# Patient Record
Sex: Male | Born: 1935 | Race: White | Hispanic: No | Marital: Married | Smoking: Former smoker
Health system: Southern US, Community
[De-identification: ages and names within clinical notes are randomized; demographics above are authoritative.]

## PROBLEM LIST (undated history)

## (undated) DIAGNOSIS — K635 Polyp of colon: Secondary | ICD-10-CM

## (undated) DIAGNOSIS — I679 Cerebrovascular disease, unspecified: Secondary | ICD-10-CM

## (undated) DIAGNOSIS — J449 Chronic obstructive pulmonary disease, unspecified: Secondary | ICD-10-CM

## (undated) DIAGNOSIS — I739 Peripheral vascular disease, unspecified: Secondary | ICD-10-CM

## (undated) DIAGNOSIS — E785 Hyperlipidemia, unspecified: Secondary | ICD-10-CM

## (undated) DIAGNOSIS — S065X9A Traumatic subdural hemorrhage with loss of consciousness of unspecified duration, initial encounter: Secondary | ICD-10-CM

## (undated) DIAGNOSIS — M1990 Unspecified osteoarthritis, unspecified site: Secondary | ICD-10-CM

## (undated) DIAGNOSIS — S065XAA Traumatic subdural hemorrhage with loss of consciousness status unknown, initial encounter: Secondary | ICD-10-CM

## (undated) DIAGNOSIS — Z9289 Personal history of other medical treatment: Secondary | ICD-10-CM

## (undated) DIAGNOSIS — I10 Essential (primary) hypertension: Secondary | ICD-10-CM

## (undated) HISTORY — DX: Peripheral vascular disease, unspecified: I73.9

## (undated) HISTORY — DX: Personal history of other medical treatment: Z92.89

## (undated) HISTORY — DX: Unspecified osteoarthritis, unspecified site: M19.90

## (undated) HISTORY — DX: Hyperlipidemia, unspecified: E78.5

## (undated) HISTORY — DX: Chronic obstructive pulmonary disease, unspecified: J44.9

## (undated) HISTORY — DX: Polyp of colon: K63.5

## (undated) HISTORY — DX: Traumatic subdural hemorrhage with loss of consciousness of unspecified duration, initial encounter: S06.5X9A

## (undated) HISTORY — DX: Traumatic subdural hemorrhage with loss of consciousness status unknown, initial encounter: S06.5XAA

## (undated) HISTORY — DX: Cerebrovascular disease, unspecified: I67.9

## (undated) HISTORY — DX: Essential (primary) hypertension: I10

---

## 1998-08-10 ENCOUNTER — Ambulatory Visit (HOSPITAL_COMMUNITY): Admission: RE | Admit: 1998-08-10 | Discharge: 1998-08-10 | Payer: Self-pay | Admitting: Family Medicine

## 2000-05-30 ENCOUNTER — Encounter: Payer: Self-pay | Admitting: Family Medicine

## 2000-05-30 ENCOUNTER — Encounter: Admission: RE | Admit: 2000-05-30 | Discharge: 2000-05-30 | Payer: Self-pay | Admitting: Family Medicine

## 2000-08-20 ENCOUNTER — Ambulatory Visit (HOSPITAL_BASED_OUTPATIENT_CLINIC_OR_DEPARTMENT_OTHER): Admission: RE | Admit: 2000-08-20 | Discharge: 2000-08-20 | Payer: Self-pay | Admitting: Surgery

## 2002-01-07 ENCOUNTER — Ambulatory Visit (HOSPITAL_COMMUNITY): Admission: RE | Admit: 2002-01-07 | Discharge: 2002-01-07 | Payer: Self-pay | Admitting: Urology

## 2002-01-07 ENCOUNTER — Encounter: Payer: Self-pay | Admitting: Urology

## 2003-05-11 ENCOUNTER — Encounter: Payer: Self-pay | Admitting: Urology

## 2003-05-13 ENCOUNTER — Encounter (INDEPENDENT_AMBULATORY_CARE_PROVIDER_SITE_OTHER): Payer: Self-pay | Admitting: Specialist

## 2003-05-13 ENCOUNTER — Inpatient Hospital Stay (HOSPITAL_COMMUNITY): Admission: RE | Admit: 2003-05-13 | Discharge: 2003-05-16 | Payer: Self-pay | Admitting: Urology

## 2003-10-17 ENCOUNTER — Encounter: Admission: RE | Admit: 2003-10-17 | Discharge: 2003-10-17 | Payer: Self-pay | Admitting: Family Medicine

## 2003-10-31 ENCOUNTER — Encounter: Admission: RE | Admit: 2003-10-31 | Discharge: 2003-10-31 | Payer: Self-pay | Admitting: Family Medicine

## 2004-01-27 ENCOUNTER — Encounter: Admission: RE | Admit: 2004-01-27 | Discharge: 2004-01-27 | Payer: Self-pay | Admitting: Family Medicine

## 2006-09-11 ENCOUNTER — Ambulatory Visit: Payer: Self-pay | Admitting: Cardiovascular Disease

## 2006-09-12 ENCOUNTER — Ambulatory Visit (HOSPITAL_COMMUNITY): Admission: RE | Admit: 2006-09-12 | Discharge: 2006-09-13 | Payer: Self-pay | Admitting: Cardiovascular Disease

## 2006-09-12 ENCOUNTER — Ambulatory Visit: Payer: Self-pay | Admitting: Cardiovascular Disease

## 2006-09-26 ENCOUNTER — Ambulatory Visit: Payer: Self-pay | Admitting: Cardiovascular Disease

## 2006-11-24 ENCOUNTER — Ambulatory Visit: Payer: Self-pay | Admitting: Cardiovascular Disease

## 2006-11-24 LAB — CONVERTED CEMR LAB
ALT: 35 units/L (ref 0–40)
Albumin: 3.7 g/dL (ref 3.5–5.2)
Alkaline Phosphatase: 66 units/L (ref 39–117)
LDL Cholesterol: 53 mg/dL (ref 0–99)
Total CHOL/HDL Ratio: 2.2
Triglycerides: 60 mg/dL (ref 0–149)
VLDL: 12 mg/dL (ref 0–40)

## 2007-03-12 ENCOUNTER — Ambulatory Visit: Payer: Self-pay | Admitting: Cardiovascular Disease

## 2007-09-21 ENCOUNTER — Ambulatory Visit: Payer: Self-pay | Admitting: Cardiovascular Disease

## 2007-11-19 ENCOUNTER — Ambulatory Visit: Payer: Self-pay | Admitting: Internal Medicine

## 2007-11-19 DIAGNOSIS — Z87442 Personal history of urinary calculi: Secondary | ICD-10-CM

## 2007-11-19 DIAGNOSIS — I259 Chronic ischemic heart disease, unspecified: Secondary | ICD-10-CM

## 2007-11-19 DIAGNOSIS — Z8546 Personal history of malignant neoplasm of prostate: Secondary | ICD-10-CM

## 2007-11-19 DIAGNOSIS — E785 Hyperlipidemia, unspecified: Secondary | ICD-10-CM | POA: Insufficient documentation

## 2007-12-07 ENCOUNTER — Ambulatory Visit: Payer: Self-pay | Admitting: Internal Medicine

## 2008-01-11 ENCOUNTER — Ambulatory Visit: Payer: Self-pay | Admitting: Cardiovascular Disease

## 2008-01-11 LAB — CONVERTED CEMR LAB
ALT: 25 units/L (ref 0–53)
AST: 27 units/L (ref 0–37)
Albumin: 3.9 g/dL (ref 3.5–5.2)
Alkaline Phosphatase: 57 units/L (ref 39–117)
Bilirubin, Direct: 0.2 mg/dL (ref 0.0–0.3)
Cholesterol: 131 mg/dL (ref 0–200)
Total Protein: 7.2 g/dL (ref 6.0–8.3)
Triglycerides: 43 mg/dL (ref 0–149)
VLDL: 9 mg/dL (ref 0–40)

## 2008-04-04 ENCOUNTER — Ambulatory Visit: Payer: Self-pay | Admitting: Cardiovascular Disease

## 2008-07-11 ENCOUNTER — Ambulatory Visit: Payer: Self-pay

## 2008-09-13 ENCOUNTER — Ambulatory Visit: Payer: Self-pay | Admitting: Cardiovascular Disease

## 2009-04-19 DIAGNOSIS — I10 Essential (primary) hypertension: Secondary | ICD-10-CM | POA: Insufficient documentation

## 2009-04-19 DIAGNOSIS — I209 Angina pectoris, unspecified: Secondary | ICD-10-CM

## 2009-04-24 ENCOUNTER — Ambulatory Visit: Payer: Self-pay | Admitting: Cardiovascular Disease

## 2009-05-01 ENCOUNTER — Ambulatory Visit: Payer: Self-pay | Admitting: Cardiovascular Disease

## 2009-05-01 LAB — CONVERTED CEMR LAB
Albumin: 3.8 g/dL (ref 3.5–5.2)
Bilirubin, Direct: 0.2 mg/dL (ref 0.0–0.3)
Cholesterol: 139 mg/dL (ref 0–200)
LDL Cholesterol: 74 mg/dL (ref 0–99)
Total CHOL/HDL Ratio: 3
Total Protein: 6.9 g/dL (ref 6.0–8.3)
VLDL: 12.2 mg/dL (ref 0.0–40.0)

## 2009-07-31 ENCOUNTER — Ambulatory Visit: Payer: Self-pay | Admitting: Cardiology

## 2009-07-31 ENCOUNTER — Telehealth: Payer: Self-pay | Admitting: Cardiovascular Disease

## 2009-07-31 ENCOUNTER — Inpatient Hospital Stay (HOSPITAL_COMMUNITY): Admission: EM | Admit: 2009-07-31 | Discharge: 2009-08-02 | Payer: Self-pay | Admitting: Emergency Medicine

## 2009-08-01 HISTORY — PX: CARDIAC CATHETERIZATION: SHX172

## 2009-08-15 ENCOUNTER — Encounter: Payer: Self-pay | Admitting: Cardiology

## 2009-08-16 ENCOUNTER — Encounter (INDEPENDENT_AMBULATORY_CARE_PROVIDER_SITE_OTHER): Payer: Self-pay | Admitting: *Deleted

## 2009-08-18 ENCOUNTER — Ambulatory Visit: Payer: Self-pay | Admitting: Cardiology

## 2009-08-18 ENCOUNTER — Encounter: Payer: Self-pay | Admitting: Nurse Practitioner

## 2009-08-18 DIAGNOSIS — R12 Heartburn: Secondary | ICD-10-CM

## 2009-08-24 ENCOUNTER — Encounter (HOSPITAL_COMMUNITY): Admission: RE | Admit: 2009-08-24 | Discharge: 2009-10-06 | Payer: Self-pay | Admitting: Cardiovascular Disease

## 2009-09-04 ENCOUNTER — Encounter: Payer: Self-pay | Admitting: Cardiovascular Disease

## 2009-09-13 ENCOUNTER — Encounter: Payer: Self-pay | Admitting: Cardiovascular Disease

## 2009-10-03 ENCOUNTER — Encounter: Payer: Self-pay | Admitting: Cardiovascular Disease

## 2009-10-07 ENCOUNTER — Encounter (HOSPITAL_COMMUNITY): Admission: RE | Admit: 2009-10-07 | Discharge: 2009-11-17 | Payer: Self-pay | Admitting: Cardiovascular Disease

## 2009-10-23 ENCOUNTER — Encounter: Payer: Self-pay | Admitting: Cardiovascular Disease

## 2009-11-08 ENCOUNTER — Encounter: Payer: Self-pay | Admitting: Cardiovascular Disease

## 2009-11-20 ENCOUNTER — Ambulatory Visit: Payer: Self-pay | Admitting: Cardiovascular Disease

## 2010-03-12 ENCOUNTER — Encounter: Payer: Self-pay | Admitting: Cardiovascular Disease

## 2010-03-12 ENCOUNTER — Encounter: Admission: RE | Admit: 2010-03-12 | Discharge: 2010-03-12 | Payer: Self-pay | Admitting: Family Medicine

## 2010-03-13 ENCOUNTER — Ambulatory Visit: Payer: Self-pay | Admitting: Cardiovascular Disease

## 2010-03-13 DIAGNOSIS — R079 Chest pain, unspecified: Secondary | ICD-10-CM | POA: Insufficient documentation

## 2010-03-15 ENCOUNTER — Telehealth (INDEPENDENT_AMBULATORY_CARE_PROVIDER_SITE_OTHER): Payer: Self-pay | Admitting: *Deleted

## 2010-03-19 ENCOUNTER — Encounter: Payer: Self-pay | Admitting: Cardiovascular Disease

## 2010-03-19 ENCOUNTER — Encounter (HOSPITAL_COMMUNITY): Admission: RE | Admit: 2010-03-19 | Discharge: 2010-04-03 | Payer: Self-pay | Admitting: Cardiovascular Disease

## 2010-03-19 ENCOUNTER — Ambulatory Visit: Payer: Self-pay | Admitting: Cardiovascular Disease

## 2010-03-19 ENCOUNTER — Ambulatory Visit: Payer: Self-pay

## 2010-03-21 ENCOUNTER — Ambulatory Visit: Payer: Self-pay | Admitting: Cardiovascular Disease

## 2010-03-21 ENCOUNTER — Ambulatory Visit (HOSPITAL_COMMUNITY): Admission: RE | Admit: 2010-03-21 | Discharge: 2010-03-21 | Payer: Self-pay | Admitting: Cardiovascular Disease

## 2010-03-21 HISTORY — PX: CARDIAC CATHETERIZATION: SHX172

## 2010-04-23 ENCOUNTER — Encounter: Payer: Self-pay | Admitting: Cardiovascular Disease

## 2010-09-18 ENCOUNTER — Ambulatory Visit: Payer: Self-pay | Admitting: Cardiovascular Disease

## 2010-09-18 ENCOUNTER — Encounter: Payer: Self-pay | Admitting: Cardiovascular Disease

## 2010-11-08 NOTE — Letter (Signed)
Summary: Cardiac Catheterization Instructions- Main Lab  [employer], Main Office  [address]   [place], [place] [address]   Phone: [phone]  Fax: [phone]     03/19/2010 MRN: [id number]  [name] Marks [address] [name], [place]  [address]  Dear [name] Marks,   You are scheduled for Cardiac Catheterization on Wednesday March 21, 2010              with Dr. Excell Seltzer.  Please arrive at the [facility] at 6:30      a.m. on the day of your procedure.  1. DIET     _X___ Nothing to eat or drink after midnight except your medications with a sip of water.  2. MAKE SURE YOU TAKE YOUR ASPIRIN AND PLAVIX.  3. __X__ YOU MAY TAKE ALL of your remaining medications with a small amount of water.       4. Plan for one night stay - bring personal belongings (i.e. toothpaste, toothbrush, etc.)  5. Bring a current list of your medications and current insurance cards.  6. Must have a responsible person to drive you home.   7. Someone must be with you for the first 24 hours after you arrive home.  8. Please wear clothes that are easy to get on and off and wear slip-on shoes.  *Special note: Every effort is made to have your procedure done on time.  Occasionally there are emergencies that present themselves at the hospital that may cause delays.  Please be patient if a delay does occur.  If you have any questions after you get home, please call the office at the number listed above.  Julieta Gutting, RN, BSN

## 2010-11-08 NOTE — Progress Notes (Signed)
Summary: Nuclear Pre-Procedure  Phone Note Outgoing Call Call back at cell [phone]   Call placed by: Stanton Kidney, EMT-P,  March 15, 2010 12:13 PM Call placed to: Patient Action Taken: Phone Call Completed Summary of Call: Reviewed information on Myoview Information Sheet (see scanned document for further details).  Spoke with Patient.    Nuclear Med Background Indications for Stress Test: Evaluation for Ischemia, Stent Patency   History: Heart Catheterization, Myocardial Perfusion Study, Stents  History Comments: 10/09 MPS: EF= 66%, (+) EKG, 10/10 Heart Cath > Stents: LAD x2  Symptoms: Chest Tightness    Nuclear Pre-Procedure Cardiac Risk Factors: Hypertension, Lipids Height (in): 70

## 2010-11-08 NOTE — Assessment & Plan Note (Signed)
Summary: rov/ atypical chestpain   Visit Type:  Follow-up Primary Provider:  Maryelizabeth Rowan  CC:  chest tightness.  History of Present Illness: 76 y/o cauc. male with h/o CAD, s/p PCI and DES to the mid LAD with placement of a 2.5 x 28mm Xience DES on 08/01/2009.  Previously placed distal LAD bms had minimal restenosis, while the distal RCA had an 80% discrete lesion that has been medically managed.   He denies exertional chest pain, but has had 3 recent episodes of nocturnal chest tightness, predominately in the central and right chest. These symptoms are similar to those of his prior angina. He has not taken NTG. Episodes have lasted at least 20 minutes each. However, he was able to mow his lawn with a self-propelled push mower this week without any chest pain or tightness. He has stable exertional dyspnea, no edema, lightheadedness or other complaints.  Current Medications (verified): 1)  Metoprolol Tartrate 25 Mg Tabs (Metoprolol Tartrate) .... Take One Tablet By Mouth Twice A Day 2)  Simvastatin 40 Mg Tabs (Simvastatin) .... Take One Tablet By Mouth Daily At Bedtime 3)  Aspirin 81 Mg Tbec (Aspirin) .... Take One Tablet By Mouth Daily 4)  Vitamin D3 1000 Unit Caps (Cholecalciferol) .... Take 1 Capsule By Mouth Once A Day 5)  Plavix 75 Mg Tabs (Clopidogrel Bisulfate) .... Take One Tablet By Mouth Daily 6)  Pepcid 20 Mg Tabs (Famotidine) .... Take 1 Tab By Mouth Twice Daily 7)  Citracal Plus  Tabs (Multiple Minerals-Vitamins) .... Take 1 Tablet By Mouth Two Times A Day 8)  Calcium 400mg  Tablet .... Take 1 Tablet By Mouth Two Times A Day  Allergies (verified): No Known Drug Allergies  Past History:  Past medical history reviewed for relevance to current acute and chronic problems.  Past Medical History: Reviewed history from 08/18/2009 and no changes required. ANGINA, CHRONIC (ICD-413.9)Class II CORONARY ARTERY DISEASE, S/P PTCA (ICD-414.9)      A.  s/p bms to distal LAD in 2007      B.  s/p Xience DES to mid LAD 07/2009.  Mimimal ISR in previously placed distal LAD stent @ time of cath.            80% distal RCA stenosis - med rx. HYPERTENSION (ICD-401.9) DYSLIPIDEMIA (ICD-272.4) NEPHROLITHIASIS, HX OF (ICD-V13.01) NEOPLASM, MALIGNANT, PROSTATE, HX OF (ICD-V10.46) DYSPEPSIA  Review of Systems       Negative except as per HPI   Vital Signs:  Patient profile:   76 year old male Height:      70 inches Weight:      213 pounds BMI:     30.67 Pulse rate:   67 / minute Pulse rhythm:   regular Resp:     18 per minute BP sitting:   132 / 68  (left arm) Cuff size:   large  Vitals Entered By: Vikki Ports (March 13, 2010 2:56 PM)  Physical Exam  General:  Pt is alert and oriented, in no acute distress. HEENT: normal Neck: normal carotid upstrokes without bruits, JVP normal Lungs: CTA CV: RRR without murmur or gallop Abd: soft, NT, positive BS, no bruit, no organomegaly Ext: no clubbing, cyanosis, or edema. peripheral pulses 2+ and equal Skin: warm and dry without rash    EKG  Procedure date:  03/13/2010  Findings:      NSR, within normal limits, HR 67 bpm.  Impression & Recommendations:  Problem # 1:  CHEST PAIN UNSPECIFIED (ICD-786.50) Pt with known CAD  and recurrent angina, but only episodic nocturnal pain. Uncertain if this is truly recurrent angina. Recommend exercise myoview stress study to rule out significant ischemia and further risk stratify. I think relook cath without a noninvasive study may be a bit premature.  His updated medication list for this problem includes:    Metoprolol Tartrate 25 Mg Tabs (Metoprolol tartrate) .Marland Kitchen... Take one tablet by mouth twice a day    Aspirin 81 Mg Tbec (Aspirin) .Marland Kitchen... Take one tablet by mouth daily    Plavix 75 Mg Tabs (Clopidogrel bisulfate) .Marland Kitchen... Take one tablet by mouth daily  Orders: EKG w/ Interpretation (93[address]) Nuclear Stress Test (Nuc Stress Test)  Problem # 2:  HYPERTENSION  (ICD-401.9) Stable, controlled.  His updated medication list for this problem includes:    Metoprolol Tartrate 25 Mg Tabs (Metoprolol tartrate) .Marland Kitchen... Take one tablet by mouth twice a day    Aspirin 81 Mg Tbec (Aspirin) .Marland Kitchen... Take one tablet by mouth daily  Orders: EKG w/ Interpretation (93000) Nuclear Stress Test (Nuc Stress Test)  BP today: 132/68 Prior BP: 110/60 (11/20/2009)  Labs Reviewed: Chol: 139 (05/01/2009)   HDL: 53.30 (05/01/2009)   LDL: 74 (05/01/2009)   TG: 61.0 (05/01/2009)  Problem # 3:  DYSLIPIDEMIA (ICD-272.4) Controlled. LDL last year at goal (see below). His updated medication list for this problem includes:    Simvastatin 40 Mg Tabs (Simvastatin) .Marland Kitchen... Take one tablet by mouth daily at bedtime  CHOL: 139 (05/01/2009)   LDL: 74 (05/01/2009)   HDL: 53.30 (05/01/2009)   TG: 61.0 (05/01/2009)  Patient Instructions: 1)  Your physician wants you to follow-up in: 6 MONTHS.  You will receive a reminder letter in the mail two months in advance. If you don't receive a letter, please call our office to schedule the follow-up appointment. 2)  Your physician has requested that you have an exercise stress myoview.  For further information please visit [email or web address]  Please follow instruction sheet, as given.

## 2010-11-08 NOTE — Assessment & Plan Note (Signed)
Summary: per check out   Visit Type:  6 months follow up Primary Provider:  Maryelizabeth Rowan  CC:  No cardiac complaints.  History of Present Illness: 76 y/o cauc. male with h/o CAD, s/p PCI and DES to the mid LAD with placement of a 2.5 x 28mm Xience DES on 08/01/2009.  Previously placed distal LAD bms had minimal restenosis, while the distal RCA had an 80% discrete lesion that has been medically managed.   He presents today for followup evaluation. Denies chest pain or pressure. He reports stable exertional dyspnea with raking leaves or doing yardwork. He exercises at least 5 days per week (does two separate 15 minute sessions). No edema, palpitations, or other complaints.   Current Medications (verified): 1)  Metoprolol Tartrate 25 Mg Tabs (Metoprolol Tartrate) .... Take One Tablet By Mouth Twice A Day 2)  Simvastatin 40 Mg Tabs (Simvastatin) .... Take One Tablet By Mouth Daily At Bedtime 3)  Aspirin 81 Mg Tbec (Aspirin) .... Take One Tablet By Mouth Daily 4)  Vitamin D3 1000 Unit Caps (Cholecalciferol) .... Take 1 Capsule By Mouth Once A Day 5)  Plavix 75 Mg Tabs (Clopidogrel Bisulfate) .... Take One Tablet By Mouth Daily 6)  Citracal Plus  Tabs (Multiple Minerals-Vitamins) .... Take 1 Tablet By Mouth Two Times A Day 7)  Calcium 400mg  Tablet .... Take 1 Tablet By Mouth Two Times A Day  Allergies (verified): No Known Drug Allergies  Past History:  Past medical history reviewed for relevance to current acute and chronic problems.  Past Medical History: Reviewed history from 08/18/2009 and no changes required. ANGINA, CHRONIC (ICD-413.9)Class II CORONARY ARTERY DISEASE, S/P PTCA (ICD-414.9)      A.  s/p bms to distal LAD in 2007      B.  s/p Xience DES to mid LAD 07/2009.  Mimimal ISR in previously placed distal LAD stent @ time of cath.            80% distal RCA stenosis - med rx. HYPERTENSION (ICD-401.9) DYSLIPIDEMIA (ICD-272.4) NEPHROLITHIASIS, HX OF (ICD-V13.01) NEOPLASM,  MALIGNANT, PROSTATE, HX OF (ICD-V10.46) DYSPEPSIA  Review of Systems       Negative except as per HPI   Vital Signs:  Patient profile:   76 year old male Height:      70 inches Weight:      218.25 pounds BMI:     31.43 Pulse rate:   54 / minute Pulse rhythm:   regular Resp:     18 per minute BP sitting:   140 / 72  (left arm) Cuff size:   large  Vitals Entered By: Vikki Ports (September 18, 2010 9:25 AM)  Physical Exam  General:  Pt is alert and oriented, in no acute distress. HEENT: normal Neck: normal carotid upstrokes without bruits, JVP normal Lungs: CTA CV: RRR without murmur or gallop Abd: soft, NT, positive BS, no bruit, no organomegaly Ext: no clubbing, cyanosis, or edema. peripheral pulses 2+ and equal Skin: warm and dry without rash    EKG  Procedure date:  09/18/2010  Findings:      Sinus bradycardia 54 bpm, within normal limits.  Impression & Recommendations:  Problem # 1:  CORONARY ARTERY DISEASE, S/P PTCA (ICD-414.9) Pt stable without angina. We reviewed risk:benefit for continued dual antiplatelet Rx after 12 months and decided to discontinue plavix. He is at low risk of late stent thrombosis as he was treated with a single DES (second generation) outside of the setting of ACS. Otherwise will  continue with current medical management.  The following medications were removed from the medication list:    Plavix 75 Mg Tabs (Clopidogrel bisulfate) .Marland Kitchen... Take one tablet by mouth daily His updated medication list for this problem includes:    Metoprolol Tartrate 25 Mg Tabs (Metoprolol tartrate) .Marland Kitchen... Take one tablet by mouth twice a day    Aspirin 81 Mg Tbec (Aspirin) .Marland Kitchen... Take one tablet by mouth daily  Problem # 2:  HYPERTENSION (ICD-401.9) Reports home BP's ranging around 135/80.Marland KitchenMarland KitchenI asked him to continue recording and bring readings into next visit. He will call if readings consistently above 140 mmHg.  His updated medication list for this problem  includes:    Metoprolol Tartrate 25 Mg Tabs (Metoprolol tartrate) .Marland Kitchen... Take one tablet by mouth twice a day    Aspirin 81 Mg Tbec (Aspirin) .Marland Kitchen... Take one tablet by mouth daily  BP today: 140/72 Prior BP: 132/68 (03/13/2010)  Labs Reviewed: Chol: 139 (05/01/2009)   HDL: 53.30 (05/01/2009)   LDL: 74 (05/01/2009)   TG: 61.0 (05/01/2009)  Problem # 3:  DYSLIPIDEMIA (ICD-272.4) Labs reviewed from July 2011: Total chol 142, LDL 67, HDL 65, Trig 52  His updated medication list for this problem includes:    Simvastatin 40 Mg Tabs (Simvastatin) .Marland Kitchen... Take one tablet by mouth daily at bedtime  CHOL: 139 (05/01/2009)   LDL: 74 (05/01/2009)   HDL: 53.30 (05/01/2009)   TG: 61.0 (05/01/2009)  Patient Instructions: 1)  Your physician recommends that you schedule a follow-up appointment in: 12 months 2)  Your physician has recommended you make the following change in your medication: Stop Plavix 3)  Check your blood pressure at home and keep a record of this.  Your goal blood pressure is less than 140/85

## 2010-11-08 NOTE — Miscellaneous (Signed)
Summary: [facility] Cardiac Progress Note  [facility]   Imported By: Roderic Ovens 11/22/2009 16:21:21  _____________________________________________________________________  External Attachment:    Type:   Image     Comment:   External Document

## 2010-11-08 NOTE — Letter (Signed)
Summary: [facility]  [facility]   Imported By: Kassie Mends 10/09/2009 08:25:55  _____________________________________________________________________  External Attachment:    Type:   Image     Comment:   External Document

## 2010-11-08 NOTE — Letter (Signed)
Summary: [facility]  [facility]   Imported By: Debby Freiberg 04/06/2010 09:21:44  _____________________________________________________________________  External Attachment:    Type:   Image     Comment:   External Document

## 2010-11-08 NOTE — Assessment & Plan Note (Signed)
Summary: Cardiology Nuclear Study  Nuclear Med Background Indications for Stress Test: Evaluation for Ischemia, Stent Patency   History: Heart Catheterization, Myocardial Perfusion Study, Stents  History Comments: 10/09 MPS: EF= 66%, (+) EKG, 10/10 Heart Cath > Stents: LAD x2  Symptoms: Chest Tightness, SOB    Nuclear Pre-Procedure Cardiac Risk Factors: Hypertension, Lipids Caffeine/Decaff Intake: None NPO After: 7:00 PM Lungs: clear IV 0.9% NS with Angio Cath: 20g     IV Site: (R) AC IV Started by: Stanton Kidney EMT-P Chest Size (in) 46     Height (in): 70 Weight (lb): 210 BMI: 30.24 Tech Comments: Metoprolol last taken @ 8 pm last night, per Patient.  Nuclear Med Study 1 or 2 day study:  1 day     Stress Test Type:  Stress Reading MD:  Charlton Haws, MD     Referring MD:  M.Cooper Resting Radionuclide:  Technetium 68m Tetrofosmin     Resting Radionuclide Dose:  10.6 mCi  Stress Radionuclide:  Technetium 25m Tetrofosmin     Stress Radionuclide Dose:  33 mCi   Stress Protocol Exercise Time (min):  8:01 min     Max HR:  144 bpm     Predicted Max HR:  147 bpm  Max Systolic BP: 189 mm Hg     Percent Max HR:  97.96 %     METS: 10.20 Rate Pressure Product:  72536    Stress Test Technologist:  Milana Na EMT-P     Nuclear Technologist:  Domenic Polite CNMT  Rest Procedure  Myocardial perfusion imaging was performed at rest 45 minutes following the intravenous administration of Myoview Technetium 35m Tetrofosmin.  Stress Procedure  The patient exercised for 8:01. The patient stopped due to fatigue, sob, and chest tightness.  There were + significant ST-T wave changes.  Myoview was injected at peak exercise and myocardial perfusion imaging was performed after a brief delay.  QPS Raw Data Images:  Normal; no motion artifact; normal heart/lung ratio. Stress Images:  NI: Uniform and normal uptake of tracer in all myocardial segments. Rest Images:  Normal homogeneous uptake  in all areas of the myocardium. Subtraction (SDS):  Normal Transient Ischemic Dilatation:  .96  (Normal <1.22)  Lung/Heart Ratio:  .35  (Normal <0.45)  Quantitative Gated Spect Images QGS EDV:  84 ml QGS ESV:  27 ml QGS EF:  68 % QGS cine images:  normal  Findings Low risk nuclear study Clinically Abnormal (chest pain, ST abnormality, hypotension)      Overall Impression  Exercise Capacity: Fair exercise capacity. BP Response: Normal blood pressure response. Clinical Symptoms: SOB and SSCP ECG Impression: Positive ECG at peak stress with 1.70mm ST segment depression in lateral leads Overall Impression: Normal nulcear images and positive ECG  Appended Document: Cardiology Nuclear Study Reivewed with patient. Because of clinical symptoms and abnormal EKG response with significant ST depression, I have recommended cardiac cath. Risks and indications reviewed with Mr Trela and his wife and they agree to proceed.

## 2010-11-08 NOTE — Miscellaneous (Signed)
Summary: [facility]  [facility]   Imported By: Roderic Ovens 12/13/2009 14:00:19  _____________________________________________________________________  External Attachment:    Type:   Image     Comment:   External Document

## 2010-11-08 NOTE — Miscellaneous (Signed)
Summary: [facility]  [facility] Progress [facility]   Imported By: Roderic Ovens 10/31/2009 12:08:34  _____________________________________________________________________  External Attachment:    Type:   Image     Comment:   External Document

## 2010-11-08 NOTE — Assessment & Plan Note (Signed)
Summary: f1y   Visit Type:  1 year follow up Primary Provider:  Maryelizabeth Rowan  CC:  No complaints.  History of Present Illness: 76 y/o cauc. male with h/o CAD, s/p PCI and DES to the mid LAD with placement of a 2.5 x 28mm Xience DES on 08/01/2009.  Previously placed distal LAD bms had minimal restenosis, while the distal RCA had an 80% discrete lesion that has been medically managed.   The patient has been doing very well since his PCI procedure. He denies chest pain, dyspnea, orthopnea, PND, edema, palpitations, lightheadedness, or syncope. He has completed cardiac rehab and continues with his exercise program. He has no exertional symptoms.   Current Medications (verified): 1)  Metoprolol Tartrate 25 Mg Tabs (Metoprolol Tartrate) .... Take One Tablet By Mouth Twice A Day 2)  Simvastatin 40 Mg Tabs (Simvastatin) .... Take One Tablet By Mouth Daily At Bedtime 3)  Aspirin Ec 325 Mg Tbec (Aspirin) .... Take One Tablet By Mouth Daily 4)  Vitamin D3 1000 Unit Caps (Cholecalciferol) .... Take 1 Capsule By Mouth Once A Day 5)  Plavix 75 Mg Tabs (Clopidogrel Bisulfate) .... Take One Tablet By Mouth Daily 6)  Pepcid 20 Mg Tabs (Famotidine) .... Take 1 Tab By Mouth Twice Daily 7)  Citracal Plus  Tabs (Multiple Minerals-Vitamins) .... Take 1 Tablet By Mouth Two Times A Day 8)  Vitamin D 50,000 Iu .... Once A Week For 8 Weeks  Allergies (verified): No Known Drug Allergies  Past History:  Past medical history reviewed for relevance to current acute and chronic problems.  Past Medical History: Reviewed history from 08/18/2009 and no changes required. ANGINA, CHRONIC (ICD-413.9)Class II CORONARY ARTERY DISEASE, S/P PTCA (ICD-414.9)      A.  s/p bms to distal LAD in 2007      B.  s/p Xience DES to mid LAD 07/2009.  Mimimal ISR in previously placed distal LAD stent @ time of cath.            80% distal RCA stenosis - med rx. HYPERTENSION (ICD-401.9) DYSLIPIDEMIA (ICD-272.4) NEPHROLITHIASIS,  HX OF (ICD-V13.01) NEOPLASM, MALIGNANT, PROSTATE, HX OF (ICD-V10.46) DYSPEPSIA  Review of Systems       Negative except as per HPI   Vital Signs:  Patient profile:   76 year old male Height:      70 inches Weight:      214 pounds BMI:     30.82 Pulse rate:   60 / minute Pulse rhythm:   regular Resp:     18 per minute BP sitting:   110 / 60  (left arm) Cuff size:   large  Vitals Entered By: Vikki Ports (November 20, 2009 12:01 PM)  Physical Exam  General:  Pt is alert and oriented, in no acute distress. HEENT: normal Neck: normal carotid upstrokes without bruits, JVP normal Lungs: CTA CV: RRR without murmur or gallop Abd: soft, NT, positive BS, no bruit, no organomegaly Ext: no clubbing, cyanosis, or edema. peripheral pulses 2+ and equal Skin: warm and dry without rash    Impression & Recommendations:  Problem # 1:  CORONARY ARTERY DISEASE, S/P PTCA (ICD-414.9) Stable without angina. Doing very well on current medical program. Decrease ASA to 81 mg daily and continue plavix. Follow-up 6 months. His updated medication list for this problem includes:    Metoprolol Tartrate 25 Mg Tabs (Metoprolol tartrate) .Marland Kitchen... Take one tablet by mouth twice a day    Aspirin 81 Mg Tbec (Aspirin) .Marland Kitchen... Take one  tablet by mouth daily    Plavix 75 Mg Tabs (Clopidogrel bisulfate) .Marland Kitchen... Take one tablet by mouth daily  Problem # 2:  HYPERTENSION (ICD-401.9) BP well-controlled on current Rx.  His updated medication list for this problem includes:    Metoprolol Tartrate 25 Mg Tabs (Metoprolol tartrate) .Marland Kitchen... Take one tablet by mouth twice a day    Aspirin 81 Mg Tbec (Aspirin) .Marland Kitchen... Take one tablet by mouth daily  BP today: 110/60 Prior BP: 120/60 (08/18/2009)  Labs Reviewed: Chol: 139 (05/01/2009)   HDL: 53.30 (05/01/2009)   LDL: 74 (05/01/2009)   TG: 61.0 (05/01/2009)  Problem # 3:  DYSLIPIDEMIA (ICD-272.4) Lipids have been at goal on simvastatin. Dr Duanne Guess is following labs  regularly.  His updated medication list for this problem includes:    Simvastatin 40 Mg Tabs (Simvastatin) .Marland Kitchen... Take one tablet by mouth daily at bedtime  CHOL: 139 (05/01/2009)   LDL: 74 (05/01/2009)   HDL: 53.30 (05/01/2009)   TG: 61.0 (05/01/2009)  Patient Instructions: 1)  Your physician has recommended you make the following change in your medication: DECREASE Aspirin to 81mg  once a day 2)  Your physician wants you to follow-up in:   6 MONTHS. You will receive a reminder letter in the mail two months in advance. If you don't receive a letter, please call our office to schedule the follow-up appointment. Prescriptions: PLAVIX 75 MG TABS (CLOPIDOGREL BISULFATE) Take one tablet by mouth daily  #90 x 4   Entered by:   Julieta Gutting, RN, BSN   Authorized by:   Norva Karvonen, MD   Signed by:   Julieta Gutting, RN, BSN on 11/20/2009   Method used:   Electronically to        [facility] (retail)       [address]       [place], [place]  [address]       Ph: [phone] or [phone]       Fax: [phone]   RxID:   [id number] SIMVASTATIN 40 MG TABS (SIMVASTATIN) Take one tablet by mouth daily at bedtime  #90 x 4   Entered by:   Julieta Gutting, RN, BSN   Authorized by:   Norva Karvonen, MD   Signed by:   Julieta Gutting, RN, BSN on 11/20/2009   Method used:   Electronically to        [facility] (retail)       [address]       [place], [place]  [address]       Ph: [phone] or [phone]       Fax: [phone]   RxID:   [id number]

## 2010-11-08 NOTE — Cardiovascular Report (Signed)
Summary: Pre-Cath Orders  Pre-Cath Orders   Imported By: Debby Freiberg 03/30/2010 12:33:01  _____________________________________________________________________  External Attachment:    Type:   Image     Comment:   External Document

## 2010-12-23 LAB — PROTIME-INR: Prothrombin Time: 13 seconds (ref 11.6–15.2)

## 2010-12-23 LAB — CBC
HCT: 44.3 % (ref 39.0–52.0)
Hemoglobin: 15.1 g/dL (ref 13.0–17.0)
MCHC: 34.1 g/dL (ref 30.0–36.0)
Platelets: 213 10*3/uL (ref 150–400)
RDW: 12.9 % (ref 11.5–15.5)

## 2010-12-23 LAB — BASIC METABOLIC PANEL
CO2: 29 mEq/L (ref 19–32)
Chloride: 101 mEq/L (ref 96–112)
Sodium: 135 mEq/L (ref 135–145)

## 2011-01-10 LAB — COMPREHENSIVE METABOLIC PANEL
Albumin: 3.7 g/dL (ref 3.5–5.2)
BUN: 16 mg/dL (ref 6–23)
Creatinine, Ser: 0.83 mg/dL (ref 0.4–1.5)
Total Protein: 6.5 g/dL (ref 6.0–8.3)

## 2011-01-10 LAB — BASIC METABOLIC PANEL
BUN: 13 mg/dL (ref 6–23)
BUN: 16 mg/dL (ref 6–23)
CO2: 24 mEq/L (ref 19–32)
Calcium: 8.4 mg/dL (ref 8.4–10.5)
Calcium: 9.1 mg/dL (ref 8.4–10.5)
Chloride: 108 mEq/L (ref 96–112)
Creatinine, Ser: 0.88 mg/dL (ref 0.4–1.5)
Creatinine, Ser: 0.98 mg/dL (ref 0.4–1.5)
GFR calc Af Amer: >60 mL/min (ref >60)
GFR calc Af Amer: >60 mL/min (ref >60)

## 2011-01-10 LAB — CBC
HCT: 39.8 % (ref 39.0–52.0)
HCT: 43.7 % (ref 39.0–52.0)
Hemoglobin: 13.5 g/dL (ref 13.0–17.0)
Hemoglobin: 13.9 g/dL (ref 13.0–17.0)
MCHC: 34.3 g/dL (ref 30.0–36.0)
MCHC: 34.4 g/dL (ref 30.0–36.0)
MCHC: 34.8 g/dL (ref 30.0–36.0)
MCV: 97.5 fL (ref 78.0–100.0)
MCV: 97.7 fL (ref 78.0–100.0)
MCV: 98.3 fL (ref 78.0–100.0)
Platelets: 164 10*3/uL (ref 150–400)
RBC: 3.88 MIL/uL — ABNORMAL LOW (ref 4.22–5.81)
RBC: 4.01 MIL/uL — ABNORMAL LOW (ref 4.22–5.81)
RBC: 4.08 MIL/uL — ABNORMAL LOW (ref 4.22–5.81)
RBC: 4.48 MIL/uL (ref 4.22–5.81)
RDW: 13.1 % (ref 11.5–15.5)
RDW: 13.3 % (ref 11.5–15.5)
RDW: 13.5 % (ref 11.5–15.5)
WBC: 8.9 10*3/uL (ref 4.0–10.5)

## 2011-01-10 LAB — LIPID PANEL
Cholesterol: 119 mg/dL (ref 0–200)
HDL: 53 mg/dL (ref >39)
LDL Cholesterol: 56 mg/dL (ref 0–99)
Total CHOL/HDL Ratio: 2.2 RATIO

## 2011-01-10 LAB — CK TOTAL AND CKMB (NOT AT ARMC)
CK, MB: 2.4 ng/mL (ref 0.3–4.0)
Relative Index: INVALID (ref 0.0–2.5)

## 2011-01-10 LAB — CARDIAC PANEL(CRET KIN+CKTOT+MB+TROPI)
CK, MB: 1.5 ng/mL (ref 0.3–4.0)
Relative Index: INVALID (ref 0.0–2.5)

## 2011-01-10 LAB — AMYLASE: Amylase: 94 U/L (ref 27–131)

## 2011-01-10 LAB — TROPONIN I: Troponin I: 0.01 ng/mL (ref 0.00–0.06)

## 2011-01-10 LAB — POCT CARDIAC MARKERS
CKMB, poc: <1 ng/mL — ABNORMAL LOW (ref 1.0–8.0)
Troponin i, poc: <0.05 ng/mL (ref 0.00–0.09)

## 2011-01-10 LAB — PROTIME-INR: INR: 1.07 (ref 0.00–1.49)

## 2011-01-22 ENCOUNTER — Encounter (INDEPENDENT_AMBULATORY_CARE_PROVIDER_SITE_OTHER): Payer: Self-pay | Admitting: *Deleted

## 2011-01-24 ENCOUNTER — Encounter: Payer: Self-pay | Admitting: Cardiovascular Disease

## 2011-02-19 NOTE — Assessment & Plan Note (Signed)
[facility]                            CARDIOLOGY OFFICE NOTE   NAME:[name] Marks                     MRN:          [id number]  DATE:04/04/2008                            DOB:          11-17-1934    [name] Marks was seen in followup at the [facility] Cardiology Office on  April 04, 2008.  He is a 76 year old gentleman with coronary artery  disease and chronic stable angina.  [name] Marks initially presented  with exertional dyspnea and chest pressure.  He underwent diagnostic  catheterization in December of 2007, which showed high-grade mid-LAD  stenosis.  He is treated with a bare-metal stent because his vessel was  too small for a drug-eluting stent.  He did not have much of a change in  his symptoms following stenting.  His LV function has been preserved  with an LVEF of 65%.   From a symptomatic standpoint, [name] Marks remained stable.  He  continues to have exertional dyspnea and chest tightness when he gets in  a hurry.  He experiences the symptoms with mowing his lawn when he has  to push the mower up a hill or when he walks uphill on a golf course.  Otherwise with normal level activity.  He is asymptomatic.  He denies  orthopnea, PND, edema, palpitations, lightheadedness, syncope, or other  complaints.   CURRENT MEDICATIONS:  1. Aspirin 325 mg daily.  2. Simvastatin 40 mg at bedtime.  3. Metamucil daily.   ALLERGIES:  NKDA.   PHYSICAL EXAMINATION:  GENERAL: The patient is alert and oriented.  He  is in no acute distress.  VITAL SIGNS:  Weights 211 pounds, blood pressure 144/93, and heart rates  recorded at 97, but on his EKG the heart rate is 65.  HEENT:  Normal.  NECK:  Normal carotid upstrokes without bruits.  Jugulovenous pressure  is normal.  LUNGS:  Clear to auscultation bilaterally.  HEART:  The apex is discrete nondisplaced.  There is no right  ventricular heave or lift.  HEART:  Regular rate and rhythm without murmurs or  gallops.  ABDOMEN:  Soft, nontender, and no organomegaly.  No abdominal bruits.  EXTREMITIES:  No clubbing, cyanosis or edema.  Peripheral pulses 2+ and  equal throughout.   EKG shows normal sinus rhythm with sinus arrhythmia   ASSESSMENT:  1. Coronary artery disease with stable exertional angina.  Mr.      Marks is never undergone stress testing.  His symptoms are      fairly typical for exertional angina.  I would like now asked him      to undergo an exercise Myoview stress study to evaluate his      ischemic burden.  Of note at times catheterization, he had minimal      disease in the right coronary artery and left circumflex, and he      had severe distal left anterior descending coronary artery stenosis      that was treated with stenting.  There is no other significant      disease noted.  I have also asked him to  start metoprolol 25 mg      b.i.d. to reduce angina and treat hypertension.  He is asked to      start this on medication after his Myoview scan.  Otherwise, we      will continue current therapy without changes.  2. Hypertension.  This diagnosis previously, but he has had      consecutive office visits now with systolic blood pressure greater      than 140.  As above, we will start metoprolol 25 mg b.i.d.  3. Dyslipidemia.  He remains on simvastatin 40 mg.  Lipids are      excellent with total cholesterol of 131, HDL of 55 and LDL of 68.   FOLLOWUP:  I would like to see [name] Marks back in 6 months.     Veverly Fells. Excell Seltzer, MD  Electronically Signed    MDC/MedQ  DD: 04/04/2008  DT: 04/04/2008  Job #: [id number]   cc:   Quita Skye. Artis Flock, M.D.

## 2011-02-19 NOTE — Assessment & Plan Note (Signed)
[facility]                            CARDIOLOGY OFFICE NOTE   NAME:Graf, DEO MEHRINGER                     MRN:          [id number]  DATE:09/13/2008                            DOB:          [birth year]    REASON FOR VISIT:  Coronary artery disease with stable angina.   HISTORY OF PRESENT ILLNESS:  [name] Marks is a 76 year old gentleman  with class II chronic stable angina.  He underwent stenting of a high-  grade mid-LAD stenosis back in December 2007.  He was treated with a  bare-metal stent because of small vessel size.  His main presenting  symptom was exertional dyspnea with a component of exertional chest  pressure, but the dyspnea was much more pronounced.  He really did not  have a good symptomatic response to stenting.  His LVEF has been  preserved at 65%.   He was started on a beta-blocker at the time of his last visit and has  significantly improved with beta blockade.  He is now able to do yard  work without any symptoms of shortness of breath or chest pain.  He  maintains an active lifestyle and has no complaints today.  He  specifically denies chest pain, dyspnea, palpitations or edema.   CURRENT MEDICATIONS:  1. Aspirin 325 mg daily.  2. Simvastatin 40 mg at bedtime.  3. Metamucil daily.  4. Metoprolol tartrate 25 mg b.i.d.  5. Vitamin D.   ALLERGIES:  NKDA   PHYSICAL EXAMINATION:  GENERAL:  The patient is alert and oriented.  He  is in no acute distress.  VITAL SIGNS:  Weight 214 pounds, blood pressure 140/78, heart rate 55,  respiratory rate 12.  HEENT:  Normal.  NECK:  Normal carotid upstrokes.  No bruits.  JVP normal.  LUNGS:  Clear bilaterally.  HEART:  Bradycardic and regular.  No murmurs or gallops.  ABDOMEN:  Soft, nontender.  No organomegaly.  EXTREMITIES:  No clubbing, cyanosis, or edema.  Peripheral pulses are  intact and equal throughout.   DIAGNOSTIC STUDIES:  EKG shows sinus bradycardia and is otherwise  within  normal limits.  Myoview stress scan done on July 11, 2008 showed  normal perfusion.  However, the patient did have chest pain and EKG  changes.   ASSESSMENT:  1. Coronary artery disease.  His recent Myoview scan was negative.      However, he has had symptoms that are typical of angina.  He had a      nice response to the addition of a beta-blocker.  We will continue      his current medical program without changes.  He is well beta-      blocked with a heart rate of 55.  There were no changes made in his      medical regimen today.  Encouraged continued exercise.  2. Dyslipidemia.  He remains on simvastatin.  His lipids on January 11, 2008 showed a cholesterol of 131, HDL of 55, LDL of 68.  He has an      appointment with Dr. Artis Flock next  month and will have repeat lipids      drawn at that time.   For followup, I would like to see Mr. Zentner back in 6 months.     Veverly Fells. Excell Seltzer, MD  Electronically Signed    MDC/MedQ  DD: 09/15/2008  DT: 09/15/2008  Job #: [id number]   cc:   Quita Skye. Artis Flock, M.D.

## 2011-02-19 NOTE — Assessment & Plan Note (Signed)
[facility]                            CARDIOLOGY OFFICE NOTE   NAME:Hagemeister, ESPEN BETHEL                     MRN:          [id number]  DATE:09/21/2007                            DOB:          [birth year]    Gregrey Bloyd was seen in followup at the [facility] Cardiology Office on  September 21, 2007.  Mr. Valdes is a 75 year old gentleman with  coronary artery disease.  He initially presented with exertional chest  pressure and dyspnea.  Diagnostic catheterization was performed which  showed high grade mid-LAD stenosis and he underwent stenting of the LAD  with a bare metal stent.  His LV function has been normal with an LVEF  of 65%.   Mr. Selmer continues to have a degree of exertional dyspnea.  His  symptom occur when walking briskly up an incline or doing fairly heavy  work.  With normal level activities he has no symptoms.  He really did  not appreciate much improvement after the stenting of his LAD was  performed.  He denies chest pain, orthopnea, PND, edema, palpitations or  other complaints.   CURRENT MEDICINES:  1. Aspirin 325 mg daily.  2. Lipitor 40 mg at bedtime.   ALLERGIES:  NKDA.   EXAMINATION:  The patient is alert and oriented, he is in no acute  distress.  Weight is 216, blood pressure 144/80, on my recheck blood  pressure was 144/76, heart rate 64, respiratory rate 16.  HEENT:  Normal.  NECK:  Normal carotid upstrokes without bruits, jugular venous pressure  normal.  LUNGS:  Clear to auscultation bilaterally.  HEART:  Regular rate and rhythm without murmurs or gallops.  ABDOMEN:  Soft, nontender, no organomegaly.  EXTREMITIES:  No clubbing, cyanosis or edema.  Peripheral pulses 2+ and  equal throughout.   EKG shows normal sinus rhythm and is within normal limits.   ASSESSMENT:  1. Coronary artery disease.  Status post left anterior descending      stenting.  Continue aspirin and statin therapy.  I encouraged      regular  exercise.  I would like to see Mr. Enderle back in 6      months.  2. Dyslipidemia.  Mr. Carpenter has a $50 copay with Lipitor.  His      lipids are outstanding with a total cholesterol of 118, HDL 53 and      LDL 53.  I have switched him from Lipitor to generic simvastatin      since this is more affordable.  I would like to repeat his lipids      and liver function tests in 3 months to make sure that his lipids      are still at goal with the change in therapy.   For followup I would like to see Mr. Dauphin back in 6 months.  He will  see Dr. Artis Flock next month for his annual physical.     Veverly Fells. Excell Seltzer, MD  Electronically Signed    MDC/MedQ  DD: 09/21/2007  DT: 09/21/2007  Job #: [id number]   cc:   Veverly Fells. Vernie Ammons,  M.D.  Quita Skye. Artis Flock, M.D.

## 2011-02-19 NOTE — Assessment & Plan Note (Signed)
[facility]                            CARDIOLOGY OFFICE NOTE   NAME:Ruff, MALIKHI OGAN                     MRN:          [id number]  DATE:03/12/2007                            DOB:          12-11-1934    Gerome Sam returned for followup at the [facility] Cardiology office as  an outpatient on March 12, 2007.  He is a 76 year old man who was  initially seen in December for typical exertional angina.  He underwent  a diagnostic catheterization on December 7th, which demonstrated high  grade stenosis in the mid LAD.  He really did not have any other  significant angiographic disease, and he had preserved left ventricular  function with an EF of 65%.  He underwent stenting of the mid LAD with a  bare metal stent due to the small size of that vessel.  Since that time  he has done well from a symptomatic standpoint.   At the time of initial presentation, his main symptom was exertional  dyspnea but he also felt chest pressure.  Currently, he has occasional  exertional dyspnea but this only occurs with moderate level activity  such as walking up hill.  He has not had any further chest pain or  pressure.  He denies nausea, vomiting, palpitations, diaphoresis,  orthopnea, PND, edema, lightheadedness, or syncope.  Overall, he feels  good.   CURRENT MEDICINES:  1. Aspirin 325 mg daily.  2. Lipitor 40 mg at bedtime.   ALLERGIES:  NKDA.   PHYSICAL EXAMINATION:  VITAL SIGNS:  Weight is 209 pounds, blood  pressure 124/64, heart rate 64.  GENERAL:  The patient is alert and oriented in no acute distress.  HEENT:  Normal.  NECK:  Normal carotid upstrokes without bruits.  Jugular venous pressure  is normal.  LUNGS:  Clear to auscultation bilaterally.  HEART:  Regular rate and rhythm without murmurs or gallops.  ABDOMEN:  Soft nontender.  No organomegaly.  EXTREMITIES:  No clubbing, cyanosis, or edema.  Peripheral pulses are 2+  and equal throughout.   EKG  shows normal sinus rhythm and is within normal limits.   ASSESSMENT:  Mr. Horger is currently stable from a cardiovascular  standpoint with regard to his coronary artery disease.   He had single vessel disease and has undergone bare metal stenting to  the mid left anterior descending artery.  Symptomatically, he is  improved.  He continues to maintain an active lifestyle which I again  encouraged today.  His lipids are under good control with the most  recent check in February showing a total cholesterol of 118 with  triglycerides of 60, HDL of 53, and LDL of 53.  His LFTs were within  normal limits.  He should be maintained on Lipitor 40 mg daily, a  prescription was written today for this medicine.   I would like to see Mr. Hellen back in 6 months or sooner if any new  problems arise.     Veverly Fells. Excell Seltzer, MD  Electronically Signed    MDC/MedQ  DD: 03/12/2007  DT: 03/12/2007  Job #: [id number]  cc:   Quita Skye. Artis Flock, M.D.

## 2011-02-19 NOTE — Assessment & Plan Note (Signed)
[place] [facility]                         GASTROENTEROLOGY OFFICE NOTE   NAME:Marks, [name] COLINA                     MRN:          [id number]  DATE:11/19/2007                            DOB:          12-Oct-1934    REFERRING PHYSICIAN:  Quita Skye. Artis Flock, M.D.   [name] Marks was referred for a direct screening colonoscopy.  He was  sent to see me because he had a history of cardiac stent.  Since he is  not on Plavix or aspirin, he does not really need any particular  consultation.  He was seen, but not charged for this visit.   He had no active GI complaints.   PAST MEDICAL HISTORY:  1. Prostate cancer, 2004.  2. Hernia surgery 12 years ago.  3. Hemorrhoidectomy 12 years ago.  4. Coronary artery disease with bare metal stent, 2007, on aspirin,      not on Plavix.  5. Remote history of nephrolithiasis.  6. Dyslipidemia.   MEDICATIONS:  Aspirin, simvastatin and Metamucil.   No known drug allergies.   FAMILY HISTORY, SOCIAL HISTORY, REVIEW OF SYSTEMS:  Reflected in my  medical history form.   Height 5 feet 10, weight 214 pounds, blood pressure 130/62.   He will be scheduled for a screening colonoscopy.  We will continue his  aspirin per our typical protocol.  Colonoscopy is scheduled for December 07, 2007.     Iva Boop, MD,FACG  Electronically Signed    CEG/MedQ  DD: 11/19/2007  DT: 11/20/2007  Job #: [id number]

## 2011-02-22 NOTE — Assessment & Plan Note (Signed)
[facility]                            CARDIOLOGY OFFICE NOTE   NAME:Marks, [name] MENDIBLES                     MRN:          [id number]  DATE:09/26/2006                            DOB:          [birth year]    [name] Marks presents today for followup after PTCA and bare metal  stenting of his mid LAD on September 12, 2006. He was initially evaluated  on September 11, 2006 for classic anginal symptoms with exertion. His  cardiac catheterization demonstrated normal left ventricular function  and single vessel disease involving the mid LAD. The LAD out in the mid  and distal portion was a very small diameter vessel and so I used a bare  metal stent to treat a high-grade focal stenosis in that region. The  patient had a good angiographic result and uneventful post-procedure  hospital course. He was discharged home the following day.   Since his return home he has done very well. His symptoms of dyspnea  have greatly improved. He has been out working in the yard and walking  regularly and his dyspnea has improved. He remembers one episode of  minor chest pain since the procedure but this was transient in nature.  He has no other complaints at this time.   CURRENT MEDICATIONS:  Include Plavix 75 mg daily, aspirin 325 mg daily  and Lipitor 40 mg daily.   PHYSICAL EXAMINATION:  GENERAL: On exam today he is alert and oriented,  in no acute distress.  VITAL SIGNS: His weight is 212 pounds, blood pressure is 132/74, heart  rate 69, respiratory rate is 12.  HEENT: Normal.  NECK: Normal carotid upstrokes without bruits, jugular venous pressure  is normal.  LUNGS: Clear to auscultation bilaterally.  CARDIOVASCULAR: The apex is discreet and non displaced. Regular rate and  rhythm without murmurs or gallops.  ABDOMEN: Soft, nontender, no organomegaly.  EXTREMITIES:  No clubbing, cyanosis or edema. Peripheral pulses are 2+  and equal throughout. Right femoral access  site was examined and there  is minor ecchymosis present. There is no tenderness or mass.   EKG demonstrates normal sinus rhythm and is within normal limits.   ASSESSMENT:  [name] Marks is a 76 year old gentleman with single vessel  coronary artery disease status post recent PCI with a bare metal stent.  He has clearly had symptomatic improvement since his procedure. He  should continue on life long aspirin. Clopidogrel can be discontinued  after 30 days. He also should be continued on life long atorvastatin as  tolerated. Will schedule him for follow up lipids and liver function  tests in 8 weeks.   I plan on seeing [name] Marks back in 6 months for regular follow-up. He  is scheduled for his yearly physical exam with Dr. Artis Marks next month.     [name] Fells. Excell Seltzer, MD  Electronically Signed    MDC/MedQ  DD: 09/26/2006  DT: 09/27/2006  Job #: [id number]   cc:   [name] Marks. [name] Marks, M.D.

## 2011-02-22 NOTE — Cardiovascular Report (Signed)
NAME:  [name] Marks, [name] Marks NO.:  [id number]   MEDICAL RECORD NO.:  [id number]          PATIENT TYPE:  OIB   LOCATION:  2807                         FACILITY:  [facility]   PHYSICIAN:  Veverly Fells. Excell Seltzer, MD  DATE OF BIRTH:  [birth year]   DATE OF PROCEDURE:  09/12/2006  DATE OF DISCHARGE:                            CARDIAC CATHETERIZATION   PROCEDURE:  Left heart catheterization, selective coronary angiography,  left ventricular angiography, percutaneous transluminal coronary  angioplasty and stenting of the mid left anterior descending  and Angio-  Seal the right femoral artery.   INDICATIONS:  [name] Marks is a very pleasant 77 year old male who  presented for evaluation yesterday for  classic symptoms of stable  angina, CCS class III.  He reports shortness of breath and a dull ache  in his chest with activity such as walking up a grade or raking leaves.  At times, his symptoms come on with minimal activity.  He has not had  any rest symptoms.  We discussed the risks and indications and agreed to  proceed with a diagnostic catheterization in the setting of his classic  symptoms as well as his very strong family history of coronary artery  disease.   PROCEDURAL DETAILS:  Risks and indications of the procedure explained in  detail to the patient.  Informed consent was obtained.  The right groin  was prepped, draped and anesthetized with 1% lidocaine.  A 6-French  sheath was placed in the right femoral artery using a front wall  puncture.  Diagnostic views of the left and right coronary arteries were  taken with a 6-French JL-4 and JR-4 catheter.  Following selective  coronary angiography, an angled pigtail catheter was inserted into the  left ventricle, and pressures were recorded.  Left ventriculogram was  done.  Pullback across the aortic valve was performed.   At the conclusion of the diagnostic procedure, films were reviewed, and  there was an area of high-grade  stenosis in the mid to distal LAD.  This  was felt to be the most likely culprit for his exertional angina.  I  elected to proceed with ad hoc intervention.  The patient had been  preloaded with clopidogrel yesterday after his office visit.  Angiomax  was used for anticoagulation.  A 6-French XB LAD 3.5-mm guiding catheter  was inserted.  After therapeutic ACT was achieved, a short BMW coronary  guidewire was inserted into the distal LAD beyond the lesion.  A 2.0 x  12-mm maverick balloon was inserted and inflated up to 8 atmospheres.   Following balloon dilatation, vessel was felt to be approximately 2 mm  in diameter and a 2.0 x 15-mm Mini Vision stent was inserted and  deployed at 14 atmospheres.  Following stenting, there was an excellent  angiographic result.  A 2.25 x 12-mm Quantum Maverick noncompliant  balloon was inserted and used to post dilate the stent up to 16  atmospheres.  Following post dilatation, there was TIMI 3 flow and good  stent expansion, and the procedure was completed.  At the conclusion of  the procedure, a 6-French Angio-Seal  was used to seal the right femoral  arteriotomy.   FINDINGS:  Aortic pressure 142/78 with a mean of 105, left ventricular  pressure 142/7 with an end-diastolic pressure of 13.   Left mainstem is angiographically normal.  It trifurcates into the LAD,  intermediate branch and left circumflex.  The LAD is a large-caliber  vessel that courses down to the left ventricular apex.  Proximally,  there is 30% disease from the ostium of the LAD throughout the proximal  portion.  The LAD is angiographically normal around the area of the  first and second diagonals as well as the first septal perforator.  In  the mid-LAD, the vessel becomes a smaller vessel, and there is a high-  grade stenosis of approximately 80% at the area of the third diagonal  branch.  The distal LAD is small diameter and free of any significant  angiographic disease.  The  lesion in the mid LAD is somewhat phasic, but  there is clearly high-grade stenosis throughout the cardiac cycle.   Ramus intermedius is small caliber and angiographically normal.   Left circumflex is a large diameter vessel that gives off a small first  obtuse marginal branch and a large left posterolateral branch.  The true  circumflex is small diameter beyond the posterolateral branch.  The left  circumflex system has no significant angiographic disease.   Right coronary artery is angiographically normal.  Distally, it  terminates in a PDA and a posterior AV segment and then gives off two PL  branches.  There is a RV marginal branch within the mid portion of the  vessel.  There is no significant angiographic disease throughout the  right coronary artery.   Left ventriculography performed in the 30-degree right anterior oblique  projection shows normal left ventricular function with an estimated left  ventricular ejection fraction of 65%.  There is no mitral regurgitation.   ASSESSMENT:  1. Single-vessel coronary artery disease.  2. Normal left ventricular function.   DISCUSSION:  As above, I elected to treat the patient's mid LAD with  PTCA and bare-metal stenting.  A bare-metal stent was used because of  the small vessel size.  The patient had an excellent angiographic  result.  His symptoms were not reproduced with occlusion of the vessel  during stent and balloon expansion.  However, hopefully he will gain  symptomatic relief from treatment of this lesion.  Recommend the patient  continue on lifelong aspirin.  He should be on clopidogrel 75 mg for a  minimum of 1 month.  He should continue statin therapy as well as the  remainder of his medical therapy.   FINAL CONCLUSIONS:  Successful percutaneous transluminal coronary  angioplasty and stenting of the mid left anterior descending with a bare-  metal stent.      Veverly Fells. Excell Seltzer, MD Electronically Signed      MDC/MEDQ  D:  09/12/2006  T:  09/13/2006  Job:  [id number]   cc:   Quita Skye. Artis Flock, M.D.

## 2011-02-22 NOTE — Discharge Summary (Signed)
NAME:  [name] Marks, [name] Marks NO.:  [id number]   MEDICAL RECORD NO.:  [id number]          PATIENT TYPE:  OIB   LOCATION:  6531                         FACILITY:  [facility]   PHYSICIAN:  Pricilla Riffle, MD, FACCDATE OF BIRTH:  09-09-1935   DATE OF ADMISSION:  09/12/2006  DATE OF DISCHARGE:  09/13/2006                               DISCHARGE SUMMARY   PRIMARY CARDIOLOGIST:  Dr. Tonny Bollman   PRINCIPAL DIAGNOSIS:  Single-vessel coronary artery disease.  1. Status post bare metal stenting left anterior descending artery      this admission.  2. Normal left ventricular function.   SECONDARY DIAGNOSES:  1. Hyperlipidemia.  2. Remote tobacco   REASON FOR ADMISSION:  [name] is a 76 year old male, with no prior  cardiac history, recently referred to Dr. Excell Seltzer in the clinic for  evaluation of exertional dyspnea.  His symptoms were felt to be  worrisome for anginal equivalent, and he was thus referred for an  elective cardiac catheterization in the lab.   HOSPITAL COURSE:  The patient presented for elective coronary  angiography, performed by Dr. Tonny Bollman (see report for full  details), revealing significant single-vessel coronary artery disease,  successfully treated with placement of a bare metal stent with no noted  complications.  Left ventricular function was normal.   The patient was kept for overnight observation and cleared for discharge  the following morning in hemodynamically stable condition.  There were  no noted complications of the right groin incision site.   The patient will be treated with Plavix for 1 month.  He was on Lipitor  prior to admission, which had just been recently added by Dr. Excell Seltzer.   DISCHARGE LABORATORY DATA:  Normal CBC.  Sodium 137, potassium 4.0, BUN  13, creatinine 0.9.   DISCHARGE MEDICATIONS:  1. Plavix 75 mg daily (x1 month).  2. Coated aspirin 325 mg daily.  3. Lipitor 40 mg daily.  4. Nitrostat 0.4 mg as  directed.   DISCHARGE MEDICATIONS:  The patient is referred to cardiac  catheterization discharge sheet for full instructions.   FOLLOWUP:  Arrangements will be made for the patient to follow up with  Dr. Tonny Bollman in 2 weeks.   DISPOSITION:  Stable.      Gene Serpe, PA-C      Pricilla Riffle, MD, [facility]  Electronically Signed    GS/MEDQ  D:  09/13/2006  T:  09/13/2006  Job:  [id number]   cc:   Quita Skye. Artis Flock, M.D.

## 2011-02-22 NOTE — Op Note (Signed)
NAME:  [name] Marks, [name] Marks                        ACCOUNT NO.:  [id number]   MEDICAL RECORD NO.:  [id number]                   PATIENT TYPE:  INP   LOCATION:  0355                                 FACILITY:  [facility]   PHYSICIAN:  Susanne Borders, MD                     DATE OF BIRTH:  [birth year]   DATE OF PROCEDURE:  05/13/2003  DATE OF DISCHARGE:                                 OPERATIVE REPORT   PREOPERATIVE DIAGNOSIS:  Adenocarcinoma of the prostate.   POSTOPERATIVE DIAGNOSIS:  Adenocarcinoma of the prostate.   PROCEDURE:  Radical retropubic prostatectomy with bilateral lymph node  dissection.   SURGEON:  Mark C. Vernie Ammons, M.D.   ASSISTANT:  Susanne Borders, MD   ANESTHESIA:  General endotracheal.   PATHOLOGY:  1. Bilateral pelvic lymph nodes to pathology.  2. Prostate to pathology.   DRAINS:  1. Jackson-Pratt drain to bulb suction.  2. 18 French Foley catheter to straight drain.   INDICATIONS FOR PROCEDURE:  [name] Marks is a 76 year old who was being  followed by Dr. Vernie Ammons for elevated PSA. The patient subsequently underwent  transrectal ultrasound guided prostate biopsy which demonstrated Gleason's  3+3=6 adenocarcinoma of the prostate in both lobes of his prostate gland.  After careful counseling, the patient elected to undergo radical retropubic  prostatectomy with attempts for nerve sparing.   DESCRIPTION OF PROCEDURE:  The patient was brought to the operating room and  correctly identified with his identification bracelet. He was given  preoperative antibiotics and general anesthesia. He was shaved, prepped and  draped in the typical sterile fashion. A bump was placed under the patient's  pelvis and the table was flexed slightly. A infraumbilical midline incision  was made with the knife. The incision was carried down to the level of the  rectus sheath. The rectus sheath was then incised with the Bovie  electrocautery. The bellies of the rectus abdominis were separated  medially  after incision of the transversalis fascia between them. This revealed the  retropubic space of Retzius. This space was defined more clearly with blunt  dissection using surgeon's hand to sweep the fat away from the pelvic  sidewalls bilaterally. This revealed the iliac vessels bilaterally. The  Bookwalter retractor was then used to hold the bladder medially. The lymph  nodes overlying the iliac vein as well as the obturator nodes were removed  from the iliac vein bilaterally. This was done with Metzenbaum scissors with  great care taken to avoid injury to the obturator nerve. Smaller bleeding  vessels were clipped and cut. Prior to removal of the lymph node packet,  clips were used to avoid lymphocele formation. After lymph node dissection  was complete, the bladder was retracted cephalad to expose the prostate. A  Kidner was used to bluntly remove the fatty tissue overlying the endopelvic  fascia in the anterior prostate. The superficial branch of the dorsal venous  complex was incised with the Bovie electrocautery. A small window was made  in the endopelvic fascia bilaterally and the Metzenbaum scissors were used  to extend the incision in the endopelvic cephalad. Care was taken to avoid  cutting too deeply to injure the neurovascular bundle. Blunt dissection was  used to develop the groove between the dorsal venous complex and the  anterior surface of the urethra. The prostate was deep within the pelvis and  access to this space was very difficult. The Hohenfellner right angle was  passed between the dorsal venous complex and the urethra and a #1 Vicryl was  passed underneath the dorsal venous complex. The Vicryl was tied and the  Hohenfellner right angle was reinserted and the dorsal venous complex was  incised with electrocautery. The bleeding that was observed from the dorsal  venous complex was over sewed with another #1 Vicryl in a figure-of-eight  fashion. This slowed  the bleeding down somewhat but it continued to be an  ooze of bleeding. Attention was then turned to passing a right angle  underneath the urethra between the urethra and the fascia at [place]. A  piece of umbilical tape was passed beneath the urethra and the anterior  urethra was cut on top of the catheter. The catheter was delivered out into  the urethra and then the posterior urethra was cut with a long handle knife.  The plane between the prostate and the rectum was then developed posteriorly  with surgeon's finger. Attention was then turned to the lateral pedicles.  The lateral pedicles were taken down with right angled dissection and using  clips and  Metzenbaum scissors in the location of the neurovascular bundle  to avoid damage to the nerves. More laterally, the pedicles were taken down  with electrocautery but clips were applied to prevent excessive bleeding.  The dorsal venous complex continued to ooze and another 2-0 chromic suture  was used to stop this bleeding more. Having taken down lateral pedicles to  the level of the seminal vesicles, attention was turned anteriorly. The  plane between the prostate and the bladder neck was developed with sharp  dissection. The tissue overlying the bladder neck was removed and then the  Metzenbaum scissors were used to divide the base of the prostate from the  bladder. After this was done the only remaining attachments of the prostate  were the seminal vesicles and the anterior lobe of the vas. These were  clipped and cut with Metzenbaum scissors bilaterally and the prostate was  removed and tacked. There remained a slightly enlarged bladder opening. The  bladder mucosa was everted with 4-0 chromic and the bladder neck was  reconstructed in a tennis racquet fashion at 6 o'clock. The Greenwald  urethral sound was then passed into the urethra and the anastomotic stitches  were placed at 5 o'clock and 7 o'clock. The [place] was removed  and a Foley catheter was placed in the urethra. The anastomotic sutures were then  placed at 2 o'clock as well as 10 o'clock and also at 12 o'clock. The  sutures were then placed through the bladder mucosa at the bladder neck and  the bladder was released from traction. The five anastomotic sutures were  then tied down. The bladder was irrigated and there was no apparent leak at  the anastomosis. A Jackson-Pratt drain was placed to the left of the  incision by making a small skin incision with the knife and passing a tonsil  through the abdominal  wall. The fascia was then closed with a running #1  PDS. Please note that the anastomotic sutures were 2-0 PDS. The wound was  copiously irrigated and the skin was closed with a stapling device. Please  note that Dr. Vernie Ammons was the primary surgeon and participated in all  aspects of this case. The patient was taken to the post anesthesia care unit  in stable condition.                                               Susanne Borders, MD    DR/MEDQ  D:  05/13/2003  T:  05/14/2003  Job:  [id number]

## 2011-02-22 NOTE — Op Note (Signed)
[facility]  Patient:    [name] Marks, [name] Marks                     MRN: [id number] Proc. Date: 08/20/00 Adm. Date:  [id number] Attending:  Charlton Haws CC:         Dr. Mamie Nick   Operative Report  CCS [phone].  PREOPERATIVE DIAGNOSIS:  Right inguinal hernia.  POSTOPERATIVE DIAGNOSIS:  Right inguinal hernia, indirect.  PROCEDURE:  Repair right inguinal hernia with mesh.  SURGEON:  Currie Paris, M.D.  ANESTHESIA:  General (LMA).  CLINICAL HISTORY:  This patient is a 76 year old who has a right inguinal hernia that has been giving a little bit more discomfort.  It was reducible on exam.  DESCRIPTION OF PROCEDURE:  The patient was brought to the operating room and given satisfactory general anesthesia with an LMA.  To help with postoperative analgesia, I mixed 1% Xylocaine with epinephrine and 0.5% Marcaine equally and used as a local to take over once the anesthetic was completed.  I infiltrated the skin line and then below the anterior superior iliac spine.  The incision was made and deepened to the external oblique aponeurosis with additional local and infiltrated as I went through layers.  The aponeurosis was opened in the line of its fibers.  The ilioinguinal nerve was identified and was coming up onto the cord and as the cord was surrounded, the ilioinguinal nerve was kept with this.  The cord was freed up off of the floor, which appeared to be intact.  There was a fair amount of material protruding through the internal ring, and I opened the cord and reduced this, which appeared to be a large, probably 5 cm long, firm piece of preperitoneal fat.  Once this was completely reduced and the cord cleaned off so that I just had it deeper and well-identiifed, there was about a 2 cm opening through the deep ring.  I took the small mesh plug and placed it into the deep ring and secured it with some Prolenes, reapproximating the  deep ring as well.  I then took the mesh patch and laid it on the floor and it went around the cord, and I sutured it with a running 2-0 Prolene inferiorly and then tacked it medially to the internal oblique and let the tails cross and go well laterally.  The ilioinguinal nerve was kept coming through the opening and was not tethered at all.  This lay flat with no tension, and I thought would give a nice reinforcement to the floor.  The wound was checked for hemostasis, and everything appeared to be dry.  I closed the external oblique with 3-0 Vicryl, Scarpas with 3-0 Vicryl, and skin with 4-0 Monocryl subcuticular plus Steri-Strips.  The patient tolerated the procedure well.  There were no operative complications.  All counts were correct. DD:  08/20/00 TD:  08/20/00 Job: [id number]

## 2011-02-22 NOTE — Letter (Signed)
September 11, 2006    Quita Skye. Artis Flock, M.D.  [address], [address]  [place],  [place] [address]   RE:  [name], Marks  MRN:  [id number]  /  DOB:  23-Nov-1934   Dear Rosanne Ashing:   It was my pleasure to see [name] Marks this afternoon at the [facility].  As you know, he is a very pleasant 76 year old  gentleman with limited past medical problems who has a recent history of  exertional dyspnea.  He reports a 83-month history of dyspnea on exertion  which is much worse over the past two to three weeks.  He describes  symptoms of shortness of breath as well as a dull ache in his mid chest  area when he does any type of exertion.  He is able to walk on flat  ground but with an incline, he develops symptoms.  When he hurries or  does anything more than walking at a slow pace he develops these  symptoms.  He also has associated diaphoresis and mild nausea when these  symptoms arise.  He reports that when he was raking his leaves last week  he had to stop several times for the ache and the shortness of breath to  go away.  His symptoms typically resolve after four to five minutes and  then he is able to resume his previous activity.  He denies orthopnea,  PND, palpitations, or syncope.  He experiences lightheadedness when he  first rises up from the sitting or supine position.  He has no other  complaints at this time.   PAST MEDICAL HISTORY:  1. Total prostatectomy for prostatectomy in 2004.  He has had no      residual problems by his report.  2. He also has a history of right hernia surgery in 2002.  3. He has had no other hospitalizations or chronic medical problems      per his report.   SOCIAL HISTORY:  The patient is married.  He has three children.  He is  retired from [employer].  He still works on a part-time basis for  another company.   He has a remote smoking history but quit 35 years ago.  He does not  drink alcohol.  He walks regularly for exercise  but has been limited  recently by his chest pain and shortness of breath.   FAMILY HISTORY:  Pertinent for coronary artery disease.  His father died  of a myocardial infarction at age 56.  He has a brother who had stents  put in at age 54 and a sister who had a stent put in in her 82s.  Two  other sisters have not had problems with coronary artery disease.   REVIEW OF SYSTEMS:  A complete 12-point review of systems was performed.  Pertinent positives included seasonal allergies and urinary frequency  with nocturia.  There are no other symptoms to report.   Electrocardiogram, which was performed earlier today, demonstrated  normal sinus rhythm with a heart rate of 77.  There are no ST segment  changes or T-wave abnormalities.   Lab work from 2006 was pertinent for total cholesterol of 216 with an  HDL of 61 and an LDL cholesterol of 142, triglycerides were 65.  PSA was  less than 0.01.   ASSESSMENT:  [name] Marks is a 76 year old male with a significant  cardiovascular risk factor of a very strong family history of coronary  artery disease.  He  does not have other traditional risk factors with  the exception of hyperlipidemia.  He presents with classic symptoms of  stable angina.  While shortness of breath is his most prominent symptom,  I think the association with dull chest pain, nausea and diaphoresis are  classic for myocardial ischemia.  We discussed potential diagnostic  options which included noninvasive stress testing versus diagnostic  catheterization.  I favor performing a cardiac catheterization in the  setting of his highly suggestive symptoms and strong family history.  The patient is in agreement with this.  We have arranged for him to have  a cardiac catheterization tomorrow at [facility].  I gave him the option of either having the procedure performed in the [facility]  lab or in the inpatient cath lab.  He prefers the inpatient lab as he  would like to  have an ad hoc intervention done if necessary.  He was  given a prescription for clopidogrel.  He will take 300 mg today.  Depending on the results of  his catheterization, if he requires  intervention, he will be on long term clopidogrel.  He also was given a  prescription for Lipitor 40 mg daily.  I asked him to start this today  in case he requires a coronary intervention.  Statin therapy reduces the  risk of periprocedural myocardial infarction and I would like him to go  ahead and initiate this therapy tonight.   Rosanne Ashing, thanks again for the opportunity to evaluate [name] Marks.  I will  be in contact with you after the results of his catheterization  tomorrow.  Please feel free to contact me at any time with questions  regarding his care.    Sincerely,      Veverly Fells. Excell Seltzer, MD  Electronically Signed    MDC/MedQ  DD: 09/11/2006  DT: 09/11/2006  Job #: [id number]

## 2011-02-22 NOTE — H&P (Signed)
NAME:  [name] Marks, [name] Marks                        ACCOUNT NO.:  [id number]   MEDICAL RECORD NO.:  [id number]                   PATIENT TYPE:  INP   LOCATION:  0001                                 FACILITY:  [facility]   PHYSICIAN:  Mark C. Vernie Ammons, M.D.               DATE OF BIRTH:  03/16/1935   DATE OF ADMISSION:  05/13/2003  DATE OF DISCHARGE:                                HISTORY & PHYSICAL   HISTORY OF PRESENT ILLNESS:  The patient is a 76 year old white male who I  had seen in the past for kidney stones.  He was noted to have a PSA of 3.9  in May of 2002.  A year later it had increased to 4.72 and was repeated  three months later, slightly increased at 4.86.  He was started on Levaquin  due to the presence of dysuria, increased nocturia, as well as nitrite  positive urine in the hopes that this PSA elevation may have been due to a  subacute prostatitis and in fact the PSA did fall to 3.83.  However, it then  was found to have increased again in January back up to 5.30 with a slight  firmness noted on the examination of his prostate on the left hand side.  A  transrectal ultrasound revealed no specific lesions, a volume of 73.4 mL,  and what appeared to be an apparently intact capsule.  The biopsy proved  positive for adenocarcinoma Gleason 6 in approximately 50% of the specimen  obtained from the right hand side and the same Gleason score from the left  hand side in 5% of the biopsy.  He is admitted at this time electively for  radical retropubic prostatectomy.   PAST MEDICAL HISTORY:  Positive for kidney stones, otherwise negative.   PAST SURGICAL HISTORY:  He had knee surgery and past history of  herniorrhaphy.   MEDICATIONS:  Aspirin which he has been off now for three weeks.   ALLERGIES:  NKDA.   SOCIAL HISTORY:  He used to smoke for about 20 years, quit 30 years ago.  Denies alcohol use.   FAMILY HISTORY:  Father died of an MI at 89, mother of congestive heart  failure at  33.   REVIEW OF SYSTEMS:  Negative for flank pain or hematuria.  No unexplained  weight loss.  Essentially is negative as per health history and assessment  section I in the chart which was reviewed and signed.   PHYSICAL EXAMINATION:  GENERAL:  The patient is a well-developed, well-  nourished white male in no apparent distress.  VITAL SIGNS:  Temperature 97.0, pulse 64, respirations 18, blood pressure  132/70.  HEENT:  Atraumatic, normocephalic.  Oropharynx is clear.  NECK:  Supple without JVD or adenopathy.  CHEST:  Clear to auscultation.  CARDIOVASCULAR:  Regular rate and rhythm without murmur.  ABDOMEN:  Soft and nontender without mass or HSM.  He has right  inguinal  scar.  No inguinal hernias or adenopathy.  GENITOURINARY:  He has a normal circumcised phallus with normal glans  meatus, scrotum, testicles, and epididymis, as well as anus and perineum are  normal.  RECTAL:  His prostate is smooth, symmetric.  It is enlarged and slightly  firmer on the left than the right, but only subtly so.  There is no definite  nodularity nor fixation of the gland or extension of disease outside of the  prostate.  No seminal vesical abnormalities noted.  No rectal masses were  palpable.  EXTREMITIES:  Without clubbing, cyanosis, edema.  He has no deformity.  NEUROLOGIC:  He has no gross focal neurologic deficits.  He is alert and  oriented with appropriate mood and affect.   LABORATORIES:  PT 12.5, INR 0.9, PTT 30.  Electrolytes were normal with a  sodium of 141 and potassium 3.9, BUN 20, creatinine 1.1.  Liver function  tests are normal as well.  White count 6.8, H&H 14.9 and 44.1, platelets  256,000.   Chest x-ray revealed a possible 8 mm nodule in the left lower lung zone.  This was confirmed to be an overlying shadow of a fracture in the anterior  eighth rib seen by CT scan.  No lung nodules were identified.   EKG reveals normal sinus rhythm without ST or T-wave change.   IMPRESSION:   Clinical stage T1C adenocarcinoma of the prostate.  We  discussed all treatment options which have been outlined in my office notes  and been placed on the chart.  He has elected to proceed with radical  prostatectomy at this time.   PLAN:  1. Radical retropubic prostatectomy, bilateral pelvic lymph node dissection.  2. Routine DVT and PE prophylaxis.  3. Perioperative antibiotics.                                               Mark C. Vernie Ammons, M.D.    MCO/MEDQ  D:  05/13/2003  T:  05/13/2003  Job:  [id number]   cc:   Quita Skye. Artis Flock, M.D.  [address]  [place]  [place] [address]  Fax: [phone]

## 2011-02-22 NOTE — Discharge Summary (Signed)
NAME:  [name] Marks, [name] Marks                        ACCOUNT NO.:  [id number]   MEDICAL RECORD NO.:  [id number]                   PATIENT TYPE:  INP   LOCATION:  0355                                 FACILITY:  [facility]   PHYSICIAN:  Mark C. Vernie Ammons, M.D.               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  Gleason score 3+3=6.   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  Gleason score 3+3=6.   MAJOR OPERATION:  Radical retropubic prostatectomy and bilateral pelvic  lymph node dissection.   DISPOSITION:  The patient is discharged home in a stable, satisfactory, and  improved condition.  He currently has skin staples which will be removed at  his first office visit, a Foley catheter which will remain indwelling for  approximately two weeks, and his pelvic drain will remain present for  approximately one week or until output diminishes significantly.   ACTIVITY:  Activity will be limited to no heavy lifting, straining, vigorous  activities, driving, etc.   DISCHARGE MEDICATIONS:  1. Tylox one to two q.4h. p.r.n. #36.  2. Cipro XR 500 mg daily two weeks.  3. Colace 100 mg b.i.d. #40.  4. He will restart his aspirin as well.   FOLLOWUP:  Follow-up will be my office in one week for skin staple removal  and likely drain removal as well.   BRIEF HISTORY:  The patient is a 76 year old white male who has had kidney  stones in the past.  He was recently found to have an elevated PSA 4.72,  repeat confirmed elevation at 4.86 and due to symptoms of possible bladder  and/or prostate infection, patient was placed on antibiotics.  His PSA did  fall to 3.83, but then in January was up to 5.30 with firmness noted on  examination of the prostate on the right hand side.   A transrectal  ultrasound revealed what appeared to be an intact capsule with a 73 g  prostate.  The biopsy revealed Gleason 6 adenocarcinoma in approximately 50%  of the specimen from the right side and 5% on the left.  He was therefore  admitted for elective radical retropubic prostatectomy after having  discussed all treatment options.   PAST MEDICAL HISTORY:  Previously dictated and will not be repeated.  It is  noted in his hospital chart.   PAST SURGICAL HISTORY:  Previously dictated and will not be repeated.  It is  noted in his hospital chart.   PHYSICAL EXAMINATION:  Previously dictated and will not be repeated.  It is  noted in his hospital chart.   HOSPITAL COURSE:  On May 13, 2003 he was taken to the operating room where  he underwent  a radical retropubic prostatectomy, bilateral pelvic lymph node  dissection.  He tolerated the procedure well.  No complication.  Did receive  2 units of packed red cells intraoperatively.  The night of his surgery his  hemoglobin was 8.8, hematocrit 31%, white count 17.7.  His abdomen was flat  and soft with an intact dressing and he appeared to be doing well.  The  following day his vital signs remained stable and he was afebrile.  His  drain had put out approximately 90 mL per shift.  His abdomen remained soft.  The following day his H&H was 9.3 and 27.4 with a white count 11.2.  The  drain output continued elevated at 110 mL per shift.  Because of the  elevated drain output I checked a creatinine on the fluid and it returned  that of serum indicating this was likely lymph fluid as opposed to urine.  He continued to have good urine output with clear urine from his Foley  catheter.  His drain output increased to 600 mL over the next 24 hours and  therefore it was felt the drain should remain in.  He was ambulating.  He  was having flatus, but had not had a bowel movement on the morning of the  ninth and was given some laxatives.  He  eventually moved his bowels.  He was  tolerating a regular diet and his drain was removed from the suction and  placed on a sealed drainage bag.  He was therefore felt ready for discharge  with follow-up as noted above.                                               Mark C. Vernie Ammons, M.D.    MCO/MEDQ  D:  05/20/2003  T:  05/20/2003  Job:  [id number]

## 2011-03-24 ENCOUNTER — Other Ambulatory Visit: Payer: Self-pay | Admitting: Cardiovascular Disease

## 2011-03-31 ENCOUNTER — Other Ambulatory Visit: Payer: Self-pay | Admitting: Cardiovascular Disease

## 2011-05-07 ENCOUNTER — Encounter (INDEPENDENT_AMBULATORY_CARE_PROVIDER_SITE_OTHER): Payer: Self-pay | Admitting: *Deleted

## 2011-05-08 ENCOUNTER — Encounter: Payer: Self-pay | Admitting: Cardiology

## 2011-06-29 ENCOUNTER — Other Ambulatory Visit: Payer: Self-pay | Admitting: Cardiovascular Disease

## 2011-09-06 ENCOUNTER — Encounter: Payer: Self-pay | Admitting: Cardiovascular Disease

## 2011-09-09 ENCOUNTER — Encounter: Payer: Self-pay | Admitting: *Deleted

## 2011-09-09 ENCOUNTER — Ambulatory Visit (INDEPENDENT_AMBULATORY_CARE_PROVIDER_SITE_OTHER): Payer: Medicare Other | Admitting: Cardiovascular Disease

## 2011-09-09 DIAGNOSIS — I259 Chronic ischemic heart disease, unspecified: Secondary | ICD-10-CM

## 2011-09-09 DIAGNOSIS — I2510 Atherosclerotic heart disease of native coronary artery without angina pectoris: Secondary | ICD-10-CM

## 2011-09-09 DIAGNOSIS — I10 Essential (primary) hypertension: Secondary | ICD-10-CM

## 2011-09-09 DIAGNOSIS — E785 Hyperlipidemia, unspecified: Secondary | ICD-10-CM

## 2011-09-09 MED ORDER — SIMVASTATIN 40 MG PO TABS: 40.0000 mg | ORAL_TABLET | Freq: Every day | ORAL | Status: DC

## 2011-09-09 MED ORDER — LOSARTAN POTASSIUM 50 MG PO TABS: 50.0000 mg | ORAL_TABLET | Freq: Every day | ORAL | Status: DC

## 2011-09-09 NOTE — Assessment & Plan Note (Signed)
BP running too high. Add losartan 50 mg daily. Continue metoprolol.

## 2011-09-09 NOTE — Progress Notes (Signed)
HPI:  [name] Marks is a 76 year old gentleman returning for followup evaluation. The patient has coronary artery disease and has undergone prior stenting of the mid LAD with a bare-metal stent platform in 2007 and subsequent treatment of another stenosis in the LAD with a drug-eluting stent platform in 2010. He has moderately tight stenosis in the distal right coronary artery it has been managed medically.  The patient feels very well. He denies chest pain, dyspnea, edema, palpitations, or lightheadedness. He has been recording home BP's and his systolic BP has been ranging greater than 140 mm hg about 50% of the time.  Outpatient Encounter Prescriptions as of 09/09/2011  Medication Sig Dispense Refill  . aspirin 81 MG tablet Take 81 mg by mouth daily.        . cholecalciferol (VITAMIN D) 400 UNITS TABS Take 1,000 Units by mouth daily.        . Fenoprofen Calcium 400 MG CAPS Take 1 capsule by mouth 2 (two) times daily.        . metoprolol tartrate (LOPRESSOR) 25 MG tablet TAKE ONE (1) TABLET(S) TWICE DAILY  60 tablet  0  . Multiple Minerals-Vitamins (CITRACAL PLUS PO) Take 1 tablet by mouth 2 (two) times daily.        . simvastatin (ZOCOR) 40 MG tablet TAKE ONE (1) TABLET(S) BY MOUTH EVERY NIGHT AT BEDTIME  30 tablet  6    No Known Allergies  Past Medical History  Diagnosis Date  . Hypertension   . Peripheral vascular disease   . COPD (chronic obstructive pulmonary disease)   . Cerebrovascular disease   . Hyperlipidemia   . Polyposis of colon   . DJD (degenerative joint disease)   . Subdural hematoma     history of     ROS: Negative except as per HPI  There were no vitals taken for this visit.  PHYSICAL EXAM: Pt is alert and oriented, NAD HEENT: normal Neck: JVP - normal, carotids 2+= without bruits Lungs: CTA bilaterally CV: RRR without murmur or gallop Abd: soft, NT, Positive BS, no hepatomegaly Ext: no C/C/E, distal pulses intact and equal Skin: warm/dry no rash  EKG:   Reviewed from July 2012 and was within normal limits.  ASSESSMENT AND PLAN:

## 2011-09-09 NOTE — Patient Instructions (Signed)
Your physician wants you to follow-up in: 1 YEAR. You will receive a reminder letter in the mail two months in advance. If you don't receive a letter, please call our office to schedule the follow-up appointment.  Your physician has recommended you make the following change in your medication: START Losartan 50mg  take one by mouth daily

## 2011-09-09 NOTE — Assessment & Plan Note (Signed)
The patient is stable without angina. His anginal equivalent in the past has been exertional dyspnea. Continue same medical regimen. Follow-up 12 months.

## 2011-09-09 NOTE — Assessment & Plan Note (Signed)
Lipids are excellent with LDL less than 70 mg/dL. Continue same Rx.

## 2011-09-28 ENCOUNTER — Other Ambulatory Visit: Payer: Self-pay | Admitting: Cardiovascular Disease

## 2011-10-28 ENCOUNTER — Telehealth: Payer: Self-pay | Admitting: Cardiovascular Disease

## 2011-10-28 NOTE — Telephone Encounter (Signed)
New Problem:    Patient is calling because his pharmacists advised him to stop taking his aspirin 81 MG tablet. Please call back. Patient was informed that he would not be receiving a call back today.

## 2011-10-29 NOTE — Telephone Encounter (Signed)
I spoke with the pt and the pt's pharmacist said that the insurance did an evaluation on the pt's medications and felt the pt could stop ASA.  I advised the pt that he should not stop ASA and should remain on 81mg  daily. Pt agreed with plan.

## 2011-12-02 ENCOUNTER — Encounter: Payer: Self-pay | Admitting: Cardiovascular Disease

## 2011-12-06 ENCOUNTER — Encounter: Payer: Self-pay | Admitting: Internal Medicine

## 2011-12-06 ENCOUNTER — Ambulatory Visit: Payer: Medicare Other | Admitting: Internal Medicine

## 2011-12-06 VITALS — BP 116/52 | HR 68 | Resp 18 | Ht 70.0 in | Wt 217.0 lb

## 2011-12-06 DIAGNOSIS — Z01818 Encounter for other preprocedural examination: Secondary | ICD-10-CM | POA: Insufficient documentation

## 2011-12-06 NOTE — Assessment & Plan Note (Signed)
Patient came in for preoperative EKG today I have reviewed this ekg which reveals sinus rhythm with LVH, no ischemic changes.

## 2011-12-06 NOTE — Progress Notes (Signed)
[facility] EKG.  NSR per Dr. Johney Frame.

## 2011-12-28 ENCOUNTER — Other Ambulatory Visit: Payer: Self-pay | Admitting: Cardiovascular Disease

## 2012-01-24 ENCOUNTER — Encounter: Payer: Self-pay | Admitting: Cardiovascular Disease

## 2012-03-25 ENCOUNTER — Other Ambulatory Visit: Payer: Self-pay | Admitting: Cardiovascular Disease

## 2012-04-23 ENCOUNTER — Other Ambulatory Visit: Payer: Self-pay | Admitting: *Deleted

## 2012-04-23 MED ORDER — METOPROLOL TARTRATE 25 MG PO TABS: 25.0000 mg | ORAL_TABLET | Freq: Two times a day (BID) | ORAL | Status: DC

## 2012-09-13 ENCOUNTER — Other Ambulatory Visit: Payer: Self-pay | Admitting: Cardiovascular Disease

## 2012-10-09 ENCOUNTER — Ambulatory Visit (INDEPENDENT_AMBULATORY_CARE_PROVIDER_SITE_OTHER): Payer: Medicare Other | Admitting: Cardiovascular Disease

## 2012-10-09 ENCOUNTER — Encounter: Payer: Self-pay | Admitting: Cardiovascular Disease

## 2012-10-09 VITALS — BP 142/70 | HR 53 | Ht 70.0 in | Wt 225.0 lb

## 2012-10-09 DIAGNOSIS — I2510 Atherosclerotic heart disease of native coronary artery without angina pectoris: Secondary | ICD-10-CM

## 2012-10-09 NOTE — Progress Notes (Signed)
   HPI:  78 year old gentleman presenting for followup evaluation. The patient has coronary artery disease with history of LAD stenting. His most recent PCI procedure was in 2010. He has had moderate RCA stenosis managed medically. There have been 2 separate stenting procedures in the LAD, first with a bare-metal stent a second procedure with a drug-eluting stent. Lipids collected recently showed cholesterol of 147, triglycerides 76, HDL 62, and LDL 70. Fasting glucose was 96 and serum creatinine was 1.06 mg/dL.  The patient feels well. He admits to shortness of breath when walking up a hill or up stadium stairs. Otherwise he has no symptoms with normal activities. He walks on the treadmill intermittently for exercise. He denies chest pain or tightness. He denies diaphoresis or edema. He's been compliant with his medications.  Outpatient Encounter Prescriptions as of 10/09/2012  Medication Sig Dispense Refill  . aspirin 81 MG tablet Take 81 mg by mouth daily.        . cholecalciferol (VITAMIN D) 400 UNITS TABS Take 1,000 Units by mouth daily.        Marland Kitchen losartan (COZAAR) 50 MG tablet Take 50 mg by mouth daily.      . metoprolol tartrate (LOPRESSOR) 25 MG tablet Take 1 tablet (25 mg total) by mouth 2 (two) times daily.  180 tablet  3  . simvastatin (ZOCOR) 40 MG tablet TAKE ONE TABLET BY MOUTH AT BEDTIME  60 tablet  0  . [DISCONTINUED] Fenoprofen Calcium 400 MG CAPS Take 1 capsule by mouth 2 (two) times daily.        . [DISCONTINUED] loratadine (CLARITIN) 10 MG tablet Take 10 mg by mouth daily as needed.        . [DISCONTINUED] losartan (COZAAR) 50 MG tablet Take 1 tablet (50 mg total) by mouth daily.  90 tablet  3  . [DISCONTINUED] Multiple Minerals-Vitamins (CITRACAL PLUS PO) Take 1 tablet by mouth 2 (two) times daily.          No Known Allergies  Past Medical History  Diagnosis Date  . Hypertension   . Peripheral vascular disease   . COPD (chronic obstructive pulmonary disease)   .  Cerebrovascular disease   . Hyperlipidemia   . Polyposis of colon   . DJD (degenerative joint disease)   . Subdural hematoma     history of     ROS: Negative except as per HPI  BP 142/70  Pulse 53  Ht 5\' 10"  (1.778 m)  Wt 102.059 kg (225 lb)  BMI 32.28 kg/m2  SpO2 99%  PHYSICAL EXAM: Pt is alert and oriented, NAD HEENT: normal Neck: JVP - normal, carotids 2+= without bruits Lungs: CTA bilaterally CV: RRR without murmur or gallop Abd: soft, NT, Positive BS, no hepatomegaly Ext: no C/C/E, distal pulses intact and equal Skin: warm/dry no rash  EKG:  Sinus bradycardia 53 beats per minute, otherwise within normal limits.  ASSESSMENT AND PLAN: 1. Coronary artery disease, native vessel. The patient remained stable without anginal symptoms. His risk factors are well controlled. He will continue on his current medical regimen without changes.  2. Essential hypertension. Blood pressure is controlled on a combination of losartan and metoprolol.  3. Hyperlipidemia. The patient is on simvastatin and his lipids are excellent. Outside lab work was reviewed as above.  For followup I will see him back in one year.  Tonny Bollman 10/09/2012 9:45 AM

## 2012-10-09 NOTE — Patient Instructions (Addendum)
Your physician wants you to follow-up in: 1 YEAR with Dr Cooper.  You will receive a reminder letter in the mail two months in advance. If you don't receive a letter, please call our office to schedule the follow-up appointment.  Your physician recommends that you continue on your current medications as directed. Please refer to the Current Medication list given to you today.  

## 2012-11-14 ENCOUNTER — Other Ambulatory Visit: Payer: Self-pay | Admitting: Cardiovascular Disease

## 2012-12-14 ENCOUNTER — Other Ambulatory Visit: Payer: Self-pay | Admitting: Cardiovascular Disease

## 2013-04-22 ENCOUNTER — Other Ambulatory Visit: Payer: Self-pay | Admitting: *Deleted

## 2013-04-22 ENCOUNTER — Other Ambulatory Visit: Payer: Self-pay

## 2013-04-22 MED ORDER — METOPROLOL TARTRATE 25 MG PO TABS: 25.0000 mg | ORAL_TABLET | Freq: Two times a day (BID) | ORAL | Status: DC

## 2013-04-22 NOTE — Telephone Encounter (Signed)
Called pt. To verify pharmacy.

## 2013-06-11 ENCOUNTER — Other Ambulatory Visit: Payer: Self-pay | Admitting: Cardiovascular Disease

## 2013-10-10 ENCOUNTER — Other Ambulatory Visit: Payer: Self-pay | Admitting: Cardiovascular Disease

## 2013-10-15 ENCOUNTER — Ambulatory Visit (INDEPENDENT_AMBULATORY_CARE_PROVIDER_SITE_OTHER): Payer: Medicare Other | Admitting: Cardiovascular Disease

## 2013-10-15 ENCOUNTER — Encounter: Payer: Self-pay | Admitting: Cardiovascular Disease

## 2013-10-15 ENCOUNTER — Telehealth: Payer: Self-pay | Admitting: *Deleted

## 2013-10-15 VITALS — BP 144/64 | HR 68 | Ht 70.0 in | Wt 226.2 lb

## 2013-10-15 DIAGNOSIS — I209 Angina pectoris, unspecified: Secondary | ICD-10-CM

## 2013-10-15 DIAGNOSIS — R079 Chest pain, unspecified: Secondary | ICD-10-CM

## 2013-10-15 DIAGNOSIS — I259 Chronic ischemic heart disease, unspecified: Secondary | ICD-10-CM

## 2013-10-15 MED ORDER — ISOSORBIDE MONONITRATE 15 MG HALF TABLET: 15.0000 mg | ORAL_TABLET | Freq: Every day | ORAL | Status: DC

## 2013-10-15 NOTE — Telephone Encounter (Signed)
Pharmacy called to clarify imdur that was sent in today. Can you please clarify how she needs to take this? Thanks, MI

## 2013-10-15 NOTE — Telephone Encounter (Signed)
I spoke with the pharmacy and changed Rx to Isosorbide MN 30mg  take one-half tablet by mouth daily.  Medication list will be updated.

## 2013-10-15 NOTE — Progress Notes (Signed)
    HPI:   79 year old gentleman presenting for followup evaluation. The patient has coronary artery disease with history of LAD stenting. His most recent PCI procedure was in 2010. He has had moderate RCA stenosis managed medically. There have been 2 separate stenting procedures in the LAD, first with a bare-metal stent and second procedure with a drug-eluting stent.  He has been doing well until 3 months ago when he has developed recurrence of exertional dyspnea with walking up an incline or with vigorous activities. He can walk slowly on level ground without symptoms. He's had no rest pain or dyspnea. He denies chest pain or pressure, but dyspnea has been his anginal equivalent in the past. He denies lightheadedness or near-syncope.    Outpatient Encounter Prescriptions as of 10/15/2013  Medication Sig  . aspirin 81 MG tablet Take 81 mg by mouth daily.    . cholecalciferol (VITAMIN D) 400 UNITS TABS Take 1,000 Units by mouth daily.    Marland Kitchen. losartan (COZAAR) 50 MG tablet Take 50 mg by mouth daily.  . metoprolol tartrate (LOPRESSOR) 25 MG tablet Take 1 tablet (25 mg total) by mouth 2 (two) times daily.  . Multiple Vitamin (ONE-A-DAY MENS PO) Take by mouth daily.  . simvastatin (ZOCOR) 40 MG tablet TAKE 1 TABLET BY MOUTH EVERY NIGHT AT BEDTIME  . [DISCONTINUED] isosorbide mononitrate (IMDUR) 15 mg TB24 24 hr tablet Take 0.5 tablets (15 mg total) by mouth daily.  . [DISCONTINUED] isosorbide mononitrate (IMDUR) 15 mg TB24 24 hr tablet Take 0.5 tablets (15 mg total) by mouth daily.  . [DISCONTINUED] isosorbide mononitrate (IMDUR) 15 mg TB24 24 hr tablet Take 0.5 tablets (15 mg total) by mouth daily.    No Known Allergies  Past Medical History  Diagnosis Date  . Hypertension   . Peripheral vascular disease   . COPD (chronic obstructive pulmonary disease)   . Cerebrovascular disease   . Hyperlipidemia   . Polyposis of colon   . DJD (degenerative joint disease)   . Subdural hematoma     history  of     ROS: Negative except as per HPI  BP 144/64  Pulse 68  Ht 5\' 10"  (1.778 m)  Wt 226 lb 3.2 oz (102.604 kg)  BMI 32.46 kg/m2  PHYSICAL EXAM: Pt is alert and oriented, NAD HEENT: normal Neck: JVP - normal, carotids 2+= without bruits Lungs: CTA bilaterally CV: RRR without murmur or gallop Abd: soft, NT, Positive BS, no hepatomegaly Ext: no C/C/E, distal pulses intact and equal Skin: warm/dry no rash  EKG:  NSR 68 bpm, borderline changes for LVH  ASSESSMENT AND PLAN: 1. CAD - native vessel, s/p PCI. He now has recurrent CCS Class 3 symptoms of dyspnea (his past anginal equivalent). I reviewed options with him today. I don't think stress testing will add much meaningful information here. He will start Imdur 15 mg daily and I'll see him back in 2-3 weeks. If symptoms persist, will proceed with cardiac cath and possible PCI. He understands to call 911 for resting symptoms and to contact our office immediately if progressive exertional symptoms.  2. HTN - BP controlled on current Rx. Imdur added to regimen for anti-anginal effect.   3. Hyperlipidemia - pt on simvastatin. Outside labs reviewed (scanned into medical record) and lipids are at goal.   Tonny BollmanMichael Aryana Wonnacott 10/15/2013 5:22 PM

## 2013-10-15 NOTE — Patient Instructions (Addendum)
Your physician has recommended you make the following change in your medication:  START Imdur 15 mg once per day Continue all other medications as directed  Your physician recommends that you come in for follow-up on Friday, January 30 at 2:00 pm

## 2013-10-18 ENCOUNTER — Other Ambulatory Visit: Payer: Self-pay | Admitting: Cardiovascular Disease

## 2013-11-05 ENCOUNTER — Encounter: Payer: Self-pay | Admitting: Nurse Practitioner

## 2013-11-05 ENCOUNTER — Ambulatory Visit (INDEPENDENT_AMBULATORY_CARE_PROVIDER_SITE_OTHER): Payer: Medicare Other | Admitting: Cardiovascular Disease

## 2013-11-05 ENCOUNTER — Encounter: Payer: Self-pay | Admitting: Cardiovascular Disease

## 2013-11-05 VITALS — BP 148/74 | HR 65 | Ht 70.0 in | Wt 226.8 lb

## 2013-11-05 DIAGNOSIS — R079 Chest pain, unspecified: Secondary | ICD-10-CM

## 2013-11-05 LAB — CBC WITH DIFFERENTIAL/PLATELET
Basophils Absolute: 0.1 10*3/uL (ref 0.0–0.1)
Basophils Relative: 0.9 % (ref 0.0–3.0)
EOS PCT: 4.1 % (ref 0.0–5.0)
Eosinophils Absolute: 0.3 10*3/uL (ref 0.0–0.7)
HEMATOCRIT: 40.3 % (ref 39.0–52.0)
HEMOGLOBIN: 13.2 g/dL (ref 13.0–17.0)
Lymphocytes Relative: 23.9 % (ref 12.0–46.0)
Lymphs Abs: 2 10*3/uL (ref 0.7–4.0)
MCHC: 32.8 g/dL (ref 30.0–36.0)
MCV: 97.8 fl (ref 78.0–100.0)
MONOS PCT: 10 % (ref 3.0–12.0)
Monocytes Absolute: 0.9 10*3/uL (ref 0.1–1.0)
NEUTROS ABS: 5.2 10*3/uL (ref 1.4–7.7)
Neutrophils Relative %: 61.1 % (ref 43.0–77.0)
PLATELETS: 244 10*3/uL (ref 150.0–400.0)
RBC: 4.12 Mil/uL — ABNORMAL LOW (ref 4.22–5.81)
RDW: 13.3 % (ref 11.5–14.6)
WBC: 8.5 10*3/uL (ref 4.5–10.5)

## 2013-11-05 LAB — PROTIME-INR
INR: 1 ratio (ref 0.8–1.0)
Prothrombin Time: 11 s (ref 10.2–12.4)

## 2013-11-05 LAB — BASIC METABOLIC PANEL
BUN: 24 mg/dL — ABNORMAL HIGH (ref 6–23)
CO2: 27 mEq/L (ref 19–32)
Calcium: 9.7 mg/dL (ref 8.4–10.5)
Chloride: 106 mEq/L (ref 96–112)
Creatinine, Ser: 1.1 mg/dL (ref 0.4–1.5)
GFR: 71.26 mL/min (ref >60.00)
GLUCOSE: 83 mg/dL (ref 70–99)
POTASSIUM: 4.1 meq/L (ref 3.5–5.1)
SODIUM: 138 meq/L (ref 135–145)

## 2013-11-05 MED ORDER — CLOPIDOGREL BISULFATE 75 MG PO TABS: ORAL_TABLET | ORAL | Status: DC

## 2013-11-05 NOTE — Progress Notes (Signed)
    HPI:  79 year old gentleman presenting for followup evaluation. The patient has coronary artery disease with history of LAD stenting on 2 separate occasions. His most recent PCI procedure was in 2010. He's also been noted to have moderate RCA stenosis with medical therapy recommended.  The patient was just seen January 9. At that time he described worsening anginal symptoms with low to moderate level activities. He also had associated dyspnea. He had no resting symptoms. Isosorbide was added to his medical regimen he comes back in today for followup.  His symptoms continue to worsen. He's had chest pain with lower levels of walking. His symptoms resolve with rest. He still has not had any resting angina. Dyspnea is unchanged. There has been no orthopnea, PND, or leg swelling. He's had no lightheadedness or presyncope.  Outpatient Encounter Prescriptions as of 11/05/2013  Medication Sig  . aspirin 81 MG tablet Take 81 mg by mouth daily.    . cholecalciferol (VITAMIN D) 1000 UNITS tablet Take 1,000 Units by mouth daily.  . isosorbide mononitrate (IMDUR) 30 MG 24 hr tablet Take 0.5 tablets (15 mg total) by mouth daily.  Marland Kitchen. losartan (COZAAR) 50 MG tablet Take 50 mg by mouth daily.  . metoprolol tartrate (LOPRESSOR) 25 MG tablet TAKE 1 TABLET BY MOUTH TWICE DAILY  . Multiple Vitamin (ONE-A-DAY MENS PO) Take by mouth daily.  . simvastatin (ZOCOR) 40 MG tablet TAKE 1 TABLET BY MOUTH EVERY NIGHT AT BEDTIME  . [DISCONTINUED] cholecalciferol (VITAMIN D) 400 UNITS TABS Take 1,000 Units by mouth daily.      No Known Allergies  Past Medical History  Diagnosis Date  . Hypertension   . Peripheral vascular disease   . COPD (chronic obstructive pulmonary disease)   . Cerebrovascular disease   . Hyperlipidemia   . Polyposis of colon   . DJD (degenerative joint disease)   . Subdural hematoma     history of     ROS: Negative except as per HPI  BP 148/74  Pulse 65  Ht 5\' 10"  (1.778 m)  Wt 226 lb  12.8 oz (102.876 kg)  BMI 32.54 kg/m2  PHYSICAL EXAM: Pt is alert and oriented, NAD No change from recent exam a few weeks ago  ASSESSMENT AND PLAN: Coronary artery disease, native vessel. Patient with progressive symptoms, CCS class III. This is despite medical therapy with 2 antianginal drugs (metoprolol isosorbide). With progressive symptoms and known CAD, cardiac catheterization and possible PCI are clearly indicated. I have reviewed the risks, indications, and alternatives to cardiac catheterization and possible PCI. The patient understands and agrees to proceed. Will schedule for the next available date. Recommend that he start Plavix 150 mg daily x2 days, then 75 mg daily thereafter. He understands to call 911 if resting symptoms occur.  Tonny BollmanMichael Joseth Weigel 11/05/2013 2:42 PM

## 2013-11-05 NOTE — Patient Instructions (Signed)
Your physician has recommended you make the following change in your medication:  START Plavix 75 mg - take 2 pills (150 mg)  today, tomorrow and Sunday then take 1 pill (75 mg) daily   Your physician has requested that you have a cardiac catheterization. Cardiac catheterization is used to diagnose and/or treat various heart conditions. Doctors may recommend this procedure for a number of different reasons. The most common reason is to evaluate chest pain. Chest pain can be a symptom of coronary artery disease (CAD), and cardiac catheterization can show whether plaque is narrowing or blocking your heart's arteries. This procedure is also used to evaluate the valves, as well as measure the blood flow and oxygen levels in different parts of your heart. For further information please visit [email or web address]. Please follow instruction sheet, as given.  Your physician recommends that you have lab work today in preparation for cardiac cath on Monday

## 2013-11-07 ENCOUNTER — Other Ambulatory Visit: Payer: Self-pay | Admitting: Cardiovascular Disease

## 2013-11-07 DIAGNOSIS — I208 Other forms of angina pectoris: Secondary | ICD-10-CM

## 2013-11-08 ENCOUNTER — Ambulatory Visit (HOSPITAL_COMMUNITY)
Admission: RE | Admit: 2013-11-08 | Discharge: 2013-11-08 | Disposition: A | Discharge status: Home / Self Care | Payer: Medicare Other | Source: Ambulatory Visit | Attending: Cardiovascular Disease | Admitting: Cardiovascular Disease

## 2013-11-08 ENCOUNTER — Encounter (HOSPITAL_COMMUNITY)
Admission: RE | Disposition: A | Discharge status: Home / Self Care | Payer: Self-pay | Source: Ambulatory Visit | Attending: Cardiovascular Disease

## 2013-11-08 DIAGNOSIS — I2510 Atherosclerotic heart disease of native coronary artery without angina pectoris: Secondary | ICD-10-CM | POA: Insufficient documentation

## 2013-11-08 DIAGNOSIS — I679 Cerebrovascular disease, unspecified: Secondary | ICD-10-CM | POA: Insufficient documentation

## 2013-11-08 DIAGNOSIS — I209 Angina pectoris, unspecified: Secondary | ICD-10-CM | POA: Insufficient documentation

## 2013-11-08 DIAGNOSIS — I739 Peripheral vascular disease, unspecified: Secondary | ICD-10-CM | POA: Insufficient documentation

## 2013-11-08 DIAGNOSIS — Z7982 Long term (current) use of aspirin: Secondary | ICD-10-CM | POA: Insufficient documentation

## 2013-11-08 DIAGNOSIS — J449 Chronic obstructive pulmonary disease, unspecified: Secondary | ICD-10-CM | POA: Insufficient documentation

## 2013-11-08 DIAGNOSIS — E785 Hyperlipidemia, unspecified: Secondary | ICD-10-CM | POA: Insufficient documentation

## 2013-11-08 DIAGNOSIS — J4489 Other specified chronic obstructive pulmonary disease: Secondary | ICD-10-CM | POA: Insufficient documentation

## 2013-11-08 DIAGNOSIS — I208 Other forms of angina pectoris: Secondary | ICD-10-CM

## 2013-11-08 DIAGNOSIS — I10 Essential (primary) hypertension: Secondary | ICD-10-CM | POA: Insufficient documentation

## 2013-11-08 HISTORY — PX: LEFT HEART CATHETERIZATION WITH CORONARY ANGIOGRAM: SHX5451

## 2013-11-08 LAB — POCT ACTIVATED CLOTTING TIME: Activated Clotting Time: 215 seconds

## 2013-11-08 SURGERY — LEFT HEART CATHETERIZATION WITH CORONARY ANGIOGRAM
Anesthesia: LOCAL

## 2013-11-08 MED ORDER — HEPARIN SODIUM (PORCINE) 1000 UNIT/ML IJ SOLN
INTRAMUSCULAR | Status: AC
  Filled 2013-11-08: qty 1

## 2013-11-08 MED ORDER — ADENOSINE 12 MG/4ML IV SOLN
16.0000 mL | Freq: Once | INTRAVENOUS | Status: DC
  Filled 2013-11-08: qty 16

## 2013-11-08 MED ORDER — MIDAZOLAM HCL 2 MG/2ML IJ SOLN
INTRAMUSCULAR | Status: AC
  Filled 2013-11-08: qty 2

## 2013-11-08 MED ORDER — VERAPAMIL HCL 2.5 MG/ML IV SOLN
INTRAVENOUS | Status: AC
  Filled 2013-11-08: qty 2

## 2013-11-08 MED ORDER — ISOSORBIDE MONONITRATE ER 30 MG PO TB24: 30.0000 mg | ORAL_TABLET | Freq: Every day | ORAL | Status: DC

## 2013-11-08 MED ORDER — SODIUM CHLORIDE 0.9 % IV SOLN: 250.0000 mL | INTRAVENOUS | Status: DC | PRN

## 2013-11-08 MED ORDER — ACETAMINOPHEN 325 MG PO TABS: 650.0000 mg | ORAL_TABLET | ORAL | Status: DC | PRN

## 2013-11-08 MED ORDER — FENTANYL CITRATE 0.05 MG/ML IJ SOLN
INTRAMUSCULAR | Status: AC
  Filled 2013-11-08: qty 2

## 2013-11-08 MED ORDER — SODIUM CHLORIDE 0.9 % IV SOLN
INTRAVENOUS | Status: DC
  Administered 2013-11-08: 10:00:00 via INTRAVENOUS

## 2013-11-08 MED ORDER — ASPIRIN 81 MG PO CHEW: 81.0000 mg | CHEWABLE_TABLET | ORAL | Status: DC

## 2013-11-08 MED ORDER — SODIUM CHLORIDE 0.9 % IJ SOLN: 3.0000 mL | INTRAMUSCULAR | Status: DC | PRN

## 2013-11-08 MED ORDER — SODIUM CHLORIDE 0.9 % IV SOLN: 1.0000 mL/kg/h | INTRAVENOUS | Status: DC

## 2013-11-08 MED ORDER — LIDOCAINE HCL (PF) 1 % IJ SOLN
INTRAMUSCULAR | Status: AC
  Filled 2013-11-08: qty 30

## 2013-11-08 MED ORDER — HEPARIN (PORCINE) IN NACL 2-0.9 UNIT/ML-% IJ SOLN
INTRAMUSCULAR | Status: AC
  Filled 2013-11-08: qty 1000

## 2013-11-08 MED ORDER — NITROGLYCERIN 0.2 MG/ML ON CALL CATH LAB
INTRAVENOUS | Status: AC
  Filled 2013-11-08: qty 1

## 2013-11-08 MED ORDER — ONDANSETRON HCL 4 MG/2ML IJ SOLN: 4.0000 mg | Freq: Four times a day (QID) | INTRAMUSCULAR | Status: DC | PRN

## 2013-11-08 MED ORDER — SODIUM CHLORIDE 0.9 % IJ SOLN
3.0000 mL | Freq: Two times a day (BID) | INTRAMUSCULAR | Status: DC
  Administered 2013-11-08: 3 mL via INTRAVENOUS

## 2013-11-08 MED ORDER — DIAZEPAM 5 MG PO TABS
5.0000 mg | ORAL_TABLET | ORAL | Status: AC
  Administered 2013-11-08: 5 mg via ORAL
  Filled 2013-11-08: qty 1

## 2013-11-08 NOTE — Discharge Instructions (Signed)
° ° ° ° ° °  Radial Site Care Refer to this sheet in the next few weeks. These instructions provide you with information on caring for yourself after your procedure. Your caregiver may also give you more specific instructions. Your treatment has been planned according to current medical practices, but problems sometimes occur. Call your caregiver if you have any problems or questions after your procedure. HOME CARE INSTRUCTIONS  You may shower the day after the procedure.Remove the bandage (dressing) and gently wash the site with plain soap and water.Gently pat the site dry.  Do not apply powder or lotion to the site.  Do not submerge the affected site in water for 3 to 5 days.  Inspect the site at least twice daily.  Do not flex or bend the affected arm for 24 hours.  No lifting over 5 pounds (2.3 kg) for 5 days after your procedure.  Do not drive home if you are discharged the same day of the procedure. Have someone else drive you.  You may drive 24 hours after the procedure unless otherwise instructed by your caregiver.  Do not operate machinery or power tools for 24 hours.  A responsible adult should be with you for the first 24 hours after you arrive home. What to expect:  Any bruising will usually fade within 1 to 2 weeks.  Blood that collects in the tissue (hematoma) may be painful to the touch. It should usually decrease in size and tenderness within 1 to 2 weeks. SEEK IMMEDIATE MEDICAL CARE IF:  You have unusual pain at the radial site.  You have redness, warmth, swelling, or pain at the radial site.  You have drainage (other than a small amount of blood on the dressing).  You have chills.  You have a fever or persistent symptoms for more than 72 hours.  You have a fever and your symptoms suddenly get worse.  Your arm becomes pale, cool, tingly, or numb.  You have heavy bleeding from the site. Hold pressure on the site. Document Released: 10/26/2010 Document  Revised: 12/16/2011 Document Reviewed: 10/26/2010 [facility] VirginiaExitCare Patient Information 2014 [place]ExitCare, [place]. INCREASE IMDUR TO 30 MG DAILY  WE WILL CALL YOU WITH A FOLLOW-UP APPT

## 2013-11-08 NOTE — H&P (View-Only) (Signed)
    HPI:  79-year-old gentleman presenting for followup evaluation. The patient has coronary artery disease with history of LAD stenting on 2 separate occasions. His most recent PCI procedure was in 2010. He's also been noted to have moderate RCA stenosis with medical therapy recommended.  The patient was just seen January 9. At that time he described worsening anginal symptoms with low to moderate level activities. He also had associated dyspnea. He had no resting symptoms. Isosorbide was added to his medical regimen he comes back in today for followup.  His symptoms continue to worsen. He's had chest pain with lower levels of walking. His symptoms resolve with rest. He still has not had any resting angina. Dyspnea is unchanged. There has been no orthopnea, PND, or leg swelling. He's had no lightheadedness or presyncope.  Outpatient Encounter Prescriptions as of 11/05/2013  Medication Sig  . aspirin 81 MG tablet Take 81 mg by mouth daily.    . cholecalciferol (VITAMIN D) 1000 UNITS tablet Take 1,000 Units by mouth daily.  . isosorbide mononitrate (IMDUR) 30 MG 24 hr tablet Take 0.5 tablets (15 mg total) by mouth daily.  . losartan (COZAAR) 50 MG tablet Take 50 mg by mouth daily.  . metoprolol tartrate (LOPRESSOR) 25 MG tablet TAKE 1 TABLET BY MOUTH TWICE DAILY  . Multiple Vitamin (ONE-A-DAY MENS PO) Take by mouth daily.  . simvastatin (ZOCOR) 40 MG tablet TAKE 1 TABLET BY MOUTH EVERY NIGHT AT BEDTIME  . [DISCONTINUED] cholecalciferol (VITAMIN D) 400 UNITS TABS Take 1,000 Units by mouth daily.      No Known Allergies  Past Medical History  Diagnosis Date  . Hypertension   . Peripheral vascular disease   . COPD (chronic obstructive pulmonary disease)   . Cerebrovascular disease   . Hyperlipidemia   . Polyposis of colon   . DJD (degenerative joint disease)   . Subdural hematoma     history of     ROS: Negative except as per HPI  BP 148/74  Pulse 65  Ht 5' 10" (1.778 m)  Wt 226 lb  12.8 oz (102.876 kg)  BMI 32.54 kg/m2  PHYSICAL EXAM: Pt is alert and oriented, NAD No change from recent exam a few weeks ago  ASSESSMENT AND PLAN: Coronary artery disease, native vessel. Patient with progressive symptoms, CCS class III. This is despite medical therapy with 2 antianginal drugs (metoprolol isosorbide). With progressive symptoms and known CAD, cardiac catheterization and possible PCI are clearly indicated. I have reviewed the risks, indications, and alternatives to cardiac catheterization and possible PCI. The patient understands and agrees to proceed. Will schedule for the next available date. Recommend that he start Plavix 150 mg daily x2 days, then 75 mg daily thereafter. He understands to call 911 if resting symptoms occur.  Tenea Sens 11/05/2013 2:42 PM      

## 2013-11-08 NOTE — CV Procedure (Signed)
    Cardiac Catheterization Procedure Note  Name: [name] BrownieDavid S Letterman MRN: [id number] DOB: 03/13/1935  Procedure: Left Heart Cath, Selective Coronary Angiography, LV angiography, pressure wire analysis of the LAD  Indication: CCS class III angina, known CAD.   Procedural Details: The right wrist was prepped, draped, and anesthetized with 1% lidocaine. Using the modified Seldinger technique, a 5 French sheath was introduced into the right radial artery. 3 mg of verapamil was administered through the sheath, weight-based unfractionated heparin was administered intravenously. Standard Judkins catheters were used for selective coronary angiography and left ventriculography. Catheter exchanges were performed over an exchange length guidewire.   Following the diagnostic procedure, I elected to perform pressure wire analysis of the LAD. There was mild to moderate stenosis in the proximal LAD and also diffuse mild to moderate stenosis in the mid LAD. Considering his significant symptoms of angina on good medical therapy, I thought it was important to define whether the LAD territory was ischemic. An XB LAD 3.5 cm guide catheter was utilized. Additional heparin was given until a therapeutic ACT was achieved. The pressure wire was normalized at the tip of the guide catheter using normal protocol. With intravenous adenosine, the FFR was 0.79 at peak hyperemia. However, there was a gradual pressure gradient involving the course of the entire mid LAD. I do not think percutaneous intervention was appropriate because of the diffuse nature of disease. Medical therapy will be continued. There were no immediate procedural complications. A TR band was used for radial hemostasis at the completion of the procedure.  The patient was transferred to the post catheterization recovery area for further monitoring.  Procedural Findings: Hemodynamics: AO 111/56 LV 115/17  Coronary angiography: Coronary dominance: right  Left  mainstem: The left main arises from the left coronary cusp. The vessel is patent with mild irregularity but no significant stenosis. It divides into the LAD and left circumflex.  Left anterior descending (LAD): The LAD is patent throughout. There is a long stented segment in the proximal vessel with 40-50% stenosis on the proximal edge of the stent. The diagonal arises from the stented segment is patent with mild ostial stenosis. The mid vessel is stented and it is of small caliber. The stent has diffuse 50% in-stent restenosis. There is no high-grade disease throughout the course of the LAD.  Left circumflex (LCx): The left circumflex is patent. There is no significant obstructive disease. There is a small intermediate branch. The obtuse marginal branches have no significant disease the  Right coronary artery (RCA): The vessel is patent throughout. The proximal, mid, and distal RCA had no significant disease. The PDA is widely patent. The posterior AV segment has 60-70% ostial stenosis unchanged from the previous studies. The PLA branch is widely patent   Left ventriculography: Left ventricular systolic function is normal, LVEF is estimated at 55-65%, there is no significant mitral regurgitation   Final Conclusions:   1. Mild to moderate diffuse LAD stenosis with borderline positive pressure wire analysis 2. Patency of the left circumflex and right coronary arteries was stable ostial stenosis of the right posterior AV segment 3. Normal LV function  Recommendations: Continued medical therapy. Will increase Imdur to 30 mg daily. Otherwise continue home medical regimen. Will stop Plavix as this was just started in anticipation of PCI which was not required today.  Tonny BollmanMichael Zeferino Mounts 11/08/2013, 12:16 PM

## 2013-11-08 NOTE — Interval H&P Note (Signed)
History and Physical Interval Note:  11/08/2013 11:15 AM  Druscilla Brownieavid S Hendry  has presented today for surgery, with the diagnosis of Angina  The various methods of treatment have been discussed with the patient and family. After consideration of risks, benefits and other options for treatment, the patient has consented to  Procedure(s): LEFT HEART CATHETERIZATION WITH CORONARY ANGIOGRAM (N/A) as a surgical intervention .  The patient's history has been reviewed, patient examined, no change in status, stable for surgery.  I have reviewed the patient's chart and labs.  Questions were answered to the patient's satisfaction.    Cath Lab Visit (complete for each Cath Lab visit)  Clinical Evaluation Leading to the Procedure:   ACS: no  Non-ACS:    Anginal Classification: CCS III  Anti-ischemic medical therapy: Maximal Therapy (2 or more classes of medications)  Non-Invasive Test Results: No non-invasive testing performed  Prior CABG: No previous CABG       Tonny BollmanMichael Keasia Dubose

## 2013-11-18 ENCOUNTER — Ambulatory Visit: Payer: Medicare Other | Admitting: Cardiovascular Disease

## 2013-12-10 ENCOUNTER — Encounter: Payer: Self-pay | Admitting: Cardiovascular Disease

## 2013-12-10 ENCOUNTER — Ambulatory Visit (INDEPENDENT_AMBULATORY_CARE_PROVIDER_SITE_OTHER): Payer: Medicare Other | Admitting: Cardiovascular Disease

## 2013-12-10 VITALS — BP 138/64 | HR 50 | Ht 70.0 in | Wt 226.8 lb

## 2013-12-10 DIAGNOSIS — I259 Chronic ischemic heart disease, unspecified: Secondary | ICD-10-CM

## 2013-12-10 DIAGNOSIS — I209 Angina pectoris, unspecified: Secondary | ICD-10-CM

## 2013-12-10 NOTE — Patient Instructions (Signed)
Your physician recommends that you continue on your current medications as directed. Please refer to the Current Medication list given to you today.  Your physician wants you to follow-up in: 6 months with Dr. Cooper.  You will receive a reminder letter in the mail two months in advance. If you don't receive a letter, please call our office to schedule the follow-up appointment.  

## 2013-12-10 NOTE — Progress Notes (Signed)
HPI:  [name] returns for cardiac followup. He 79 years old followed for CAD. He has a history of exertional angina and has undergone stenting of the LAD on 2 separate occasions. The patient has a drug-eluting stent in his proximal LAD and a small bare-metal stent in his mid/distal LAD. He was seen recently with accelerating anginal symptoms and underwent cardiac catheterization as outlined below. He was found to have moderate diffuse LAD stenosis. He had a borderline pressure wire evaluation of 0.79. Because of the diffuse nature of his disease, medical therapy was elected. He initially experienced headaches with isosorbide but these resolved after about one week. He is now tolerating it well. He is able to walk without symptoms of chest pain or pressure, or dyspnea with exertion. He admits to dyspnea with bending over to pick something up. He denies palpitations, lightheadedness, orthopnea, or PND.   Outpatient Encounter Prescriptions as of 12/10/2013  Medication Sig  . aspirin 81 MG tablet Take 81 mg by mouth daily.   . cholecalciferol (VITAMIN D) 1000 UNITS tablet Take 1,000 Units by mouth 2 (two) times daily.   Marland Kitchen. ibuprofen (ADVIL) 200 MG tablet Take 2,040 mg by mouth daily as needed (for headache).  . isosorbide mononitrate (IMDUR) 30 MG 24 hr tablet Take 1 tablet (30 mg total) by mouth daily.  Marland Kitchen. losartan (COZAAR) 50 MG tablet Take 50 mg by mouth daily.  . metoprolol tartrate (LOPRESSOR) 25 MG tablet Take 25 mg by mouth 2 (two) times daily.  . Multiple Vitamin (ONE-A-DAY MENS PO) Take 1 tablet by mouth daily.   . simvastatin (ZOCOR) 40 MG tablet Take 40 mg by mouth at bedtime.    No Known Allergies  Past Medical History  Diagnosis Date  . Hypertension   . Peripheral vascular disease   . COPD (chronic obstructive pulmonary disease)   . Cerebrovascular disease   . Hyperlipidemia   . Polyposis of colon   . DJD (degenerative joint disease)   . Subdural hematoma     history of      ROS: Negative except as per HPI  BP 138/64  Pulse 50  Ht 5\' 10"  (1.778 m)  Wt 226 lb 12.8 oz (102.876 kg)  BMI 32.54 kg/m2  PHYSICAL EXAM: Pt is alert and oriented, NAD HEENT: normal Neck: JVP - normal, carotids 2+= without bruits Lungs: CTA bilaterally CV: RRR without murmur or gallop Abd: soft, NT, Positive BS, no hepatomegaly Ext: no C/C/E, distal pulses intact and equal Skin: warm/dry no rash  Cardiac Cath 11/08/2013: Procedural Findings:  Hemodynamics:  AO 111/56  LV 115/17  Coronary angiography:  Coronary dominance: right  Left mainstem: The left main arises from the left coronary cusp. The vessel is patent with mild irregularity but no significant stenosis. It divides into the LAD and left circumflex.  Left anterior descending (LAD): The LAD is patent throughout. There is a long stented segment in the proximal vessel with 40-50% stenosis on the proximal edge of the stent. The diagonal arises from the stented segment is patent with mild ostial stenosis. The mid vessel is stented and it is of small caliber. The stent has diffuse 50% in-stent restenosis. There is no high-grade disease throughout the course of the LAD.  Left circumflex (LCx): The left circumflex is patent. There is no significant obstructive disease. There is a small intermediate branch. The obtuse marginal branches have no significant disease the  Right coronary artery (RCA): The vessel is patent throughout. The proximal, mid,  and distal RCA had no significant disease. The PDA is widely patent. The posterior AV segment has 60-70% ostial stenosis unchanged from the previous studies. The PLA branch is widely patent  Left ventriculography: Left ventricular systolic function is normal, LVEF is estimated at 55-65%, there is no significant mitral regurgitation  Final Conclusions:  1. Mild to moderate diffuse LAD stenosis with borderline positive pressure wire analysis  2. Patency of the left circumflex and right  coronary arteries was stable ostial stenosis of the right posterior AV segment  3. Normal LV function  Recommendations: Continued medical therapy. Will increase Imdur to 30 mg daily. Otherwise continue home medical regimen. Will stop Plavix as this was just started in anticipation of PCI which was not required today.  [name] Marks  11/08/2013, 12:16 PM  EKG: Sinus bradycardia 54 beats per minute, borderline criteria for LVH, otherwise normal limits.  ASSESSMENT AND PLAN: 1. Coronary artery disease, native vessel. The patient is stable without symptoms of angina associated with walking or other low/moderate activities. He does admit to dyspnea with heavy lifting (CCS class II). He is tolerating isosorbide and will continue with medical management. If symptoms accelerate could consider PCI of the LAD down the road. However, I think medical therapy is best for now. He will remain on a combination of aspirin, isosorbide, and metoprolol. I will see him back in 6 months.  2. Hypertension. Blood pressure is controlled on his current medical program.  3. Hyperlipidemia. Patient takes simvastatin and he is followed by his primary care physician. We had extensive discussion about diet and weight loss strategies. He is motivated to lose weight. I think this will help significantly with his dyspnea.  [name] Marks 12/10/2013 11:58 AM

## 2014-01-16 ENCOUNTER — Other Ambulatory Visit: Payer: Self-pay | Admitting: Cardiovascular Disease

## 2014-04-17 ENCOUNTER — Other Ambulatory Visit: Payer: Self-pay | Admitting: Cardiovascular Disease

## 2014-05-01 ENCOUNTER — Other Ambulatory Visit: Payer: Self-pay | Admitting: Cardiovascular Disease

## 2014-06-21 ENCOUNTER — Encounter: Payer: Self-pay | Admitting: Cardiovascular Disease

## 2014-06-21 ENCOUNTER — Ambulatory Visit (INDEPENDENT_AMBULATORY_CARE_PROVIDER_SITE_OTHER): Payer: Medicare Other | Admitting: Cardiovascular Disease

## 2014-06-21 VITALS — BP 118/64 | HR 50 | Ht 70.0 in | Wt 220.0 lb

## 2014-06-21 DIAGNOSIS — E785 Hyperlipidemia, unspecified: Secondary | ICD-10-CM

## 2014-06-21 DIAGNOSIS — I10 Essential (primary) hypertension: Secondary | ICD-10-CM

## 2014-06-21 NOTE — Progress Notes (Signed)
    HPI:  79 year old gentleman presenting for followup evaluation. The patient has coronary artery disease and has undergone PCI of the LAD. Earlier this year he presented with exertional angina and underwent cardiac catheterization and pressure wire analysis of moderate diffuse stenosis in the LAD. His FFR was 0.79 and because of the diffuse nature of his CAD, medical therapy was recommended. He was started on Imdur. He returns today for followup evaluation.  He has been doing very well since I saw him last. His walking for exercise 4 days per week. He walks for about 30 minutes with no exertional symptoms. He denies chest pain, chest pressure, or shortness of breath. No leg swelling, palpitations, or lightheadedness. His medications are unchanged. He has no complaints today.  Outpatient Encounter Prescriptions as of 06/21/2014  Medication Sig  . aspirin 81 MG tablet Take 81 mg by mouth daily.   . cholecalciferol (VITAMIN D) 1000 UNITS tablet Take 1,000 Units by mouth 2 (two) times daily.   Marland Kitchen ibuprofen (ADVIL) 200 MG tablet Take 2,040 mg by mouth daily as needed (for headache).  . isosorbide mononitrate (IMDUR) 30 MG 24 hr tablet Take 1 tablet (30 mg total) by mouth daily.  Marland Kitchen losartan (COZAAR) 50 MG tablet Take 50 mg by mouth daily.  . metoprolol tartrate (LOPRESSOR) 25 MG tablet TAKE 1 TABLET BY MOUTH TWICE DAILY  . Multiple Vitamin (ONE-A-DAY MENS PO) Take 1 tablet by mouth daily.   . simvastatin (ZOCOR) 40 MG tablet TAKE 1 TABLET BY MOUTH EVERY NIGHT AT BEDTIME    No Known Allergies  Past Medical History  Diagnosis Date  . Hypertension   . Peripheral vascular disease   . COPD (chronic obstructive pulmonary disease)   . Cerebrovascular disease   . Hyperlipidemia   . Polyposis of colon   . DJD (degenerative joint disease)   . Subdural hematoma     history of     ROS: Negative except as per HPI  BP 118/64  Pulse 50  Ht  (1.778 m)  Wt 220 lb (99.791 kg)  BMI 31.57  kg/m2  PHYSICAL EXAM: Pt is alert and oriented, NAD HEENT: normal Neck: JVP - normal, carotids 2+= without bruits Lungs: CTA bilaterally CV: Bradycardic and regular with a soft systolic ejection murmur at the right upper sternal border Abd: soft, NT, Positive BS, no hepatomegaly Ext: no C/C/E, distal pulses intact and equal Skin: warm/dry no rash  EKG:  Sinus bradycardia 50 beats per minute, borderline criteria for LVH maybe normal variant. Otherwise within normal limits.  ASSESSMENT AND PLAN: 1. Coronary artery disease, native vessel. The patient is stable without symptoms of angina. He is doing well on his current medical program and I will make no changes today. I will see him back in one year for followup evaluation.  2. Hypertension. Blood pressure is controlled on his current medical program.   3. Hyperlipidemia. Tolerating simvastatin 40 mg and lipids are followed by his PCP. He has done a good job with exercise. He will send lab work from his yearly exam which is coming up in January.  Tonny Bollman MD 06/21/2014 12:04 PM

## 2014-06-21 NOTE — Patient Instructions (Signed)
Your physician wants you to follow-up in: 1 YEAR with Dr Cooper.  You will receive a reminder letter in the mail two months in advance. If you don't receive a letter, please call our office to schedule the follow-up appointment.  Your physician recommends that you continue on your current medications as directed. Please refer to the Current Medication list given to you today.  

## 2014-06-29 ENCOUNTER — Ambulatory Visit: Payer: Medicare Other | Admitting: Cardiovascular Disease

## 2014-07-17 ENCOUNTER — Other Ambulatory Visit: Payer: Self-pay | Admitting: Cardiovascular Disease

## 2014-09-15 ENCOUNTER — Encounter (HOSPITAL_COMMUNITY): Payer: Self-pay | Admitting: Cardiovascular Disease

## 2014-10-12 ENCOUNTER — Encounter: Payer: Self-pay | Admitting: Cardiovascular Disease

## 2014-10-24 ENCOUNTER — Other Ambulatory Visit: Payer: Self-pay | Admitting: Cardiovascular Disease

## 2014-12-01 ENCOUNTER — Other Ambulatory Visit: Payer: Self-pay | Admitting: Cardiovascular Disease

## 2015-04-09 ENCOUNTER — Other Ambulatory Visit: Payer: Self-pay | Admitting: Cardiovascular Disease

## 2015-06-26 ENCOUNTER — Telehealth: Payer: Self-pay | Admitting: Cardiovascular Disease

## 2015-06-26 DIAGNOSIS — I2510 Atherosclerotic heart disease of native coronary artery without angina pectoris: Secondary | ICD-10-CM

## 2015-06-26 NOTE — Telephone Encounter (Signed)
New Message   Pt needs stress test a  for his medical card that expires on October 712016  Please call Pt back

## 2015-06-26 NOTE — Telephone Encounter (Signed)
Please order an exercise echocardiogram. thx

## 2015-06-26 NOTE — Telephone Encounter (Signed)
I spoke with the pt and his Medical Card for [employer] expires 07/14/2015.  Per the pt's paperwork the pt needs a stress test performed due to his of cardiac stents.  The paperwork does not specify what time of stress testing is required.  I will forward this message to Dr Excell Seltzer for review so that he can give order for specific type of stress test.

## 2015-06-29 ENCOUNTER — Telehealth (HOSPITAL_COMMUNITY): Payer: Self-pay | Admitting: *Deleted

## 2015-07-10 NOTE — Telephone Encounter (Signed)
Upon review of appts stress echo has been scheduled for July 12, 2015

## 2015-07-11 ENCOUNTER — Encounter: Payer: Self-pay | Admitting: Cardiology

## 2015-07-12 ENCOUNTER — Other Ambulatory Visit (HOSPITAL_COMMUNITY): Payer: Self-pay

## 2015-07-12 ENCOUNTER — Ambulatory Visit (HOSPITAL_COMMUNITY): Payer: Medicare Other | Attending: Cardiology

## 2015-07-12 DIAGNOSIS — I2510 Atherosclerotic heart disease of native coronary artery without angina pectoris: Secondary | ICD-10-CM | POA: Diagnosis present

## 2015-07-14 ENCOUNTER — Telehealth: Payer: Self-pay | Admitting: Cardiovascular Disease

## 2015-07-14 NOTE — Telephone Encounter (Signed)
New Message  Pt calling about stress echo results/ Please call back and discuss.  

## 2015-07-14 NOTE — Telephone Encounter (Signed)
See result note. Negative study. thx

## 2015-07-14 NOTE — Telephone Encounter (Signed)
Notified of stress echo results.  He requested a copy so can send to [facility].

## 2015-07-14 NOTE — Telephone Encounter (Signed)
Calling wanting results of stress echo that was done yesterday 07/12/15.  Advised Dr. Excell Seltzer has not been able to read and will be called as soon as results been read.  He verbalizes understanding and will wait for results.

## 2015-07-24 ENCOUNTER — Other Ambulatory Visit: Payer: Self-pay | Admitting: Cardiovascular Disease

## 2015-07-25 ENCOUNTER — Encounter: Payer: Self-pay | Admitting: Cardiovascular Disease

## 2015-07-28 ENCOUNTER — Encounter: Payer: Self-pay | Admitting: Cardiovascular Disease

## 2015-07-28 ENCOUNTER — Ambulatory Visit (INDEPENDENT_AMBULATORY_CARE_PROVIDER_SITE_OTHER): Payer: Medicare Other | Admitting: Cardiovascular Disease

## 2015-07-28 VITALS — BP 140/70 | HR 58 | Ht 70.0 in | Wt 222.0 lb

## 2015-07-28 DIAGNOSIS — I10 Essential (primary) hypertension: Secondary | ICD-10-CM | POA: Diagnosis not present

## 2015-07-28 DIAGNOSIS — E785 Hyperlipidemia, unspecified: Secondary | ICD-10-CM

## 2015-07-28 NOTE — Patient Instructions (Signed)

## 2015-07-28 NOTE — Progress Notes (Signed)
Cardiology Office Note Date:  07/28/2015   ID:  [name] Marks, DOB [birth year], MRN [id number]  PCP:  Elizabeth Palau, FNP  Cardiologist:  Tonny Bollman, MD    Chief Complaint  Patient presents with  . Follow-up    no complaints    History of Present Illness: [name] Marks is a 80 y.o. male who presents for followup evaluation. The patient has coronary artery disease and has undergone PCI of the LAD. In 2015 he presented with exertional angina and underwent cardiac catheterization and pressure wire analysis of moderate diffuse stenosis in the LAD. His FFR was 0.79 and because of the diffuse nature of his CAD, medical therapy was recommended. He was started on Imdur. He returns today for followup evaluation.  The patient is doing very well. He stays physically active and has no symptoms with normal activities. He does admit to mild shortness of breath when walking up a hill or doing hard work. This is long-standing and unchanged. He denies leg swelling, orthopnea, PND, or chest pain.   Past Medical History  Diagnosis Date  . Hypertension   . Peripheral vascular disease (HCC)   . COPD (chronic obstructive pulmonary disease) (HCC)   . Cerebrovascular disease   . Hyperlipidemia   . Polyposis of colon   . DJD (degenerative joint disease)   . Subdural hematoma (HCC)     history of     Past Surgical History  Procedure Laterality Date  . Cardiac catheterization  03/21/2010    Patent LAD (coronary artery) stents - No significant obstructive disease in the left circumflex -- Patent RCA with moderately severe ostial stenosis of the posterior descending (coronary) artery branch.  . Cardiac catheterization  08/01/2009   . Left heart catheterization with coronary angiogram N/A 11/08/2013    Procedure: LEFT HEART CATHETERIZATION WITH CORONARY ANGIOGRAM;  Surgeon: Micheline Chapman, MD;  Location: [facility] CATH LAB;  Service: Cardiovascular;  Laterality: N/A;    Current Outpatient  Prescriptions  Medication Sig Dispense Refill  . aspirin 81 MG tablet Take 81 mg by mouth daily.     . cholecalciferol (VITAMIN D) 1000 UNITS tablet Take 1,000 Units by mouth 2 (two) times daily.     . isosorbide mononitrate (IMDUR) 30 MG 24 hr tablet TAKE 1 TABLET BY MOUTH EVERY DAY 90 tablet 0  . losartan (COZAAR) 100 MG tablet Take 100 mg by mouth daily.    . metoprolol tartrate (LOPRESSOR) 25 MG tablet TAKE 1 TABLET BY MOUTH TWICE DAILY 180 tablet 0  . Multiple Vitamin (ONE-A-DAY MENS PO) Take 1 tablet by mouth daily.     . Multiple Vitamins-Minerals (PRESERVISION AREDS PO) Take 2 tablets by mouth daily.    . simvastatin (ZOCOR) 40 MG tablet TAKE 1 TABLET BY MOUTH EVERY NIGHT AT BEDTIME 30 tablet 9   No current facility-administered medications for this visit.    Allergies:   Review of patient's allergies indicates no known allergies.   Social History:  The patient  reports that he quit smoking about 43 years ago. He quit smokeless tobacco use about 43 years ago. His smokeless tobacco use included Chew.   Family History:  The patient's  family history includes Breast cancer in his sister and sister; Heart attack in his father and sister; Heart disease in his brother.    ROS:  Please see the history of present illness. All other systems are reviewed and negative.    PHYSICAL EXAM: VS:  BP 140/70 mmHg  Pulse 58  Ht 5\' 10"  (1.778 m)  Wt 222 lb (100.699 kg)  BMI 31.85 kg/m2 , BMI Body mass index is 31.85 kg/(m^2). GEN: Well nourished, well developed, in no acute distress HEENT: normal Neck: no JVD, no masses. No carotid bruits Cardiac: RRR without murmur or gallop                Respiratory:  clear to auscultation bilaterally, normal work of breathing GI: soft, nontender, nondistended, + BS MS: no deformity or atrophy Ext: no pretibial edema, pedal pulses 2+= bilaterally Skin: warm and dry, no rash Neuro:  Strength and sensation are intact Psych: euthymic mood, full  affect  EKG:  EKG is ordered today. The ekg ordered today shows sinus bradycardia 58 bpm, occasional PVC, otherwise within normal limits  Recent Labs: No results found for requested labs within last 365 days.   Lipid Panel     Component Value Date/Time   CHOL  08/01/2009 0635    119        ATP III CLASSIFICATION:  <200     mg/dL   Desirable  [id number]  mg/dL   Borderline High  >=621>=240    mg/dL   High          TRIG 48 08/01/2009 0635   HDL 53 08/01/2009 0635   CHOLHDL 2.2 08/01/2009 0635   VLDL 10 08/01/2009 0635   LDLCALC  08/01/2009 0635    56        Total Cholesterol/HDL:CHD Risk Coronary Heart Disease Risk Table                     Men   Women  1/2 Average Risk   3.4   3.3  Average Risk       5.0   4.4  2 X Average Risk   9.6   7.1  3 X Average Risk  23.4   11.0        Use the calculated Patient Ratio above and the CHD Risk Table to determine the patient's CHD Risk.        ATP III CLASSIFICATION (LDL):  <100     mg/dL   Optimal  [id number]  mg/dL   Near or Above                    Optimal  130-159  mg/dL   Borderline  [id number]  mg/dL   High  >952>190     mg/dL   Very High      Wt Readings from Last 3 Encounters:  07/28/15 222 lb (100.699 kg)  06/21/14 220 lb (99.791 kg)  12/10/13 226 lb 12.8 oz (102.876 kg)     Cardiac Studies Reviewed: Stress Echo 07/12/2015: Study Conclusions  - Baseline ECG: NSR with non-specific T wave changes. - Stress ECG conclusions: Sinus tachycardia with 1 mm horizontal ST depression in V3-v5. - Baseline: LVEF 55-60%, normal wall motion and thickening. - Peak stress: LVEF 80%, hyperdynamic ventricle with normal motion and thickening. - Recovery: LVEF 60% with normal wall motion and thickening. - Impressions: No exercise induced ischemic wall motion abnormalities detected. 1 mm horizontal ST depression noted in the lateral leads with exercise. Exercise tolerance was fair and there was no chest pain. Negative stress echo for  ischemia.  Impressions:  - No exercise induced ischemic wall motion abnormalities detected. 1 mm horizontal ST depression noted in the lateral leads with exercise. Exercise tolerance was fair and there was no chest pain. Negative stress echo  for ischemia.  ASSESSMENT AND PLAN: 1.  CAD, native vessel: No symptoms of angina. Recent stress test reviewed and negative for ischemia. Continue current medical management.  2. Essential HTN: Blood pressure controlled on current treatment. Labs from primary care reviewed with creatinine 1.1, sodium 142, and potassium 4.7.  3. Hyperlipidemia: Lipids at goal with LDL 70, HDL 61, triglycerides 78, total cholesterol 147 continue simvastatin  Current medicines are reviewed with the patient today.  The patient does not have concerns regarding medicines.  Labs/ tests ordered today include:   Orders Placed This Encounter  Procedures  . EKG 12-Lead    Disposition:   FU one year  Signed, Tonny Bollman, MD  07/28/2015 10:14 AM    [facility] [address] [place], [place], [place]  [address] Phone: [phone]; Fax: [phone]

## 2015-10-04 ENCOUNTER — Other Ambulatory Visit: Payer: Self-pay | Admitting: Cardiovascular Disease

## 2015-10-09 ENCOUNTER — Other Ambulatory Visit: Payer: Self-pay | Admitting: Cardiovascular Disease

## 2015-10-23 ENCOUNTER — Other Ambulatory Visit: Payer: Self-pay | Admitting: Cardiovascular Disease

## 2015-10-26 ENCOUNTER — Encounter: Payer: Self-pay | Admitting: Internal Medicine

## 2016-01-14 ENCOUNTER — Other Ambulatory Visit: Payer: Self-pay | Admitting: Cardiovascular Disease

## 2016-05-16 ENCOUNTER — Encounter: Payer: Self-pay | Admitting: Cardiovascular Disease

## 2016-07-08 ENCOUNTER — Telehealth: Payer: Self-pay | Admitting: Cardiovascular Disease

## 2016-07-09 ENCOUNTER — Other Ambulatory Visit: Payer: Self-pay

## 2016-07-09 MED ORDER — METOPROLOL TARTRATE 25 MG PO TABS: 25.0000 mg | ORAL_TABLET | Freq: Two times a day (BID) | ORAL | 0 refills | Status: DC

## 2016-07-11 NOTE — Telephone Encounter (Signed)
Pt states he would like appt w/ Dr Excell Seltzerooper this month. I advised him Dr Excell Seltzerooper does not have any availability until Jan 2018. I offered appt w/ APP on a day Dr Excell Seltzerooper was in office he refused, states he wants to see Dr Excell Seltzerooper.  I advised Jan sch not open yet and to call back in about 1 week to make Jan appt.  He voiced understanding, thanks, and agreed with plan.

## 2016-07-11 NOTE — Telephone Encounter (Signed)
New message   Pt verbalized that he is calling for ov with Dr.Cooper and he wants it with in his recall month

## 2016-07-21 ENCOUNTER — Other Ambulatory Visit: Payer: Self-pay | Admitting: Cardiovascular Disease

## 2016-08-12 ENCOUNTER — Encounter: Payer: Self-pay | Admitting: Cardiovascular Disease

## 2016-08-12 ENCOUNTER — Encounter (INDEPENDENT_AMBULATORY_CARE_PROVIDER_SITE_OTHER): Payer: Self-pay

## 2016-08-12 ENCOUNTER — Ambulatory Visit (INDEPENDENT_AMBULATORY_CARE_PROVIDER_SITE_OTHER): Payer: Medicare Other | Admitting: Cardiovascular Disease

## 2016-08-12 VITALS — BP 142/80 | HR 55 | Ht 70.0 in | Wt 214.2 lb

## 2016-08-12 DIAGNOSIS — I2510 Atherosclerotic heart disease of native coronary artery without angina pectoris: Secondary | ICD-10-CM | POA: Diagnosis not present

## 2016-08-12 DIAGNOSIS — I10 Essential (primary) hypertension: Secondary | ICD-10-CM

## 2016-08-12 DIAGNOSIS — E785 Hyperlipidemia, unspecified: Secondary | ICD-10-CM

## 2016-08-12 MED ORDER — METOPROLOL TARTRATE 25 MG PO TABS: 25.0000 mg | ORAL_TABLET | Freq: Two times a day (BID) | ORAL | 3 refills | Status: DC

## 2016-08-12 NOTE — Progress Notes (Signed)
Cardiology Office Note Date:  08/12/2016   ID:  TRELYN VANDERLINDE, DOB 1935-03-04, MRN [id number]  PCP:  Elizabeth Palau, FNP  Cardiologist:  Tonny Bollman, MD    Chief Complaint  Patient presents with  . Coronary Artery Disease   History of Present Illness: [name] Marks is a 81 y.o. male who presents for  followup evaluation. The patient has coronary artery disease and has undergone PCI of the LAD. In 2015 he presented with exertional angina and underwent cardiac catheterization and pressure wire analysis of moderate diffuse stenosis in the LAD. His FFR was 0.79 and because of the diffuse nature of his CAD, medical therapy was recommended.  The patient is doing well. He's lost about 12 pounds with diet and exercise. He plays golf every other day and is walking 4 days per week. He does a 2 mile walk. He has no exertional symptoms and less he walks up a steep hill and he admits to mild shortness of breath with that level of activity. He's had no chest pain or pressure, edema, or heart palpitations. He's compliant with his medications.   Past Medical History:  Diagnosis Date  . Cerebrovascular disease   . COPD (chronic obstructive pulmonary disease) (HCC)   . DJD (degenerative joint disease)   . Hyperlipidemia   . Hypertension   . Peripheral vascular disease (HCC)   . Polyposis of colon   . Subdural hematoma (HCC)    history of     Past Surgical History:  Procedure Laterality Date  . CARDIAC CATHETERIZATION  03/21/2010   Patent LAD (coronary artery) stents - No significant obstructive disease in the left circumflex -- Patent RCA with moderately severe ostial stenosis of the posterior descending (coronary) artery branch.  . CARDIAC CATHETERIZATION  08/01/2009   . LEFT HEART CATHETERIZATION WITH CORONARY ANGIOGRAM N/A 11/08/2013   Procedure: LEFT HEART CATHETERIZATION WITH CORONARY ANGIOGRAM;  Surgeon: Micheline Chapman, MD;  Location: [facility] CATH LAB;  Service: Cardiovascular;   Laterality: N/A;    Current Outpatient Prescriptions  Medication Sig Dispense Refill  . aspirin 81 MG tablet Take 81 mg by mouth daily.     . cholecalciferol (VITAMIN D) 1000 UNITS tablet Take 1,000 Units by mouth 2 (two) times daily.     . isosorbide mononitrate (IMDUR) 30 MG 24 hr tablet TAKE 1 TABLET BY MOUTH EVERY DAY 90 tablet 0  . losartan (COZAAR) 100 MG tablet Take 100 mg by mouth daily.    . metoprolol tartrate (LOPRESSOR) 25 MG tablet Take 1 tablet (25 mg total) by mouth 2 (two) times daily. 180 tablet 3  . Multiple Vitamins-Minerals (PRESERVISION AREDS PO) Take 2 tablets by mouth daily.    . simvastatin (ZOCOR) 40 MG tablet TAKE 1 TABLET BY MOUTH EVERY NIGHT AT BEDTIME 30 tablet 10   No current facility-administered medications for this visit.     Allergies:   Patient has no known allergies.   Social History:  The patient  reports that he quit smoking about 44 years ago. He quit smokeless tobacco use about 44 years ago. His smokeless tobacco use included Chew.   Family History:  The patient's  family history includes Breast cancer in his sister and sister; Heart attack in his father and sister; Heart disease in his brother.   ROS:  Please see the history of present illness.   All other systems are reviewed and negative.   PHYSICAL EXAM: VS:  BP (!) 142/80 (BP Location: Left Arm, Patient Position: Sitting,  Cuff Size: Normal)   Pulse (!) 55   Ht 5\' 10"  (1.778 m)   Wt 97.2 kg (214 lb 4 oz)   BMI 30.74 kg/m  , BMI Body mass index is 30.74 kg/m. GEN: Well nourished, well developed, in no acute distress  HEENT: normal  Neck: no JVD, no masses. No carotid bruits Cardiac: RRR without murmur or gallop                Respiratory:  clear to auscultation bilaterally, normal work of breathing GI: soft, nontender, nondistended, + BS MS: no deformity or atrophy  Ext: no pretibial edema, pedal pulses 2+= bilaterally Skin: warm and dry, no rash Neuro:  Strength and sensation are  intact Psych: euthymic mood, full affect  EKG:  EKG is ordered today. The ekg ordered today shows Sinus bradycardia 55 bpm, otherwise within normal limits.  Recent Labs: No results found for requested labs within last 8760 hours.   Lipid Panel     Component Value Date/Time   CHOL  08/01/2009 0635    119        ATP III CLASSIFICATION:  <200     mg/dL   Desirable  [id number]  mg/dL   Borderline High  >=045>=240    mg/dL   High          TRIG 48 08/01/2009 0635   HDL 53 08/01/2009 0635   CHOLHDL 2.2 08/01/2009 0635   VLDL 10 08/01/2009 0635   LDLCALC  08/01/2009 0635    56        Total Cholesterol/HDL:CHD Risk Coronary Heart Disease Risk Table                     Men   Women  1/2 Average Risk   3.4   3.3  Average Risk       5.0   4.4  2 X Average Risk   9.6   7.1  3 X Average Risk  23.4   11.0        Use the calculated Patient Ratio above and the CHD Risk Table to determine the patient's CHD Risk.        ATP III CLASSIFICATION (LDL):  <100     mg/dL   Optimal  [id number]  mg/dL   Near or Above                    Optimal  130-159  mg/dL   Borderline  [id number]  mg/dL   High  >956>190     mg/dL   Very High      Wt Readings from Last 3 Encounters:  08/12/16 97.2 kg (214 lb 4 oz)  07/28/15 100.7 kg (222 lb)  06/21/14 99.8 kg (220 lb)     Cardiac Studies Reviewed: Stress Echo 07/12/2015: Study Conclusions  - Baseline ECG: NSR with non-specific T wave changes. - Stress ECG conclusions: Sinus tachycardia with 1 mm horizontal ST depression in V3-v5. - Baseline: LVEF 55-60%, normal wall motion and thickening. - Peak stress: LVEF 80%, hyperdynamic ventricle with normal motion and thickening. - Recovery: LVEF 60% with normal wall motion and thickening. - Impressions: No exercise induced ischemic wall motion abnormalities detected. 1 mm horizontal ST depression noted in the lateral leads with exercise. Exercise tolerance was fair and there was no chest pain. Negative stress  echo for ischemia.  Impressions:  - No exercise induced ischemic wall motion abnormalities detected. 1 mm horizontal ST depression noted in the lateral  leads with exercise. Exercise tolerance was fair and there was no chest pain. Negative stress echo for ischemia.  ASSESSMENT AND PLAN: 1.  CAD, native vessel, with angina: Controlled on his current medical program which includes isosorbide and metoprolol. He continues on aspirin and a statin drug.  2. Essential hypertension: Blood pressure controlled on current medications. He is doing a good job with lifestyle modification. His goal weight is 200 pounds.  3. Hyperlipidemia: Outside labs reviewed. Lab work from August 2017 shows a cholesterol of 148, triglycerides 76, HDL 59, LDL 72.  4. Hyperkalemia: Labs reviewed. Potassium was 5.9. He was to return 24-48 hours later for recheck and he states his repeat labs were okay. He continues on losartan and seems to be tolerating well at lower dose.   Current medicines are reviewed with the patient today.  The patient does not have concerns regarding medicines.  Labs/ tests ordered today include:   Orders Placed This Encounter  Procedures  . EKG 12-Lead   Disposition:   FU one year  Signed, Tonny Bollmanooper, Kawanna Christley, MD  08/12/2016 8:38 AM    [facility] [address], [place], [place]  [address] Phone: [phone]) [phone]; Fax: [phone]) [phone]

## 2016-08-12 NOTE — Patient Instructions (Signed)

## 2016-08-25 ENCOUNTER — Other Ambulatory Visit: Payer: Self-pay | Admitting: Cardiovascular Disease

## 2016-09-19 ENCOUNTER — Ambulatory Visit (INDEPENDENT_AMBULATORY_CARE_PROVIDER_SITE_OTHER): Payer: Medicare Other | Admitting: Orthopaedic Surgery

## 2016-09-19 ENCOUNTER — Ambulatory Visit (INDEPENDENT_AMBULATORY_CARE_PROVIDER_SITE_OTHER): Payer: Medicare Other

## 2016-09-19 ENCOUNTER — Encounter (INDEPENDENT_AMBULATORY_CARE_PROVIDER_SITE_OTHER): Payer: Self-pay | Admitting: Orthopaedic Surgery

## 2016-09-19 DIAGNOSIS — M25512 Pain in left shoulder: Secondary | ICD-10-CM | POA: Diagnosis not present

## 2016-09-19 MED ORDER — LIDOCAINE HCL 1 % IJ SOLN
3.0000 mL | INTRAMUSCULAR | Status: AC | PRN
  Administered 2016-09-19: 3 mL

## 2016-09-19 MED ORDER — METHYLPREDNISOLONE ACETATE 40 MG/ML IJ SUSP
40.0000 mg | INTRAMUSCULAR | Status: AC | PRN
  Administered 2016-09-19: 40 mg via INTRA_ARTICULAR

## 2016-09-19 MED ORDER — BUPIVACAINE HCL 0.5 % IJ SOLN
3.0000 mL | INTRAMUSCULAR | Status: AC | PRN
  Administered 2016-09-19: 3 mL via INTRA_ARTICULAR

## 2016-09-19 NOTE — Progress Notes (Addendum)
Office Visit Note   Patient: [name] Marks           Date of Birth: 11/14/1934           MRN: [id number] Visit Date: 09/19/2016              Requested by: Elizabeth Palaueresa Anderson, FNP [address] [place], [place] [address] PCP: Elizabeth PalauANDERSON,TERESA, FNP   Assessment & Plan: Visit Diagnoses:  1. Acute pain of left shoulder     Plan: Impression is rotator cuff strain. Subacromial injection was given. I will like to see him back in about 2-3 weeks to see how he is doing if not better we will consider MRI to rule out tear.  Follow-Up Instructions: Return in about 2 weeks (around 10/03/2016) for recheck left shoulder.   Orders:  Orders Placed This Encounter  Procedures  . XR Shoulder Left   No orders of the defined types were placed in this encounter.     Procedures: Large Joint Inj Date/Time: 09/19/2016 9:05 PM Performed by: Tarry KosXU, Lynsee Wands M Authorized by: Tarry KosXU, Corinthian Kemler M   Consent Given by:  Patient Timeout: prior to procedure the correct patient, procedure, and site was verified   Location:  Shoulder Site:  L subacromial bursa Prep: patient was prepped and draped in usual sterile fashion   Needle Size:  22 G Approach:  Posterior Ultrasound Guidance: No   Fluoroscopic Guidance: No   Arthrogram: No   Medications:  3 mL lidocaine 1 %; 3 mL bupivacaine 0.5 %; 40 mg methylPREDNISolone acetate 40 MG/ML     Clinical Data: No additional findings.   Subjective: Chief Complaint  Patient presents with  . Left Shoulder - Pain    Patient is a 81 year old gentleman with left shoulder pain since a fall directly onto the left shoulder on Tuesday, 09/17/2016. He was evaluated at the urgent care and x-rays were negative. He is 6 out of 10 pain that comes and goes and burns. Advil does help. The pain does not radiate to his difficulty sleeping because of the pain. He is right-handed.    Review of Systems  Constitutional: Negative.   HENT: Negative.   Eyes: Negative.     Respiratory: Negative.   Cardiovascular: Negative.   Gastrointestinal: Negative.   Endocrine: Negative.   Genitourinary: Negative.   Musculoskeletal: Negative.   Skin: Negative.   Allergic/Immunologic: Negative.   Neurological: Negative.   Hematological: Negative.   Psychiatric/Behavioral: Negative.      Objective: Vital Signs: There were no vitals taken for this visit.  Physical Exam  Constitutional: He is oriented to person, place, and time. He appears well-developed and well-nourished.  HENT:  Head: Normocephalic and atraumatic.  Eyes: EOM are normal.  Neck: Neck supple.  Cardiovascular: Intact distal pulses.   Pulmonary/Chest: Effort normal.  Abdominal: Soft.  Neurological: He is alert and oriented to person, place, and time.  Skin: Skin is warm.  Psychiatric: He has a normal mood and affect. His behavior is normal. Judgment and thought content normal.  Nursing note and vitals reviewed.   Ortho Exam Exam of the left shoulder shows very weak infraspinatus and a positive drop arm test is rotator cuff is intact and I do think that most of his weakness is from the pain itself. He does not have a pseudo-paralytic shoulder. Positive impingement signs Specialty Comments:  No specialty comments available.  Imaging: Xr Shoulder Left  Result Date: 09/19/2016 Acromioclavicular joint arthropathy no significant glenohumeral degenerative joint disease  PMFS History: Patient Active Problem List   Diagnosis Date Noted  . Acute pain of left shoulder 09/19/2016  . Preop testing 12/06/2011  . CHEST PAIN UNSPECIFIED 03/13/2010  . HEARTBURN 08/18/2009  . HYPERTENSION 04/19/2009  . ANGINA, CHRONIC 04/19/2009  . DYSLIPIDEMIA 11/19/2007  . CORONARY ARTERY DISEASE, S/P PTCA 11/19/2007  . NEOPLASM, MALIGNANT, PROSTATE, HX OF 11/19/2007  . NEPHROLITHIASIS, HX OF 11/19/2007   Past Medical History:  Diagnosis Date  . Cerebrovascular disease   . COPD (chronic obstructive  pulmonary disease) (HCC)   . DJD (degenerative joint disease)   . Hyperlipidemia   . Hypertension   . Peripheral vascular disease (HCC)   . Polyposis of colon   . Subdural hematoma (HCC)    history of     Family History  Problem Relation Age of Onset  . Heart attack Father   . Heart attack Sister   . Breast cancer Sister   . Breast cancer Sister   . Heart disease Brother     Past Surgical History:  Procedure Laterality Date  . CARDIAC CATHETERIZATION  03/21/2010   Patent LAD (coronary artery) stents - No significant obstructive disease in the left circumflex -- Patent RCA with moderately severe ostial stenosis of the posterior descending (coronary) artery branch.  . CARDIAC CATHETERIZATION  08/01/2009   . LEFT HEART CATHETERIZATION WITH CORONARY ANGIOGRAM N/A 11/08/2013   Procedure: LEFT HEART CATHETERIZATION WITH CORONARY ANGIOGRAM;  Surgeon: Micheline ChapmanMichael D Cooper, MD;  [facility];  Service: Cardiovascular;  Laterality: N/A;   Social History   Occupational History  . Not on file.   Social History Main Topics  . Smoking status: Former Smoker    Quit date: 09/09/1971  . Smokeless tobacco: Former NeurosurgeonUser    Types: Chew    Quit date: 09/09/1971  . Alcohol use Not on file  . Drug use: Unknown  . Sexual activity: Not on file

## 2016-10-03 ENCOUNTER — Ambulatory Visit (INDEPENDENT_AMBULATORY_CARE_PROVIDER_SITE_OTHER): Payer: Medicare Other | Admitting: Orthopaedic Surgery

## 2016-10-03 ENCOUNTER — Encounter (INDEPENDENT_AMBULATORY_CARE_PROVIDER_SITE_OTHER): Payer: Self-pay | Admitting: Orthopaedic Surgery

## 2016-10-03 DIAGNOSIS — M25512 Pain in left shoulder: Secondary | ICD-10-CM

## 2016-10-03 NOTE — Progress Notes (Signed)
   Office Visit Note   Patient: [name] BrownieDavid S Busch           Date of Birth: 11/06/1934           MRN: [id number] Visit Date: 10/03/2016              Requested by: Elizabeth Palaueresa Anderson, FNP [address] [place], [place] [address] PCP: Elizabeth PalauANDERSON,TERESA, FNP   Assessment & Plan: Visit Diagnoses: No diagnosis found.  Plan: impression is rotator cuff tear.  MRI to evaluate for RCT.  F/u 2 weeks.  Continue advil for now  Follow-Up Instructions: Return in about 2 weeks (around 10/17/2016) for review MRI.   Orders:  No orders of the defined types were placed in this encounter.  No orders of the defined types were placed in this encounter.     Procedures: No procedures performed   Clinical Data: No additional findings.   Subjective: Chief Complaint  Patient presents with  . Left Shoulder - Pain, Follow-up    F/u visit for left shoulder pain.  Injection helped 20% still has pain with reaching behind.  Pain radiates down the upper arm and into axilla.      Review of Systems   Objective: Vital Signs: There were no vitals taken for this visit.  Physical Exam  Ortho Exam +Hawkins, +empty can, +pain and weakness with IS testing  Specialty Comments:  No specialty comments available.  Imaging: No results found.   PMFS History: Patient Active Problem List   Diagnosis Date Noted  . Acute pain of left shoulder 09/19/2016  . Preop testing 12/06/2011  . CHEST PAIN UNSPECIFIED 03/13/2010  . HEARTBURN 08/18/2009  . HYPERTENSION 04/19/2009  . ANGINA, CHRONIC 04/19/2009  . DYSLIPIDEMIA 11/19/2007  . CORONARY ARTERY DISEASE, S/P PTCA 11/19/2007  . NEOPLASM, MALIGNANT, PROSTATE, HX OF 11/19/2007  . NEPHROLITHIASIS, HX OF 11/19/2007   Past Medical History:  Diagnosis Date  . Cerebrovascular disease   . COPD (chronic obstructive pulmonary disease) (HCC)   . DJD (degenerative joint disease)   . Hyperlipidemia   . Hypertension   . Peripheral vascular disease (HCC)    . Polyposis of colon   . Subdural hematoma (HCC)    history of     Family History  Problem Relation Age of Onset  . Heart attack Father   . Heart attack Sister   . Breast cancer Sister   . Breast cancer Sister   . Heart disease Brother     Past Surgical History:  Procedure Laterality Date  . CARDIAC CATHETERIZATION  03/21/2010   Patent LAD (coronary artery) stents - No significant obstructive disease in the left circumflex -- Patent RCA with moderately severe ostial stenosis of the posterior descending (coronary) artery branch.  . CARDIAC CATHETERIZATION  08/01/2009   . LEFT HEART CATHETERIZATION WITH CORONARY ANGIOGRAM N/A 11/08/2013   Procedure: LEFT HEART CATHETERIZATION WITH CORONARY ANGIOGRAM;  Surgeon: Micheline ChapmanMichael D Cooper, MD;  [facility];  Service: Cardiovascular;  Laterality: N/A;   Social History   Occupational History  . Not on file.   Social History Main Topics  . Smoking status: Former Smoker    Quit date: 09/09/1971  . Smokeless tobacco: Former NeurosurgeonUser    Types: Chew    Quit date: 09/09/1971  . Alcohol use Not on file  . Drug use: Unknown  . Sexual activity: Not on file

## 2016-10-03 NOTE — Addendum Note (Signed)
Addended by: Albertina ParrGARCIA, Kymia Simi on: 10/03/2016 10:39 AM   Modules accepted: Orders

## 2016-10-13 ENCOUNTER — Ambulatory Visit
Admission: RE | Admit: 2016-10-13 | Discharge: 2016-10-13 | Disposition: A | Discharge status: Home / Self Care | Payer: Medicare Other | Source: Ambulatory Visit | Attending: Orthopaedic Surgery | Admitting: Orthopaedic Surgery

## 2016-10-13 DIAGNOSIS — M25512 Pain in left shoulder: Secondary | ICD-10-CM

## 2016-10-14 ENCOUNTER — Other Ambulatory Visit: Payer: Self-pay | Admitting: Cardiovascular Disease

## 2016-10-17 ENCOUNTER — Encounter (INDEPENDENT_AMBULATORY_CARE_PROVIDER_SITE_OTHER): Payer: Self-pay | Admitting: Orthopaedic Surgery

## 2016-10-17 ENCOUNTER — Ambulatory Visit (INDEPENDENT_AMBULATORY_CARE_PROVIDER_SITE_OTHER): Payer: Medicare Other | Admitting: Orthopaedic Surgery

## 2016-10-17 DIAGNOSIS — M7542 Impingement syndrome of left shoulder: Secondary | ICD-10-CM

## 2016-10-17 DIAGNOSIS — M25561 Pain in right knee: Secondary | ICD-10-CM | POA: Diagnosis not present

## 2016-10-17 MED ORDER — DICLOFENAC SODIUM 75 MG PO TBEC: 75.0000 mg | DELAYED_RELEASE_TABLET | Freq: Two times a day (BID) | ORAL | 2 refills | Status: DC

## 2016-10-17 NOTE — Progress Notes (Signed)
Office Visit Note   Patient: [name] Marks           Date of Birth: 03/22/1935           MRN: [id number] Visit Date: 10/17/2016              Requested by: Elizabeth Palaueresa Anderson, FNP [address] [place], [place] [id number] PCP: Elizabeth PalauANDERSON,TERESA, FNP   Assessment & Plan: Visit Diagnoses:  1. Impingement syndrome of left shoulder     Plan: MRI shows a full tear with retraction of his infraspinatus tendon as well as severe acromioclavicular arthropathy with large osteophytes. Impression is left shoulder impingement syndrome with mild rotator cuff symptoms that well compensated. I recommend continuing some physical therapy and diclofenac as needed. I will like to see him back in 6 weeks to see how he is doing with these conservative measures. He did discuss the potential for arthroscopic debridement of his rotator cuff and treatment of his impingement syndrome.  Follow-Up Instructions: Return in about 6 weeks (around 11/28/2016).   Orders:  Orders Placed This Encounter  Procedures  . MR Knee Right w/o contrast   Meds ordered this encounter  Medications  . diclofenac (VOLTAREN) 75 MG EC tablet    Sig: Take 1 tablet (75 mg total) by mouth 2 (two) times daily.    Dispense:  30 tablet    Refill:  2      Procedures: No procedures performed   Clinical Data: No additional findings.   Subjective: Chief Complaint  Patient presents with  . Left Shoulder - Pain, Follow-up    Patient is following up for his MRI review. His shoulder feels about the same.    Review of Systems   Objective: Vital Signs: There were no vitals taken for this visit.  Physical Exam  Ortho Exam Exam left shoulder shows mainly impingement signs. Infraspinatus is weak but with mild pain. Overall well compensated shoulder. Positive cross adduction sign. Specialty Comments:  No specialty comments available.  Imaging: No results found.   PMFS History: Patient Active Problem List   Diagnosis Date Noted  . Impingement syndrome of left shoulder 10/17/2016  . Acute pain of left shoulder 09/19/2016  . Preop testing 12/06/2011  . CHEST PAIN UNSPECIFIED 03/13/2010  . HEARTBURN 08/18/2009  . HYPERTENSION 04/19/2009  . ANGINA, CHRONIC 04/19/2009  . DYSLIPIDEMIA 11/19/2007  . CORONARY ARTERY DISEASE, S/P PTCA 11/19/2007  . NEOPLASM, MALIGNANT, PROSTATE, HX OF 11/19/2007  . NEPHROLITHIASIS, HX OF 11/19/2007   Past Medical History:  Diagnosis Date  . Cerebrovascular disease   . COPD (chronic obstructive pulmonary disease) (HCC)   . DJD (degenerative joint disease)   . Hyperlipidemia   . Hypertension   . Peripheral vascular disease (HCC)   . Polyposis of colon   . Subdural hematoma (HCC)    history of     Family History  Problem Relation Age of Onset  . Heart attack Father   . Heart attack Sister   . Breast cancer Sister   . Breast cancer Sister   . Heart disease Brother     Past Surgical History:  Procedure Laterality Date  . CARDIAC CATHETERIZATION  03/21/2010   Patent LAD (coronary artery) stents - No significant obstructive disease in the left circumflex -- Patent RCA with moderately severe ostial stenosis of the posterior descending (coronary) artery branch.  . CARDIAC CATHETERIZATION  08/01/2009   . LEFT HEART CATHETERIZATION WITH CORONARY ANGIOGRAM N/A 11/08/2013   Procedure: LEFT HEART  CATHETERIZATION WITH CORONARY ANGIOGRAM;  Surgeon: Micheline Chapman, MD;  Location: [facility] C[facility] LAB;  Service: Cardiovascular;  Laterality: N/A;   Social History   Occupational History  . Not on file.   Social History Main Topics  . Smoking status: Former Smoker    Quit date: 09/09/1971  . Smokeless tobacco: Former Neurosurgeon    Types: Chew    Quit date: 09/09/1971  . Alcohol use Not on file  . Drug use: Unknown  . Sexual activity: Not on file

## 2016-11-25 ENCOUNTER — Ambulatory Visit (INDEPENDENT_AMBULATORY_CARE_PROVIDER_SITE_OTHER): Payer: Medicare Other | Admitting: Orthopaedic Surgery

## 2016-11-25 ENCOUNTER — Encounter (INDEPENDENT_AMBULATORY_CARE_PROVIDER_SITE_OTHER): Payer: Self-pay | Admitting: Orthopaedic Surgery

## 2016-11-25 DIAGNOSIS — M7542 Impingement syndrome of left shoulder: Secondary | ICD-10-CM | POA: Diagnosis not present

## 2016-11-25 NOTE — Progress Notes (Signed)
   Office Visit Note   Patient: [name] Marks           Date of Birth: 05/12/1935           MRN: [id number] Visit Date: 11/25/2016              Requested by: Elizabeth Palaueresa Anderson, FNP [address] [place], [place] [id number] PCP: Elizabeth PalauANDERSON,TERESA, FNP   Assessment & Plan: Visit Diagnoses:  1. Impingement syndrome of left shoulder     Plan: continue HEP, doing well.  F/u prn  Follow-Up Instructions: Return if symptoms worsen or fail to improve.   Orders:  No orders of the defined types were placed in this encounter.  No orders of the defined types were placed in this encounter.     Procedures: No procedures performed   Clinical Data: No additional findings.   Subjective: Chief Complaint  Patient presents with  . Left Shoulder - Follow-up    Patient f/u today for left shoulder pain.  [facility] and he's much better.  Occasionally radiates down across anterior shoulder.  Pain over bicipital groove.  Doing HEP.      Review of Systems   Objective: Vital Signs: There were no vitals taken for this visit.  Physical Exam  Ortho Exam Weak IS testing Overall well compensated shoulder function and rotator cuff function Specialty Comments:  No specialty comments available.  Imaging: No results found.   PMFS History: Patient Active Problem List   Diagnosis Date Noted  . Impingement syndrome of left shoulder 10/17/2016  . Acute pain of left shoulder 09/19/2016  . Preop testing 12/06/2011  . CHEST PAIN UNSPECIFIED 03/13/2010  . HEARTBURN 08/18/2009  . HYPERTENSION 04/19/2009  . ANGINA, CHRONIC 04/19/2009  . DYSLIPIDEMIA 11/19/2007  . CORONARY ARTERY DISEASE, S/P PTCA 11/19/2007  . NEOPLASM, MALIGNANT, PROSTATE, HX OF 11/19/2007  . NEPHROLITHIASIS, HX OF 11/19/2007   Past Medical History:  Diagnosis Date  . Cerebrovascular disease   . COPD (chronic obstructive pulmonary disease) (HCC)   . DJD (degenerative joint  disease)   . Hyperlipidemia   . Hypertension   . Peripheral vascular disease (HCC)   . Polyposis of colon   . Subdural hematoma (HCC)    history of     Family History  Problem Relation Age of Onset  . Heart attack Father   . Heart attack Sister   . Breast cancer Sister   . Breast cancer Sister   . Heart disease Brother     Past Surgical History:  Procedure Laterality Date  . CARDIAC CATHETERIZATION  03/21/2010   Patent LAD (coronary artery) stents - No significant obstructive disease in the left circumflex -- Patent RCA with moderately severe ostial stenosis of the posterior descending (coronary) artery branch.  . CARDIAC CATHETERIZATION  08/01/2009   . LEFT HEART CATHETERIZATION WITH CORONARY ANGIOGRAM N/A 11/08/2013   Procedure: LEFT HEART CATHETERIZATION WITH CORONARY ANGIOGRAM;  Surgeon: Micheline ChapmanMichael D Cooper, MD;  [facility];  Service: Cardiovascular;  Laterality: N/A;   Social History   Occupational History  . Not on file.   Social History Main Topics  . Smoking status: Former Smoker    Quit date: 09/09/1971  . Smokeless tobacco: Former NeurosurgeonUser    Types: Chew    Quit date: 09/09/1971  . Alcohol use Not on file  . Drug use: Unknown  . Sexual activity: Not on file

## 2017-04-15 ENCOUNTER — Other Ambulatory Visit: Payer: Self-pay | Admitting: Cardiovascular Disease

## 2017-06-17 IMAGING — MR MR SHOULDER*L* W/O CM
4 of 5 series · 23 of 40 positions shown · non-contrast
Comparison: None.

CLINICAL DATA: Left shoulder pain since falling on a patio 1 month
ago. Initial encounter.

EXAM:
MRI OF THE LEFT SHOULDER WITHOUT CONTRAST
TECHNIQUE: Multiplanar, multisequence MR imaging of the shoulder was performed.
No intravenous contrast was administered.

[Series 5: T2 fat-sat · axial · 4.0mm · 0.44mm/px · z∈[-39,+32]mm · 7 of 16 slices shown]
[im 1/16]
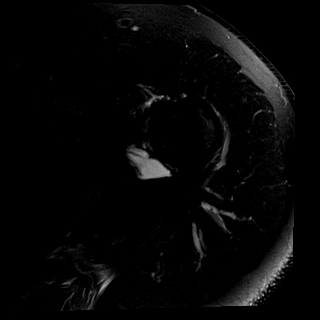
[im 3/16]
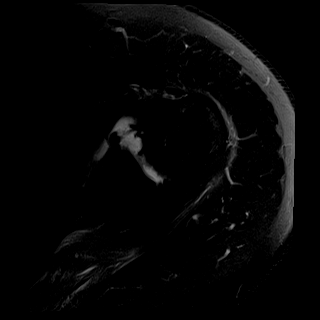
[im 6/16]
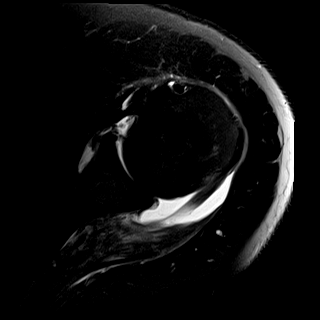
[im 8/16]
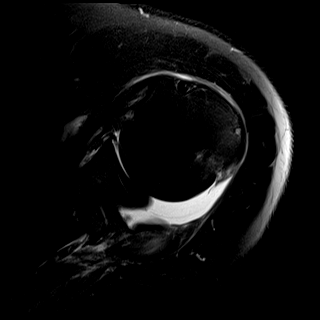
[im 11/16]
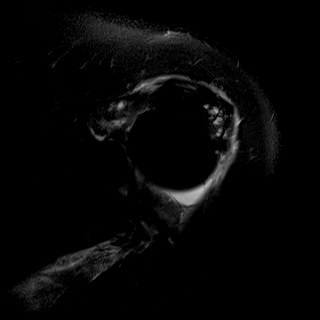
[im 13/16]
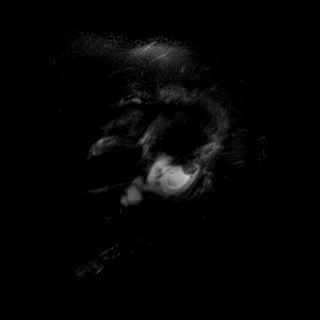
[im 16/16]
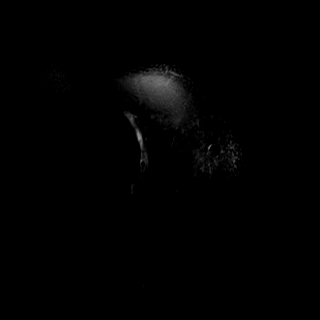

[Series 6: T2 · oblique · 4.0mm · 0.31mm/px · 3 of 16 slices shown]
[im 3/16]
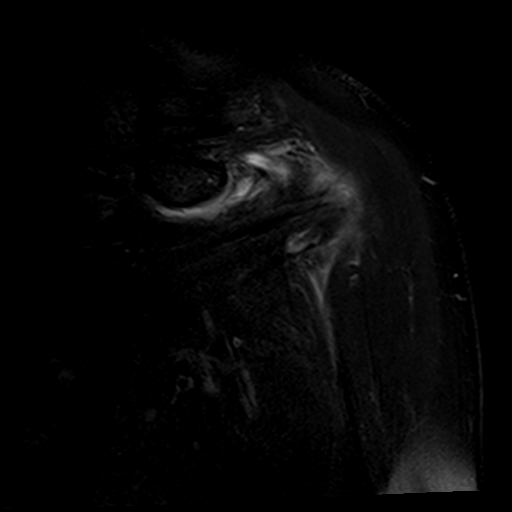
[im 8/16]
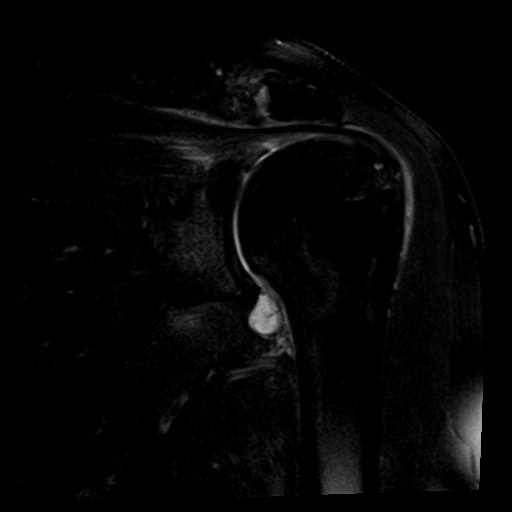
[im 13/16]
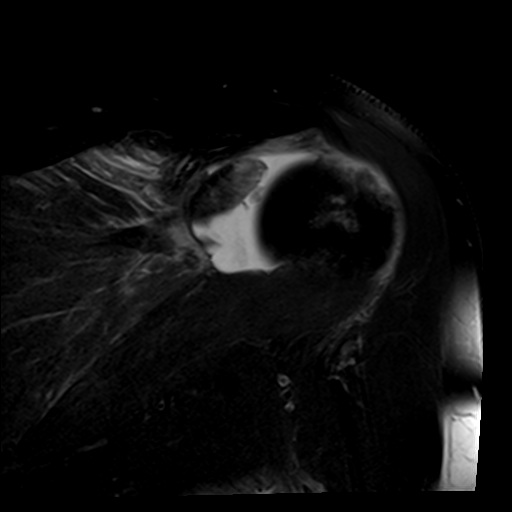

[Series 7: PD · oblique · 4.0mm · 0.31mm/px · 8 of 16 slices shown]
[im 1/16]
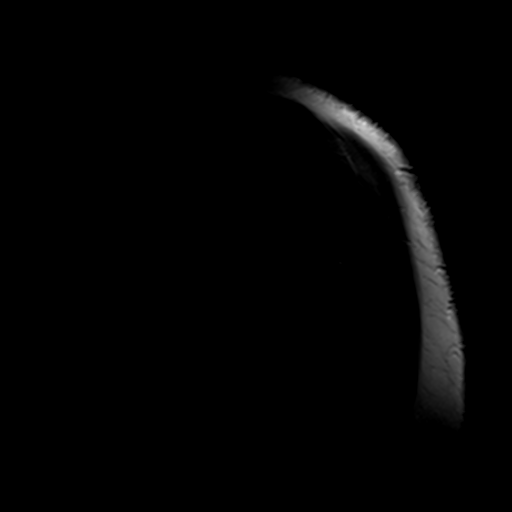
[im 3/16]
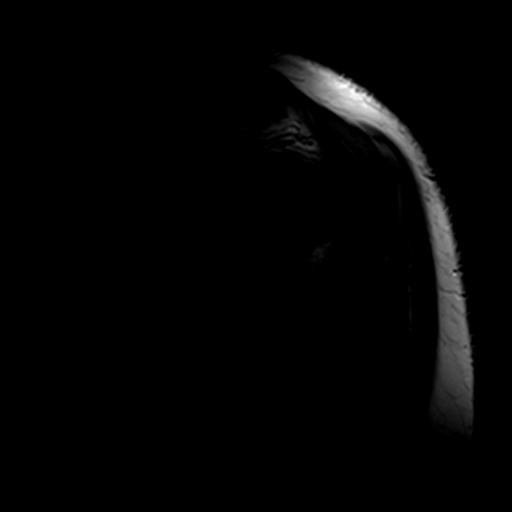
[im 5/16]
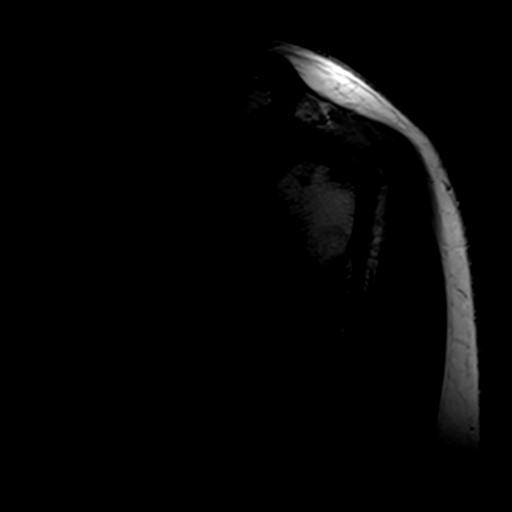
[im 7/16]
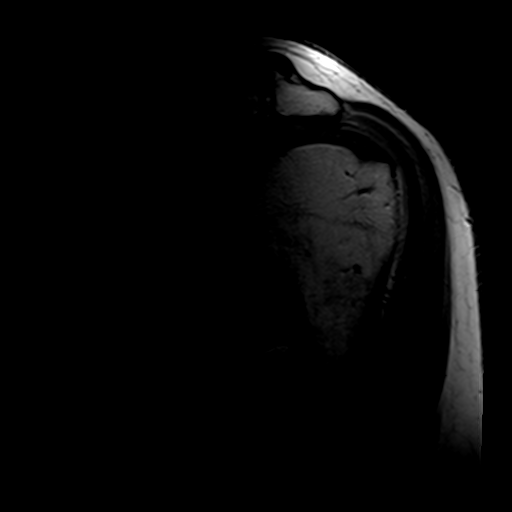
[im 9/16]
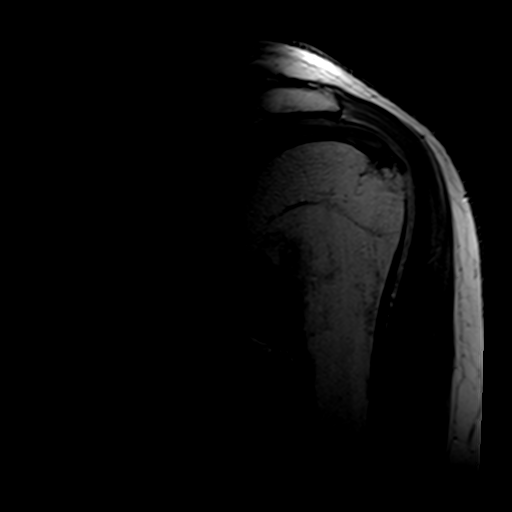
[im 11/16]
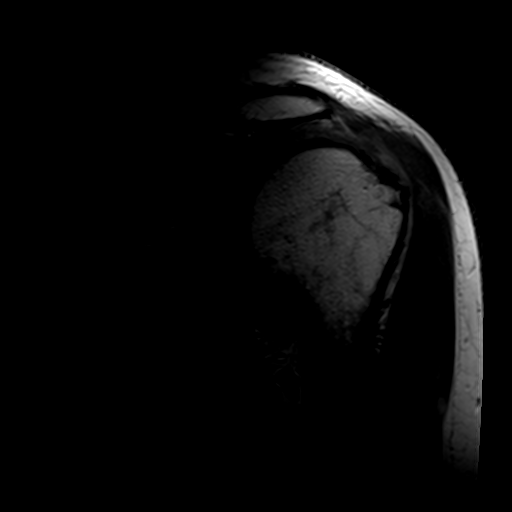
[im 13/16]
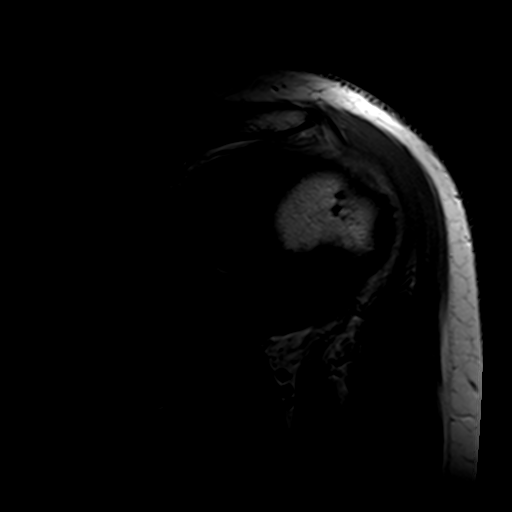
[im 16/16]
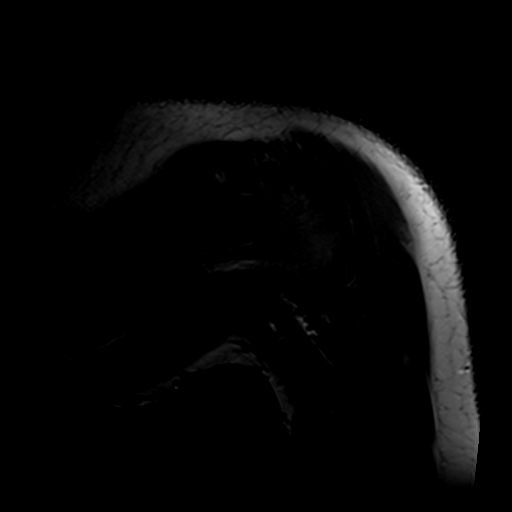

[Series 8: T1 · oblique · 4.0mm · 0.31mm/px · 5 of 18 slices shown]
[im 1/18]
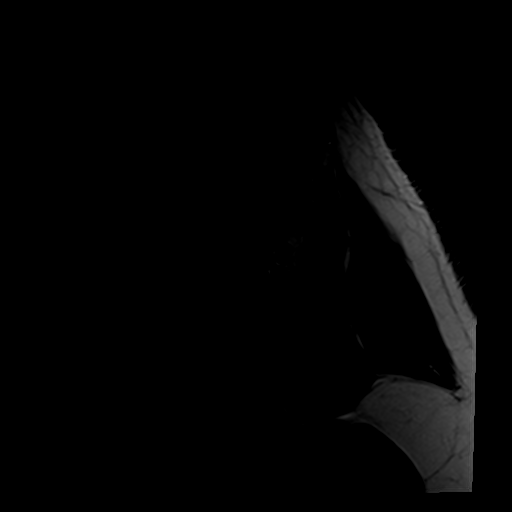
[im 3/18]
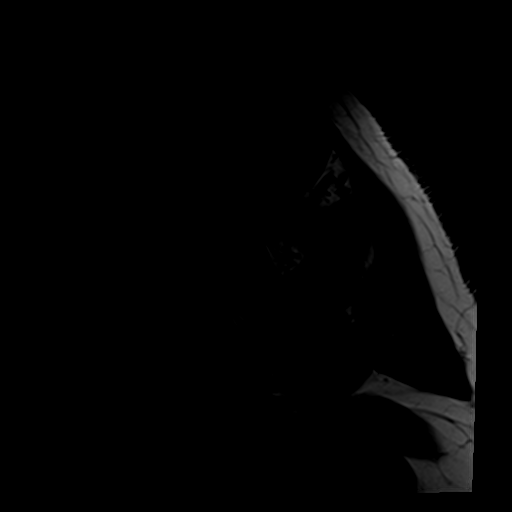
[im 5/18]
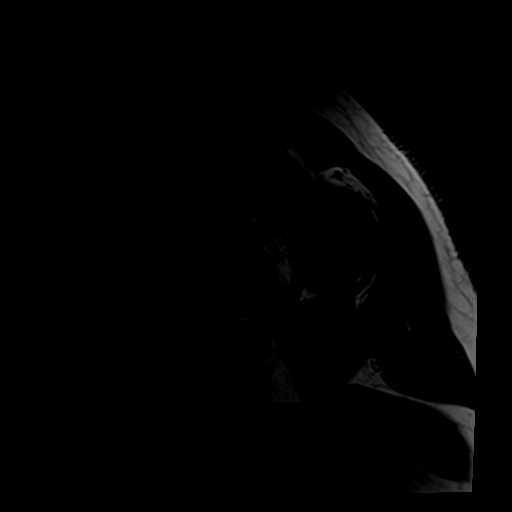
[im 9/18]
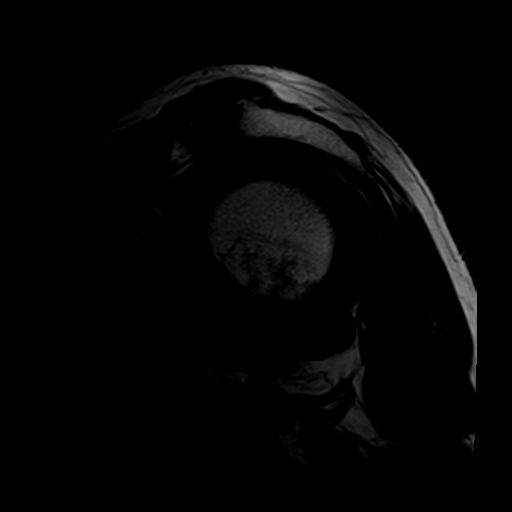
[im 15/18]
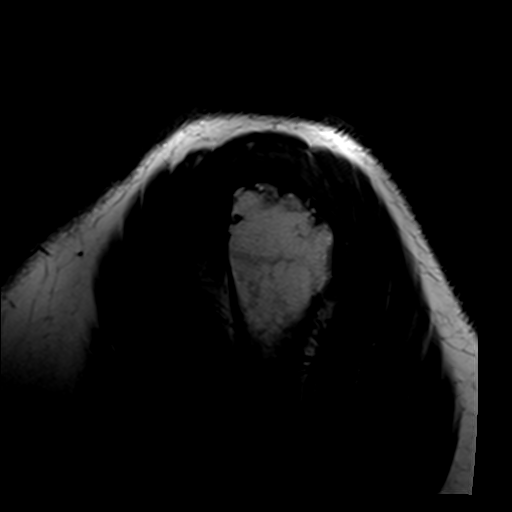

[23 of 40 positions shown; findings below may reference images not displayed]

FINDINGS: Rotator cuff: The patient has a complete infraspinatus tendon tear
with retraction to the level of the glenoid, 3-4 cm. There is
supraspinatus and subscapularis tendinopathy without tear.

Muscles:  Minimal infraspinatus atrophy is seen.  Otherwise normal.

Biceps long head:  Intact.

Acromioclavicular Joint: Moderate degenerative disease is seen. Type
2 acromion. Fluid is present in the subacromial/subdeltoid bursa.

Glenohumeral Joint: Negative.

Labrum:  Intact.

Bones:  No fracture or worrisome lesion.

Other: None.
IMPRESSION: Rotator cuff tendinopathy. The patient has a complete infraspinatus
tendon tear with 3-4 cm of retraction and only mild atrophy.

Moderate acromioclavicular osteoarthritis.

Subacromial/subdeltoid bursitis.

## 2017-07-13 ENCOUNTER — Other Ambulatory Visit: Payer: Self-pay | Admitting: Cardiovascular Disease

## 2017-08-10 ENCOUNTER — Other Ambulatory Visit: Payer: Self-pay | Admitting: Cardiovascular Disease

## 2017-08-10 DIAGNOSIS — I10 Essential (primary) hypertension: Secondary | ICD-10-CM

## 2017-08-10 DIAGNOSIS — I2510 Atherosclerotic heart disease of native coronary artery without angina pectoris: Secondary | ICD-10-CM

## 2017-08-10 DIAGNOSIS — E785 Hyperlipidemia, unspecified: Secondary | ICD-10-CM

## 2017-08-25 ENCOUNTER — Other Ambulatory Visit: Payer: Self-pay | Admitting: Cardiovascular Disease

## 2017-09-03 ENCOUNTER — Ambulatory Visit: Payer: Medicare Other | Admitting: Cardiovascular Disease

## 2017-09-11 ENCOUNTER — Encounter: Payer: Self-pay | Admitting: Cardiovascular Disease

## 2017-09-11 ENCOUNTER — Ambulatory Visit: Payer: Medicare Other | Admitting: Cardiovascular Disease

## 2017-09-11 VITALS — BP 142/60 | HR 57 | Ht 70.0 in | Wt 203.1 lb

## 2017-09-11 DIAGNOSIS — E785 Hyperlipidemia, unspecified: Secondary | ICD-10-CM

## 2017-09-11 DIAGNOSIS — I25119 Atherosclerotic heart disease of native coronary artery with unspecified angina pectoris: Secondary | ICD-10-CM

## 2017-09-11 DIAGNOSIS — I10 Essential (primary) hypertension: Secondary | ICD-10-CM | POA: Diagnosis not present

## 2017-09-11 MED ORDER — SIMVASTATIN 40 MG PO TABS: 40.0000 mg | ORAL_TABLET | Freq: Every day | ORAL | 3 refills | Status: DC

## 2017-09-11 NOTE — Progress Notes (Signed)
Cardiology Office Note Date:  09/11/2017   ID:  [name] BrownieDavid S Lorenzi, DOB 10/28/1934, MRN [id number]  PCP:  Elizabeth PalauAnderson, Teresa, FNP  Cardiologist:  Tonny BollmanMichael Kye Hedden, MD    Chief Complaint  Patient presents with  . Follow-up    CAD     History of Present Illness: [name] Marks is a 82 y.o. male who presents for followup evaluation. The patient has coronary artery disease and has undergone PCI of the LAD. In 2015 he presented with exertional angina and underwent cardiac catheterization and pressure wire analysis of moderate diffuse stenosis in the LAD. His FFR was 0.79 and because of the diffuse nature of his CAD, medical therapy was recommended.  The patient is here alone today.  He continues to feel very well.  He is active without exertional symptoms at present.  He denies chest pain, shortness of breath, chest pressure, or heart palpitations.  He has noticed some mild leg swelling that occurs at the end of the day, resolved by morning.  Also has some discomfort in his lower legs at night below the knees.  No pain with ambulation.   Past Medical History:  Diagnosis Date  . Cerebrovascular disease   . COPD (chronic obstructive pulmonary disease) (HCC)   . DJD (degenerative joint disease)   . Hyperlipidemia   . Hypertension   . Peripheral vascular disease (HCC)   . Polyposis of colon   . Subdural hematoma (HCC)    history of     Past Surgical History:  Procedure Laterality Date  . CARDIAC CATHETERIZATION  03/21/2010   Patent LAD (coronary artery) stents - No significant obstructive disease in the left circumflex -- Patent RCA with moderately severe ostial stenosis of the posterior descending (coronary) artery branch.  . CARDIAC CATHETERIZATION  08/01/2009   . LEFT HEART CATHETERIZATION WITH CORONARY ANGIOGRAM N/A 11/08/2013   Procedure: LEFT HEART CATHETERIZATION WITH CORONARY ANGIOGRAM;  Surgeon: Micheline ChapmanMichael D Beauty Pless, MD;  [facility];  Service: Cardiovascular;  Laterality:  N/A;    Current Outpatient Medications  Medication Sig Dispense Refill  . aspirin 81 MG tablet Take 81 mg by mouth daily.     Marland Kitchen. azelastine (ASTELIN) 0.1 % nasal spray Place 1 spray into both nostrils 2 (two) times daily.     . cholecalciferol (VITAMIN D) 1000 UNITS tablet Take 1,000 Units by mouth 2 (two) times daily.     . isosorbide mononitrate (IMDUR) 30 MG 24 hr tablet TAKE 1 TABLET BY MOUTH EVERY DAY 90 tablet 0  . losartan (COZAAR) 50 MG tablet Take 50 mg by mouth daily.    . metoprolol tartrate (LOPRESSOR) 25 MG tablet TAKE 1 TABLET(25 MG) BY MOUTH TWICE DAILY 180 tablet 0  . Multiple Vitamins-Minerals (PRESERVISION AREDS PO) Take 2 tablets by mouth daily.    . simvastatin (ZOCOR) 40 MG tablet Take 1 tablet (40 mg total) by mouth at bedtime. 90 tablet 3   No current facility-administered medications for this visit.     Allergies:   Patient has no known allergies.   Social History:  The patient  reports that he quit smoking about 46 years ago. He quit smokeless tobacco use about 46 years ago. His smokeless tobacco use included chew.   Family History:  The patient's  family history includes Breast cancer in his sister and sister; Heart attack in his father and sister; Heart disease in his brother.    ROS:  Please see the history of present illness. All other systems are  reviewed and negative.    PHYSICAL EXAM: VS:  BP (!) 142/60   Pulse (!) 57   Ht 5\' 10"  (1.778 m)   Wt 203 lb 1.9 oz (92.1 kg)   SpO2 94%   BMI 29.14 kg/m  , BMI Body mass index is 29.14 kg/m. GEN: Well nourished, well developed, in no acute distress  HEENT: normal  Neck: no JVD, no masses. No carotid bruits Cardiac: RRR without murmur or gallop                Respiratory:  clear to auscultation bilaterally, normal work of breathing GI: soft, nontender, nondistended, + BS MS: no deformity or atrophy  Ext: no pretibial edema, pedal pulses 2+= bilaterally Skin: warm and dry, no rash Neuro:  Strength and  sensation are intact Psych: euthymic mood, full affect  EKG:  EKG is ordered today. The ekg ordered today shows sinus bradycardia 57 bpm, normal EKG.  Recent Labs: No results found for requested labs within last 8760 hours.   Lipid Panel     Component Value Date/Time   CHOL  08/01/2009 0635    119        ATP III CLASSIFICATION:  <200     mg/dL   Desirable  161-096200-239  mg/dL   Borderline High  >=045>=240    mg/dL   High          TRIG 48 08/01/2009 0635   HDL 53 08/01/2009 0635   CHOLHDL 2.2 08/01/2009 0635   VLDL 10 08/01/2009 0635   LDLCALC  08/01/2009 0635    56        Total Cholesterol/HDL:CHD Risk Coronary Heart Disease Risk Table                     Men   Women  1/2 Average Risk   3.4   3.3  Average Risk       5.0   4.4  2 X Average Risk   9.6   7.1  3 X Average Risk  23.4   11.0        Use the calculated Patient Ratio above and the CHD Risk Table to determine the patient's CHD Risk.        ATP III CLASSIFICATION (LDL):  <100     mg/dL   Optimal  [id number]  mg/dL   Near or Above                    Optimal  130-159  mg/dL   Borderline  [id number]  mg/dL   High  >956>190     mg/dL   Very High      Wt Readings from Last 3 Encounters:  09/11/17 203 lb 1.9 oz (92.1 kg)  08/12/16 214 lb 4 oz (97.2 kg)  07/28/15 222 lb (100.7 kg)     ASSESSMENT AND PLAN: 1.  Coronary artery disease, native vessel, with angina: The patient is stable on a combination of metoprolol and isosorbide.  He takes aspirin for antiplatelet therapy and simvastatin for lipid lowering.  No changes are recommended today.  He is minimally symptomatic.  2.  Hyperlipidemia: Treated with simvastatin.  3.  Hypertension: Blood pressure is controlled on a combination of losartan, isosorbide, and metoprolol.  Current medicines are reviewed with the patient today.  The patient does not have concerns regarding medicines.  Labs/ tests ordered today include:   Orders Placed This Encounter  Procedures  . EKG  12-Lead  Disposition:   FU 1 year  Signed, Tonny Bollman, MD  09/11/2017 5:56 PM    [facility] [address] [place], [place], [place]  [address] Phone: [phone]; Fax: [phone]

## 2017-09-11 NOTE — Patient Instructions (Signed)

## 2017-10-12 ENCOUNTER — Other Ambulatory Visit: Payer: Self-pay | Admitting: Cardiovascular Disease

## 2017-11-09 ENCOUNTER — Other Ambulatory Visit: Payer: Self-pay | Admitting: Cardiovascular Disease

## 2017-11-09 DIAGNOSIS — E785 Hyperlipidemia, unspecified: Secondary | ICD-10-CM

## 2017-11-09 DIAGNOSIS — I10 Essential (primary) hypertension: Secondary | ICD-10-CM

## 2017-11-09 DIAGNOSIS — I2510 Atherosclerotic heart disease of native coronary artery without angina pectoris: Secondary | ICD-10-CM

## 2018-07-06 ENCOUNTER — Other Ambulatory Visit: Payer: Self-pay | Admitting: Cardiovascular Disease

## 2018-09-09 ENCOUNTER — Other Ambulatory Visit: Payer: Self-pay | Admitting: Cardiovascular Disease

## 2018-09-09 MED ORDER — SIMVASTATIN 40 MG PO TABS: 40.0000 mg | ORAL_TABLET | Freq: Every day | ORAL | 0 refills | Status: DC

## 2018-09-29 ENCOUNTER — Other Ambulatory Visit: Payer: Self-pay | Admitting: Cardiovascular Disease

## 2018-10-01 ENCOUNTER — Other Ambulatory Visit: Payer: Self-pay | Admitting: Cardiovascular Disease

## 2018-10-02 ENCOUNTER — Encounter: Payer: Self-pay | Admitting: Cardiovascular Disease

## 2018-10-29 ENCOUNTER — Other Ambulatory Visit: Payer: Self-pay | Admitting: Cardiovascular Disease

## 2018-10-29 DIAGNOSIS — I10 Essential (primary) hypertension: Secondary | ICD-10-CM

## 2018-10-29 DIAGNOSIS — E785 Hyperlipidemia, unspecified: Secondary | ICD-10-CM

## 2018-10-29 DIAGNOSIS — I2510 Atherosclerotic heart disease of native coronary artery without angina pectoris: Secondary | ICD-10-CM

## 2018-11-02 ENCOUNTER — Encounter (INDEPENDENT_AMBULATORY_CARE_PROVIDER_SITE_OTHER): Payer: Self-pay

## 2018-11-02 ENCOUNTER — Encounter: Payer: Self-pay | Admitting: Cardiovascular Disease

## 2018-11-02 ENCOUNTER — Ambulatory Visit: Payer: Medicare Other | Admitting: Cardiovascular Disease

## 2018-11-02 DIAGNOSIS — I10 Essential (primary) hypertension: Secondary | ICD-10-CM | POA: Diagnosis not present

## 2018-11-02 DIAGNOSIS — I25118 Atherosclerotic heart disease of native coronary artery with other forms of angina pectoris: Secondary | ICD-10-CM

## 2018-11-02 DIAGNOSIS — E785 Hyperlipidemia, unspecified: Secondary | ICD-10-CM | POA: Diagnosis not present

## 2018-11-02 MED ORDER — ISOSORBIDE MONONITRATE ER 30 MG PO TB24: 30.0000 mg | ORAL_TABLET | Freq: Every day | ORAL | 3 refills | Status: DC

## 2018-11-02 MED ORDER — METOPROLOL TARTRATE 25 MG PO TABS: 25.0000 mg | ORAL_TABLET | Freq: Two times a day (BID) | ORAL | 3 refills | Status: DC

## 2018-11-02 NOTE — Patient Instructions (Signed)

## 2018-11-02 NOTE — Progress Notes (Signed)
Cardiology Office Note:    Date:  11/02/2018   ID:  [name] Marks, DOB Feb 15, 1935, MRN [id number]  PCP:  Elizabeth Palau, FNP  Cardiologist:  Tonny Bollman, MD  Electrophysiologist:  None   Referring MD: Elizabeth Palau, FNP   Chief Complaint  Patient presents with  . Coronary Artery Disease    History of Present Illness:    [name] Marks is a 84 y.o. male with a hx of coronary artery disease and has undergone PCI of the LAD. In 2015 he presented with exertional angina and underwent cardiac catheterization and pressure wire analysis of moderate diffuse stenosis in the LAD. His FFR was 0.79 and because of the diffuse nature of his CAD, medical therapy was recommended.  The patient is here alone today.  He has been doing well without symptoms of chest pain, chest pressure, heart palpitations, lightheadedness, orthopnea, or PND.  He has had mild leg swelling that has not changed over the past few years.  He also has mild exertional dyspnea, also unchanged.  He does not have any significant limitation.  He is typically more active in the spring and summer months.  He has had some hardships in his family as his wife had Chad Nile virus a few years back and continues to have some disability from this.  His son had surgery for tongue cancer and is now living with them.  Past Medical History:  Diagnosis Date  . Cerebrovascular disease   . COPD (chronic obstructive pulmonary disease) (HCC)   . DJD (degenerative joint disease)   . Hyperlipidemia   . Hypertension   . Peripheral vascular disease (HCC)   . Polyposis of colon   . Subdural hematoma (HCC)    history of     Past Surgical History:  Procedure Laterality Date  . CARDIAC CATHETERIZATION  03/21/2010   Patent LAD (coronary artery) stents - No significant obstructive disease in the left circumflex -- Patent RCA with moderately severe ostial stenosis of the posterior descending (coronary) artery branch.  . CARDIAC  CATHETERIZATION  08/01/2009   . LEFT HEART CATHETERIZATION WITH CORONARY ANGIOGRAM N/A 11/08/2013   Procedure: LEFT HEART CATHETERIZATION WITH CORONARY ANGIOGRAM;  Surgeon: Micheline Chapman, MD;  Location: [facility] CATH LAB;  Service: Cardiovascular;  Laterality: N/A;    Current Medications: Current Meds  Medication Sig  . aspirin 81 MG tablet Take 81 mg by mouth daily.   Marland Kitchen azelastine (ASTELIN) 0.1 % nasal spray Place 1 spray into both nostrils 2 (two) times daily.   . cholecalciferol (VITAMIN D) 1000 UNITS tablet Take 1,000 Units by mouth 2 (two) times daily.   . isosorbide mononitrate (IMDUR) 30 MG 24 hr tablet Take 1 tablet (30 mg total) by mouth daily.  Marland Kitchen loratadine (CLARITIN) 10 MG tablet Take 1 tablet by mouth daily.  Marland Kitchen losartan (COZAAR) 50 MG tablet Take 50 mg by mouth daily.  . metoprolol tartrate (LOPRESSOR) 25 MG tablet Take 1 tablet (25 mg total) by mouth 2 (two) times daily.  . Multiple Vitamins-Minerals (PRESERVISION AREDS PO) Take 2 tablets by mouth daily.  . simvastatin (ZOCOR) 40 MG tablet Take 1 tablet (40 mg total) by mouth at bedtime. Please keep upcoming appt in January with Dr. Excell Seltzer for future refills. Thank you  . [DISCONTINUED] isosorbide mononitrate (IMDUR) 30 MG 24 hr tablet TAKE 1 TABLET BY MOUTH EVERY DAY  . [DISCONTINUED] metoprolol tartrate (LOPRESSOR) 25 MG tablet Take 1 tablet (25 mg total) by mouth 2 (two) times daily. Please  keep upcoming appt for future refills. Thank you     Allergies:   Patient has no known allergies.   Social History   Socioeconomic History  . Marital status: Married    Spouse name: Not on file  . Number of children: Not on file  . Years of education: Not on file  . Highest education level: Not on file  Occupational History  . Not on file  Social Needs  . Financial resource strain: Not on file  . Food insecurity:    Worry: Not on file    Inability: Not on file  . Transportation needs:    Medical: Not on file    Non-medical: Not  on file  Tobacco Use  . Smoking status: Former Smoker    Last attempt to quit: 09/09/1971    Years since quitting: 47.1  . Smokeless tobacco: Former Neurosurgeon    Types: Chew    Quit date: 09/09/1971  Substance and Sexual Activity  . Alcohol use: Never    Frequency: Never  . Drug use: Never  . Sexual activity: Not on file  Lifestyle  . Physical activity:    Days per week: Not on file    Minutes per session: Not on file  . Stress: Not on file  Relationships  . Social connections:    Talks on phone: Not on file    Gets together: Not on file    Attends religious service: Not on file    Active member of club or organization: Not on file    Attends meetings of clubs or organizations: Not on file    Relationship status: Not on file  Other Topics Concern  . Not on file  Social History Narrative  . Not on file     Family History: The patient's family history includes Breast cancer in his sister and sister; Heart attack in his father and sister; Heart disease in his brother.  ROS:   Please see the history of present illness.    All other systems reviewed and are negative.  EKGs/Labs/Other Studies Reviewed:    EKG:  EKG is here [name] Marks ordered today.  The ekg ordered today demonstrates sinus rhythm 62 bpm, voltage criteria for LVH may be normal variant  Recent Labs:o results found for requested labs within last 8760 hours.  Recent Lipid Panel    Component Value Date/Time   CHOL  08/01/2009 0635    119        ATP III CLASSIFICATION:  <200     mg/dL   Desirable  626-948  mg/dL   Borderline High  >=546    mg/dL   High          TRIG 48 08/01/2009 0635   HDL 53 08/01/2009 0635   CHOLHDL 2.2 08/01/2009 0635   VLDL 10 08/01/2009 0635   LDLCALC  08/01/2009 0635    56        Total Cholesterol/HDL:CHD Risk Coronary Heart Disease Risk Table                     Men   Women  1/2 Average Risk   3.4   3.3  Average Risk       5.0   4.4  2 X Average Risk   9.6   7.1  3 X Average  Risk  23.4   11.0        Use the calculated Patient Ratio above and the CHD Risk Table to determine  the patient's CHD Risk.        ATP III CLASSIFICATION (LDL):  <100     mg/dL   Optimal  161-096100-129  mg/dL   Near or Above                    Optimal  130-159  mg/dL   Borderline  045-409160-189  mg/dL   High  >811>190     mg/dL   Very High    Physical Exam:    VS:  BP 138/78   Pulse 62   Ht 5\' 10"  (1.778 m)   Wt 226 lb 6.4 oz (102.7 kg)   SpO2 98%   BMI 32.49 kg/m     Wt Readings from Last 3 Encounters:  11/02/18 226 lb 6.4 oz (102.7 kg)  09/11/17 203 lb 1.9 oz (92.1 kg)  08/12/16 214 lb 4 oz (97.2 kg)     GEN:  Well nourished, well developed in no acute distress HEENT: Normal NECK: No JVD; No carotid bruits LYMPHATICS: No lymphadenopathy CARDIAC: RRR, no murmurs, rubs, gallops RESPIRATORY:  Clear to auscultation without rales, wheezing or rhonchi  ABDOMEN: Soft, non-tender, non-distended MUSCULOSKELETAL:  No edema; No deformity  SKIN: Warm and dry NEUROLOGIC:  Alert and oriented x 3 PSYCHIATRIC:  Normal affect   ASSESSMENT:    1. Hyperlipidemia, unspecified hyperlipidemia type   2. Essential hypertension   3. Coronary artery disease involving native coronary artery of native heart with other form of angina pectoris (HCC)    PLAN:    In order of problems listed above:  1. Lipids are reviewed through care everywhere with a cholesterol of 138, triglycerides 64, HDL 57, and LDL 68.  LFTs are within normal limits. 2. Blood pressure is well controlled on metoprolol and losartan. 3. The patient is stable without symptoms of angina.  We will continue with the program of medical management.  He is treated with isosorbide and metoprolol.  He is on antiplatelet therapy with aspirin and he takes a statin drug.   Medication Adjustments/Labs and Tests Ordered: Current medicines are reviewed at length with the patient today.  Concerns regarding medicines are outlined above.  Orders  Placed This Encounter  Procedures  . EKG 12-Lead   Meds ordered this encounter  Medications  . isosorbide mononitrate (IMDUR) 30 MG 24 hr tablet    Sig: Take 1 tablet (30 mg total) by mouth daily.    Dispense:  90 tablet    Refill:  3  . metoprolol tartrate (LOPRESSOR) 25 MG tablet    Sig: Take 1 tablet (25 mg total) by mouth 2 (two) times daily.    Dispense:  180 tablet    Refill:  3    Patient Instructions  Medication Instructions:  Your provider recommends that you continue on your current medications as directed. Please refer to the Current Medication list given to you today.    Labwork: None  Testing/Procedures: None  Follow-Up: Your provider wants you to follow-up in: 1 year with Dr. Excell Seltzerooper. You will receive a reminder letter in the mail two months in advance. If you don't receive a letter, please call our office to schedule the follow-up appointment.    Any Other Special Instructions Will Be Listed Below (If Applicable).     If you need a refill on your cardiac medications before your next appointment, please call your pharmacy.      Signed, Tonny BollmanMichael Ernestene Coover, MD  11/02/2018 8:48 AM    [facility] 

## 2018-12-01 ENCOUNTER — Other Ambulatory Visit: Payer: Self-pay | Admitting: Cardiovascular Disease

## 2019-05-21 ENCOUNTER — Inpatient Hospital Stay (HOSPITAL_COMMUNITY)
Admission: EM | Admit: 2019-05-21 | Discharge: 2019-05-23 | DRG: 065 | Disposition: A | Discharge status: Home / Self Care | Payer: Medicare Other | Attending: Neurology | Admitting: Neurology

## 2019-05-21 ENCOUNTER — Inpatient Hospital Stay (HOSPITAL_COMMUNITY): Payer: Medicare Other

## 2019-05-21 ENCOUNTER — Encounter (HOSPITAL_COMMUNITY): Payer: Self-pay

## 2019-05-21 ENCOUNTER — Emergency Department (HOSPITAL_COMMUNITY): Payer: Medicare Other

## 2019-05-21 ENCOUNTER — Other Ambulatory Visit: Payer: Self-pay

## 2019-05-21 DIAGNOSIS — M25512 Pain in left shoulder: Secondary | ICD-10-CM

## 2019-05-21 DIAGNOSIS — Z803 Family history of malignant neoplasm of breast: Secondary | ICD-10-CM

## 2019-05-21 DIAGNOSIS — R29702 NIHSS score 2: Secondary | ICD-10-CM | POA: Diagnosis present

## 2019-05-21 DIAGNOSIS — Z79899 Other long term (current) drug therapy: Secondary | ICD-10-CM | POA: Diagnosis not present

## 2019-05-21 DIAGNOSIS — I739 Peripheral vascular disease, unspecified: Secondary | ICD-10-CM | POA: Diagnosis present

## 2019-05-21 DIAGNOSIS — Z7982 Long term (current) use of aspirin: Secondary | ICD-10-CM

## 2019-05-21 DIAGNOSIS — Z87891 Personal history of nicotine dependence: Secondary | ICD-10-CM | POA: Diagnosis not present

## 2019-05-21 DIAGNOSIS — I609 Nontraumatic subarachnoid hemorrhage, unspecified: Secondary | ICD-10-CM | POA: Diagnosis present

## 2019-05-21 DIAGNOSIS — E785 Hyperlipidemia, unspecified: Secondary | ICD-10-CM | POA: Diagnosis present

## 2019-05-21 DIAGNOSIS — M1990 Unspecified osteoarthritis, unspecified site: Secondary | ICD-10-CM | POA: Diagnosis present

## 2019-05-21 DIAGNOSIS — I6200 Nontraumatic subdural hemorrhage, unspecified: Secondary | ICD-10-CM | POA: Diagnosis present

## 2019-05-21 DIAGNOSIS — I2510 Atherosclerotic heart disease of native coronary artery without angina pectoris: Secondary | ICD-10-CM | POA: Diagnosis present

## 2019-05-21 DIAGNOSIS — Z955 Presence of coronary angioplasty implant and graft: Secondary | ICD-10-CM | POA: Diagnosis not present

## 2019-05-21 DIAGNOSIS — M7542 Impingement syndrome of left shoulder: Secondary | ICD-10-CM

## 2019-05-21 DIAGNOSIS — R001 Bradycardia, unspecified: Secondary | ICD-10-CM | POA: Diagnosis present

## 2019-05-21 DIAGNOSIS — I340 Nonrheumatic mitral (valve) insufficiency: Secondary | ICD-10-CM | POA: Diagnosis not present

## 2019-05-21 DIAGNOSIS — E669 Obesity, unspecified: Secondary | ICD-10-CM | POA: Diagnosis present

## 2019-05-21 DIAGNOSIS — Z8249 Family history of ischemic heart disease and other diseases of the circulatory system: Secondary | ICD-10-CM

## 2019-05-21 DIAGNOSIS — H547 Unspecified visual loss: Secondary | ICD-10-CM | POA: Diagnosis present

## 2019-05-21 DIAGNOSIS — I10 Essential (primary) hypertension: Secondary | ICD-10-CM | POA: Diagnosis present

## 2019-05-21 DIAGNOSIS — Z6830 Body mass index (BMI) 30.0-30.9, adult: Secondary | ICD-10-CM

## 2019-05-21 DIAGNOSIS — I161 Hypertensive emergency: Secondary | ICD-10-CM | POA: Diagnosis present

## 2019-05-21 DIAGNOSIS — I619 Nontraumatic intracerebral hemorrhage, unspecified: Secondary | ICD-10-CM | POA: Diagnosis not present

## 2019-05-21 DIAGNOSIS — I259 Chronic ischemic heart disease, unspecified: Secondary | ICD-10-CM

## 2019-05-21 DIAGNOSIS — I611 Nontraumatic intracerebral hemorrhage in hemisphere, cortical: Principal | ICD-10-CM | POA: Diagnosis present

## 2019-05-21 DIAGNOSIS — J449 Chronic obstructive pulmonary disease, unspecified: Secondary | ICD-10-CM | POA: Diagnosis present

## 2019-05-21 DIAGNOSIS — Z20828 Contact with and (suspected) exposure to other viral communicable diseases: Secondary | ICD-10-CM | POA: Diagnosis present

## 2019-05-21 DIAGNOSIS — I610 Nontraumatic intracerebral hemorrhage in hemisphere, subcortical: Secondary | ICD-10-CM

## 2019-05-21 DIAGNOSIS — H53461 Homonymous bilateral field defects, right side: Secondary | ICD-10-CM | POA: Diagnosis present

## 2019-05-21 LAB — COMPREHENSIVE METABOLIC PANEL
ALT: 23 U/L (ref 0–44)
AST: 29 U/L (ref 15–41)
Albumin: 4 g/dL (ref 3.5–5.0)
Alkaline Phosphatase: 63 U/L (ref 38–126)
Anion gap: 11 (ref 5–15)
BUN: 21 mg/dL (ref 8–23)
CO2: 21 mmol/L — ABNORMAL LOW (ref 22–32)
Calcium: 9.4 mg/dL (ref 8.9–10.3)
Chloride: 107 mmol/L (ref 98–111)
Creatinine, Ser: 1.26 mg/dL — ABNORMAL HIGH (ref 0.61–1.24)
GFR calc Af Amer: >60 mL/min (ref >60)
GFR calc non Af Amer: 53 mL/min — ABNORMAL LOW (ref >60)
Glucose, Bld: 171 mg/dL — ABNORMAL HIGH (ref 70–99)
Potassium: 3.6 mmol/L (ref 3.5–5.1)
Sodium: 139 mmol/L (ref 135–145)
Total Bilirubin: 0.8 mg/dL (ref 0.3–1.2)
Total Protein: 7.1 g/dL (ref 6.5–8.1)

## 2019-05-21 LAB — LIPID PANEL
Cholesterol: 123 mg/dL (ref 0–200)
HDL: 52 mg/dL (ref >40)
LDL Cholesterol: 57 mg/dL (ref 0–99)
Total CHOL/HDL Ratio: 2.4 RATIO
Triglycerides: 72 mg/dL (ref <150)
VLDL: 14 mg/dL (ref 0–40)

## 2019-05-21 LAB — CBC
HCT: 39.9 % (ref 39.0–52.0)
Hemoglobin: 13.5 g/dL (ref 13.0–17.0)
MCH: 32.9 pg (ref 26.0–34.0)
MCHC: 33.8 g/dL (ref 30.0–36.0)
MCV: 97.3 fL (ref 80.0–100.0)
Platelets: 236 10*3/uL (ref 150–400)
RBC: 4.1 MIL/uL — ABNORMAL LOW (ref 4.22–5.81)
RDW: 13.6 % (ref 11.5–15.5)
WBC: 10.3 10*3/uL (ref 4.0–10.5)
nRBC: 0 % (ref 0.0–0.2)

## 2019-05-21 LAB — DIFFERENTIAL
Abs Immature Granulocytes: 0.03 10*3/uL (ref 0.00–0.07)
Basophils Absolute: 0.1 10*3/uL (ref 0.0–0.1)
Basophils Relative: 1 %
Eosinophils Absolute: 0.4 10*3/uL (ref 0.0–0.5)
Eosinophils Relative: 4 %
Immature Granulocytes: 0 %
Lymphocytes Relative: 17 %
Lymphs Abs: 1.8 10*3/uL (ref 0.7–4.0)
Monocytes Absolute: 0.7 10*3/uL (ref 0.1–1.0)
Monocytes Relative: 7 %
Neutro Abs: 7.4 10*3/uL (ref 1.7–7.7)
Neutrophils Relative %: 71 %

## 2019-05-21 LAB — APTT: aPTT: 29 seconds (ref 24–36)

## 2019-05-21 LAB — PROTIME-INR
INR: 1.1 (ref 0.8–1.2)
Prothrombin Time: 13.8 seconds (ref 11.4–15.2)

## 2019-05-21 LAB — HEMOGLOBIN A1C
Hgb A1c MFr Bld: 5.8 % — ABNORMAL HIGH (ref 4.8–5.6)
Mean Plasma Glucose: 119.76 mg/dL

## 2019-05-21 LAB — SARS CORONAVIRUS 2 BY RT PCR (HOSPITAL ORDER, PERFORMED IN ~~LOC~~ HOSPITAL LAB): SARS Coronavirus 2: NEGATIVE

## 2019-05-21 LAB — MRSA PCR SCREENING: MRSA by PCR: NEGATIVE

## 2019-05-21 MED ORDER — STROKE: EARLY STAGES OF RECOVERY BOOK
Freq: Once | Status: AC
  Administered 2019-05-22: 1
  Filled 2019-05-21: qty 1

## 2019-05-21 MED ORDER — CLEVIDIPINE BUTYRATE 0.5 MG/ML IV EMUL
0.0000 mg/h | INTRAVENOUS | Status: DC
  Administered 2019-05-21: 4 mg/h via INTRAVENOUS
  Administered 2019-05-22: 1 mg/h via INTRAVENOUS
  Filled 2019-05-21 (×2): qty 50

## 2019-05-21 MED ORDER — LABETALOL HCL 5 MG/ML IV SOLN
10.0000 mg | Freq: Once | INTRAVENOUS | Status: AC
  Administered 2019-05-21: 10 mg via INTRAVENOUS
  Filled 2019-05-21: qty 4

## 2019-05-21 MED ORDER — PANTOPRAZOLE SODIUM 40 MG IV SOLR
40.0000 mg | Freq: Every day | INTRAVENOUS | Status: DC
  Administered 2019-05-21 – 2019-05-22 (×2): 40 mg via INTRAVENOUS
  Filled 2019-05-21 (×2): qty 40

## 2019-05-21 MED ORDER — CLEVIDIPINE BUTYRATE 0.5 MG/ML IV EMUL
0.0000 mg/h | INTRAVENOUS | Status: DC
  Administered 2019-05-21: 1 mg/h via INTRAVENOUS
  Filled 2019-05-21: qty 50

## 2019-05-21 MED ORDER — ACETAMINOPHEN 650 MG RE SUPP: 650.0000 mg | RECTAL | Status: DC | PRN

## 2019-05-21 MED ORDER — ACETAMINOPHEN 160 MG/5ML PO SOLN: 650.0000 mg | ORAL | Status: DC | PRN

## 2019-05-21 MED ORDER — CHLORHEXIDINE GLUCONATE CLOTH 2 % EX PADS
6.0000 | MEDICATED_PAD | Freq: Every day | CUTANEOUS | Status: DC
  Administered 2019-05-21 – 2019-05-22 (×2): 6 via TOPICAL

## 2019-05-21 MED ORDER — SENNOSIDES-DOCUSATE SODIUM 8.6-50 MG PO TABS
1.0000 | ORAL_TABLET | Freq: Two times a day (BID) | ORAL | Status: DC
  Administered 2019-05-21 – 2019-05-22 (×3): 1 via ORAL
  Filled 2019-05-21 (×4): qty 1

## 2019-05-21 MED ORDER — GADOBUTROL 1 MMOL/ML IV SOLN
10.0000 mL | Freq: Once | INTRAVENOUS | Status: AC | PRN
  Administered 2019-05-21: 10 mL via INTRAVENOUS

## 2019-05-21 MED ORDER — ACETAMINOPHEN 325 MG PO TABS
650.0000 mg | ORAL_TABLET | ORAL | Status: DC | PRN
  Administered 2019-05-22 (×2): 650 mg via ORAL
  Filled 2019-05-21 (×2): qty 2

## 2019-05-21 MED ORDER — SODIUM CHLORIDE 0.9% FLUSH
3.0000 mL | Freq: Once | INTRAVENOUS | Status: AC
  Administered 2019-05-21: 3 mL via INTRAVENOUS

## 2019-05-21 MED ORDER — LABETALOL HCL 5 MG/ML IV SOLN: 20.0000 mg | Freq: Once | INTRAVENOUS | Status: DC

## 2019-05-21 NOTE — H&P (Addendum)
Neurology Consultation CC: right sided visual field cut  History is obtained from: Patient  HPI: [name] Marks is a 84 y.o. male PMH of coronary artery disease, HTN, HLD, h/o SDH in past-listed in the chart but both wife and patient deny that history-, now presenting with sudden onset right sided visual field loss. He went to bed sometime last night and woke up this morning at 6 AM with inability to see table and bradycardia on the right side.  He went to his eye doctor for evaluation and was sent in for stroke evaluation to the ER. He reports that while on the way to the doctor's office he was unable to see the traffic lights on the right. He does not have any headache.  Denies any tingling numbness weakness. He can not tell me why a diagnosis of subdural hematoma is on his chart-he denies any prior bleeds.  LKW: 2130 hrs 05/20/2019 tpa given?: no, ICH Premorbid modified Rankin scale (mRS): 0 ICH score 1   ROS:  ROS was performed and is negative except as noted in the HPI.   Past Medical History:  Diagnosis Date  . Cerebrovascular disease   . COPD (chronic obstructive pulmonary disease) (HCC)   . DJD (degenerative joint disease)   . Hyperlipidemia   . Hypertension   . Peripheral vascular disease (HCC)   . Polyposis of colon   . Subdural hematoma (HCC)    history of     Family History  Problem Relation Age of Onset  . Heart attack Father   . Heart attack Sister   . Breast cancer Sister   . Breast cancer Sister   . Heart disease Brother    Social History:   reports that he quit smoking about 47 years ago. He quit smokeless tobacco use about 47 years ago.  His smokeless tobacco use included chew. He reports that he does not drink alcohol or use drugs.  Medications  Current Facility-Administered Medications:  .  sodium chloride flush (NS) 0.9 % injection 3 mL, 3 mL, Intravenous, Once, Sabas SousBero, Michael M, MD  Current Outpatient Medications:  .  aspirin 81 MG tablet, Take  81 mg by mouth daily. , Disp: , Rfl:  .  azelastine (ASTELIN) 0.1 % nasal spray, Place 1 spray into both nostrils 2 (two) times daily. , Disp: , Rfl:  .  cholecalciferol (VITAMIN D) 1000 UNITS tablet, Take 1,000 Units by mouth 2 (two) times daily. , Disp: , Rfl:  .  isosorbide mononitrate (IMDUR) 30 MG 24 hr tablet, Take 1 tablet (30 mg total) by mouth daily., Disp: 90 tablet, Rfl: 3 .  loratadine (CLARITIN) 10 MG tablet, Take 1 tablet by mouth daily., Disp: , Rfl:  .  losartan (COZAAR) 50 MG tablet, Take 50 mg by mouth daily., Disp: , Rfl:  .  metoprolol tartrate (LOPRESSOR) 25 MG tablet, Take 1 tablet (25 mg total) by mouth 2 (two) times daily., Disp: 180 tablet, Rfl: 3 .  Multiple Vitamins-Minerals (PRESERVISION AREDS PO), Take 2 tablets by mouth daily., Disp: , Rfl:  .  simvastatin (ZOCOR) 40 MG tablet, TAKE 1 TABLET BY MOUTH EVERY NIGHT AT BEDTIME, Disp: 90 tablet, Rfl: 3   Exam: Current vital signs: BP (!) 160/76 (BP Location: Right Arm)   Pulse 78   Temp 98.4 F (36.9 C) (Oral)   Resp 16   SpO2 99%  Vital signs in last 24 hours: Temp:  [98.4 F (36.9 C)] 98.4 F (36.9 C) (08/14 1550) Pulse  Rate:  [78-88] 78 (08/14 1807) Resp:  [16] 16 (08/14 1807) BP: (160-162)/(76-83) 160/76 (08/14 1807) SpO2:  [96 %-99 %] 99 % (08/14 1807)  GENERAL: Awake, alert in NAD HEENT: - Normocephalic and atraumatic, dry mm, no LN++, no Thyromegally LUNGS - Clear to auscultation bilaterally with no wheezes CV - S1S2 RRR, no m/r/g, equal pulses bilaterally. ABDOMEN - Soft, nontender, nondistended with normoactive BS Ext: warm, well perfused, intact peripheral pulses,no edema  NEURO:  Mental Status: AA&Ox3  Language: speech is clear.  Naming, repetition, fluency, and comprehension intact. Cranial Nerves: PERRL. EOMI, visual fields examination shows right homonymous hemianopsia, no facial asymmetry, facial sensation intact, hearing intact, tongue/uvula/soft palate midline, normal sternocleidomastoid  and trapezius muscle strength. No evidence of tongue atrophy or fibrillations Motor: 5/5 with no drift Tone: is normal and bulk is normal Sensation- Intact to light touch bilaterally Coordination: FTN intact bilaterally, no ataxia in BLE. Gait- deferred NIHSS 2 ICH score 1 for age  Labs I have reviewed labs in epic and the results pertinent to this consultation are:  CBC    Component Value Date/Time   WBC 10.3 05/21/2019 1627   RBC 4.10 (L) 05/21/2019 1627   HGB 13.5 05/21/2019 1627   HCT 39.9 05/21/2019 1627   PLT 236 05/21/2019 1627   MCV 97.3 05/21/2019 1627   MCH 32.9 05/21/2019 1627   MCHC 33.8 05/21/2019 1627   RDW 13.6 05/21/2019 1627   LYMPHSABS 1.8 05/21/2019 1627   MONOABS 0.7 05/21/2019 1627   EOSABS 0.4 05/21/2019 1627   BASOSABS 0.1 05/21/2019 1627    CMP     Component Value Date/Time   NA 139 05/21/2019 1627   K 3.6 05/21/2019 1627   CL 107 05/21/2019 1627   CO2 21 (L) 05/21/2019 1627   GLUCOSE 171 (H) 05/21/2019 1627   BUN 21 05/21/2019 1627   CREATININE 1.26 (H) 05/21/2019 1627   CALCIUM 9.4 05/21/2019 1627   PROT 7.1 05/21/2019 1627   ALBUMIN 4.0 05/21/2019 1627   AST 29 05/21/2019 1627   ALT 23 05/21/2019 1627   ALKPHOS 63 05/21/2019 1627   BILITOT 0.8 05/21/2019 1627   GFRNONAA 53 (L) 05/21/2019 1627   GFRAA >60 05/21/2019 1627   Imaging I have reviewed the images obtained: CT-scan of the brain-left occipitotemporal ICH.  Trace SAH.  Trace SDH.  Assessment: 84 year old man with above past medical history with sudden onset of right-sided visual field cut upon waking up this morning.  Outpatient l ophthalmology evaluation, sent to ER for stroke evaluation. Noncontrast CT of the head shows a left occipitotemporal ICH. Examination consistent with right homonymous hemianopsia. Denies any other neurological symptoms Blood pressure on arrival in the 160s-given labetalol x1 and Cleviprex ordered for blood pressure control in the  ER.  Plan: Subcortical ICH, nontraumatic Cortical ICH, nontraumatic Acuity: Acute Laterality: left Current suspected etiology: HTN, evaluate for amyloid or underlying vascular malformation   Treatment: -Admit to ICU -ICH Score: 1 -ICH Volume: <30 -BP control goal SYS<140 -PT/OT/ST  -neuromonitoring  Repeat head CT at 1 AM CTA head and neck MRI brain with and without contrast   CNS -Close neuro monitoring -Continue PT/OT/ST  RESP No active issues  CV Hypertensive emergency -Systolic blood pressure goal less than 140 -Use labetalol and Cleviprex -TTE  GI/GU -Gentle hydration  HEME -Monitor -transfuse for hgb < 7  ENDO No active issues  Fluid/Electrolyte Disorders -Repeat labs -replet as needed  ID No active issues  Prophylaxis DVT: SCD  GI: PPI  Bowel: doc senna   Dispo: home after BP management and ICH stabilization  Diet: NPO until cleared by speech or bedside eval  Code Status: Full Code    THE FOLLOWING WERE PRESENT ON ADMISSION: ICH, hypertensive emergency   -- Amie Portland, MD [facility] Pager: [phone] If 7pm to 7am, please call on call as listed on AMION.   CRITICAL CARE ATTESTATION Performed by: Amie Portland, MD Total critical care time: 40 minutes Critical care time was exclusive of separately billable procedures and treating other patients and/or supervising APPs/Residents/Students Critical care was necessary to treat or prevent imminent or life-threatening deterioration due to [place]  This patient is critically ill and at significant risk for neurological worsening and/or death and care requires constant monitoring. Critical care was time spent personally by me on the following activities: development of treatment plan with patient and/or surrogate as well as nursing, discussions with consultants, evaluation of patient's response to treatment, examination of patient, obtaining history from patient or surrogate,  ordering and performing treatments and interventions, ordering and review of laboratory studies, ordering and review of radiographic studies, pulse oximetry, re-evaluation of patient's condition, participation in multidisciplinary rounds and medical decision making of high complexity in the care of this patient.

## 2019-05-21 NOTE — ED Triage Notes (Signed)
Pt reports right eye peripheral vision loss since waking up at 6am. Pt sent here by eye doctor to rule out stroke. Pt c.o left eye pain from 6am-11am. No weakness, numbness, tingling or any other neuro symptoms noted. Pt a.o. Pupils equal and reactive.

## 2019-05-21 NOTE — ED Notes (Signed)
Attempted report x1. 

## 2019-05-21 NOTE — ED Provider Notes (Signed)
[facility] Emergency Department Provider Note MRN:  [id number]  Arrival date & time: 05/21/19     Chief Complaint   Loss of Vision   History of Present Illness   [name] Marks is a 84 y.o. year-old male with a history of COPD, hypertension presenting to the ED with chief complaint of loss of vision.  Patient woke up at 8 AM this morning and noticed that the right sided vision of his right eye was missing.  Denies pain, no trauma, no headache, no nausea, vomiting, chest pain, shortness of breath.  Sent here by ophthalmologist for evaluation.  Review of Systems  A complete 10 system review of systems was obtained and all systems are negative except as noted in the HPI and PMH.   Patient's Health History    Past Medical History:  Diagnosis Date  . Cerebrovascular disease   . COPD (chronic obstructive pulmonary disease) ([place])   . DJD (degenerative joint disease)   . Hyperlipidemia   . Hypertension   . Peripheral vascular disease ([place])   . Polyposis of colon   . Subdural hematoma (HCC)    history of     Past Surgical History:  Procedure Laterality Date  . CARDIAC CATHETERIZATION  03/21/2010   Patent LAD (coronary artery) stents - No significant obstructive disease in the left circumflex -- Patent RCA with moderately severe ostial stenosis of the posterior descending (coronary) artery branch.  . CARDIAC CATHETERIZATION  08/01/2009   . LEFT HEART CATHETERIZATION WITH CORONARY ANGIOGRAM N/A 11/08/2013   Procedure: LEFT HEART CATHETERIZATION WITH CORONARY ANGIOGRAM;  Surgeon: Blane Ohara, MD;  Location: [facility] CATH LAB;  Service: Cardiovascular;  Laterality: N/A;    Family History  Problem Relation Age of Onset  . Heart attack Father   . Heart attack Sister   . Breast cancer Sister   . Breast cancer Sister   . Heart disease Brother     Social History   Socioeconomic History  . Marital status: Married    Spouse name: Not on file  . Number of children:  Not on file  . Years of education: Not on file  . Highest education level: Not on file  Occupational History  . Not on file  Social Needs  . Financial resource strain: Not on file  . Food insecurity    Worry: Not on file    Inability: Not on file  . Transportation needs    Medical: Not on file    Non-medical: Not on file  Tobacco Use  . Smoking status: Former Smoker    Quit date: 09/09/1971    Years since quitting: 47.7  . Smokeless tobacco: Former Systems developer    Types: [place] date: 09/09/1971  Substance and Sexual Activity  . Alcohol use: Never    Frequency: Never  . Drug use: Never  . Sexual activity: Not on file  Lifestyle  . Physical activity    Days per week: Not on file    Minutes per session: Not on file  . Stress: Not on file  Relationships  . Social Herbalist on phone: Not on file    Gets together: Not on file    Attends religious service: Not on file    Active member of club or organization: Not on file    Attends meetings of clubs or organizations: Not on file    Relationship status: Not on file  . Intimate partner violence  Fear of current or ex partner: Not on file    Emotionally abused: Not on file    Physically abused: Not on file    Forced sexual activity: Not on file  Other Topics Concern  . Not on file  Social History Narrative  . Not on file     Physical Exam  Vital Signs and Nursing Notes reviewed Vitals:   05/21/19 1550 05/21/19 1807  BP: (!) 162/83 (!) 160/76  Pulse: 88 78  Resp: 16 16  Temp: 98.4 F (36.9 C)   SpO2: 96% 99%    CONSTITUTIONAL: Well-appearing, NAD NEURO:  Alert and oriented x 3, no focal deficits EYES:  eyes equal and reactive; visual field cut lateral aspect of right eye ENT/NECK:  no LAD, no JVD CARDIO: Regular rate, well-perfused, normal S1 and S2 PULM:  CTAB no wheezing or rhonchi GI/GU:  normal bowel sounds, non-distended, non-tender MSK/SPINE:  No gross deformities, no edema SKIN:  no rash,  atraumatic PSYCH:  Appropriate speech and behavior  Diagnostic and Interventional Summary    EKG Interpretation  Date/Time:  Friday May 21 2019 18:32:22 EDT Ventricular Rate:  75 PR Interval:    QRS Duration: 94 QT Interval:  396 QTC Calculation: 443 R Axis:   7 Text Interpretation:  Sinus rhythm Prolonged PR interval Abnormal R-wave progression, early transition Baseline wander in lead(s) V3 V4 Confirmed by Kennis CarinaBero, Yomaris Palecek [phone]([id number]) on 05/21/2019 6:38:17 PM      Labs Reviewed  CBC - Abnormal; Notable for the following components:      Result Value   RBC 4.10 (*)    All other components within normal limits  COMPREHENSIVE METABOLIC PANEL - Abnormal; Notable for the following components:   CO2 21 (*)    Glucose, Bld 171 (*)    Creatinine, Ser 1.26 (*)    GFR calc non Af Amer 53 (*)    All other components within normal limits  SARS CORONAVIRUS 2 (HOSPITAL ORDER, PERFORMED IN [facility])  PROTIME-INR  APTT  DIFFERENTIAL  HEMOGLOBIN A1C  LIPID PANEL    CT HEAD WO CONTRAST  Final Result      Medications   stroke: mapping our early stages of recovery book (has no administration in time range)  acetaminophen (TYLENOL) tablet 650 mg (has no administration in time range)    Or  acetaminophen (TYLENOL) solution 650 mg (has no administration in time range)    Or  acetaminophen (TYLENOL) suppository 650 mg (has no administration in time range)  senna-docusate (Senokot-S) tablet 1 tablet (has no administration in time range)  pantoprazole (PROTONIX) injection 40 mg (has no administration in time range)  labetalol (NORMODYNE) injection 20 mg (has no administration in time range)    And  clevidipine (CLEVIPREX) infusion 0.5 mg/mL (4 mg/hr Intravenous Restarted 05/21/19 1837)  sodium chloride flush (NS) 0.9 % injection 3 mL (3 mLs Intravenous Given 05/21/19 1824)  labetalol (NORMODYNE) injection 10 mg (10 mg Intravenous Given 05/21/19 1822)     Procedures Critical  Care Critical Care Documentation Critical care time provided by me (excluding procedures): 31 minutes  Condition necessitating critical care: Acute hemorrhagic stroke  Components of critical care management: reviewing of prior records, laboratory and imaging interpretation, frequent re-examination and reassessment of vital signs, administration of IV clevidipine, discussion with consulting services    ED Course and Medical Decision Making  I have reviewed the triage vital signs and the nursing notes.  Pertinent labs & imaging results that were available during  my care of the patient were reviewed by me and considered in my medical decision making (see below for details).  Hemorrhagic stroke in this 84 year old male presenting with vision loss.  Otherwise without neurological deficits.  Hypertensive, providing with IV calcium channel blocker.  To be admitted to neuro ICU.  Elmer SowMichael M. Pilar PlateBero, MD [facility] [email or web address]  Final Clinical Impressions(s) / ED Diagnoses     ICD-10-CM   1. Hemorrhagic stroke (HCC)  I61.9   2. Visual loss  H54.7     ED Discharge Orders    None         Sabas SousBero, Paublo Warshawsky M, MD 05/21/19 Paulo [id number]

## 2019-05-22 ENCOUNTER — Other Ambulatory Visit (HOSPITAL_COMMUNITY): Payer: Medicare Other

## 2019-05-22 ENCOUNTER — Inpatient Hospital Stay (HOSPITAL_COMMUNITY): Payer: Medicare Other

## 2019-05-22 ENCOUNTER — Encounter (HOSPITAL_COMMUNITY): Payer: Self-pay | Admitting: Radiology

## 2019-05-22 DIAGNOSIS — I611 Nontraumatic intracerebral hemorrhage in hemisphere, cortical: Principal | ICD-10-CM

## 2019-05-22 LAB — SARS CORONAVIRUS 2 BY RT PCR (HOSPITAL ORDER, PERFORMED IN ~~LOC~~ HOSPITAL LAB): SARS Coronavirus 2: NEGATIVE

## 2019-05-22 MED ORDER — LABETALOL HCL 5 MG/ML IV SOLN: 20.0000 mg | INTRAVENOUS | Status: DC | PRN

## 2019-05-22 MED ORDER — METOPROLOL TARTRATE 25 MG PO TABS
12.5000 mg | ORAL_TABLET | Freq: Two times a day (BID) | ORAL | Status: DC
  Filled 2019-05-22: qty 1

## 2019-05-22 MED ORDER — ISOSORBIDE MONONITRATE ER 30 MG PO TB24
30.0000 mg | ORAL_TABLET | Freq: Every day | ORAL | Status: DC
  Administered 2019-05-22 – 2019-05-23 (×2): 30 mg via ORAL
  Filled 2019-05-22 (×2): qty 1

## 2019-05-22 MED ORDER — METOPROLOL TARTRATE 25 MG PO TABS
25.0000 mg | ORAL_TABLET | Freq: Two times a day (BID) | ORAL | Status: DC
  Administered 2019-05-22 – 2019-05-23 (×3): 25 mg via ORAL
  Filled 2019-05-22 (×2): qty 1

## 2019-05-22 MED ORDER — PROSIGHT PO TABS
1.0000 | ORAL_TABLET | Freq: Every day | ORAL | Status: DC
  Administered 2019-05-22 – 2019-05-23 (×2): 1 via ORAL
  Filled 2019-05-22 (×2): qty 1

## 2019-05-22 MED ORDER — PRESERVISION AREDS PO CAPS: ORAL_CAPSULE | Freq: Two times a day (BID) | ORAL | Status: DC

## 2019-05-22 MED ORDER — IOHEXOL 350 MG/ML SOLN
50.0000 mL | Freq: Once | INTRAVENOUS | Status: AC | PRN
  Administered 2019-05-22: 50 mL via INTRAVENOUS

## 2019-05-22 MED ORDER — VITAMIN D 25 MCG (1000 UNIT) PO TABS
1000.0000 [IU] | ORAL_TABLET | Freq: Two times a day (BID) | ORAL | Status: DC
  Administered 2019-05-22 – 2019-05-23 (×3): 1000 [IU] via ORAL
  Filled 2019-05-22 (×3): qty 1

## 2019-05-22 MED ORDER — LOSARTAN POTASSIUM 50 MG PO TABS
100.0000 mg | ORAL_TABLET | Freq: Every day | ORAL | Status: DC
  Administered 2019-05-22 – 2019-05-23 (×2): 100 mg via ORAL
  Filled 2019-05-22 (×2): qty 2

## 2019-05-22 NOTE — Progress Notes (Signed)
Patient to 4N17 at 1926, A/Ox4 on arrival with NIH as documented. Belongings at bedside are: one shirt, one pair of shorts, one pair of underwear, one pair of shoes, one chapstick, one nail clipper, and $1.25 in loose change. Belongings verified with Apolonio Schneiders, RN.  Candy Sledge, RN

## 2019-05-22 NOTE — Progress Notes (Signed)
STROKE TEAM PROGRESS NOTE   HISTORY OF PRESENT ILLNESS (per record) [name] Marks is a 84 y.o. male PMH of coronary artery disease, HTN, HLD, h/o SDH in past-listed in the chart but both wife and patient deny that history-, now presenting with sudden onset right sided visual field loss. He went to bed sometime last night and woke up this morning at 6 AM with inability to see table and bradycardia on the right side.  He went to his eye doctor for evaluation and was sent in for stroke evaluation to the ER. He reports that while on the way to the doctor's office he was unable to see the traffic lights on the right. He does not have any headache.  Denies any tingling numbness weakness. He can not tell me why a diagnosis of subdural hematoma is on his chart-he denies any prior bleeds.  LKW: 2130 hrs 05/20/2019 tpa given?: no, ICH Premorbid modified Rankin scale (mRS): 0 ICH score 1   INTERVAL HISTORY I have personally reviewed history of presenting illness in detail with the patient.  He denies any headache this morning.  He still has persistent right-sided peripheral vision difficulties.  His blood pressure has been adequately controlled.  MRI scan of the brain last night shows stable left occipital parenchymal hemorrhage without significant midline shift mass-effect or intraventricular extension.    OBJECTIVE Vitals:   05/22/19 0400 05/22/19 0500 05/22/19 0600 05/22/19 0700  BP: 129/71 125/64 127/72 112/63  Pulse: 63 (!) 57 64 (!) 54  Resp: 19 13 14 13   Temp: 98.9 F (37.2 C)     TempSrc: Oral     SpO2: 94% 96% 96% 96%  Weight:      Height:        CBC:  Recent Labs  Lab 05/21/19 1627  WBC 10.3  NEUTROABS 7.4  HGB 13.5  HCT 39.9  MCV 97.3  PLT 397    Basic Metabolic Panel:  Recent Labs  Lab 05/21/19 1627  NA 139  K 3.6  CL 107  CO2 21*  GLUCOSE 171*  BUN 21  CREATININE 1.26*  CALCIUM 9.4    Lipid Panel:     Component Value Date/Time   CHOL 123 05/21/2019  2045   TRIG 72 05/21/2019 2045   HDL 52 05/21/2019 2045   CHOLHDL 2.4 05/21/2019 2045   VLDL 14 05/21/2019 2045   LDLCALC 57 05/21/2019 2045   HgbA1c:  Lab Results  Component Value Date   HGBA1C 5.8 (H) 05/21/2019   Urine Drug Screen: No results found for: LABOPIA, COCAINSCRNUR, LABBENZ, AMPHETMU, THCU, LABBARB  Alcohol Level No results found for: ETH  IMAGING  Ct Head Wo Contrast 05/21/2019 IMPRESSION:  1. Acute left occipital lobe hemorrhage with small amount of adjacent subarachnoid and subdural hemorrhage. Mild edema without midline shift or other significant regional mass effect.  2. Moderate chronic small vessel ischemic disease.     Mr [name] Marks Wo Contrast 05/21/2019 IMPRESSION:  1. Unchanged size of intraparenchymal hematoma in the left occipital lobe.  2. No abnormal contrast enhancement.  3. No midline shift or other mass effect.     Transthoracic Echocardiogram  00/00/2020 Pending No results found for this or any previous visit (from the past 43800 hour(s)).    ECG - SR rate 75 BPM. (See cardiology reading for complete details)    PHYSICAL EXAM Blood pressure 112/63, pulse (!) 54, temperature 98.9 F (37.2 C), temperature source Oral, resp. rate 13, height 5\' 10"  (1.778 m),  weight 97.4 kg, SpO2 96 %. Pleasant elderly obese Caucasian male not in distress. . Afebrile. Head is nontraumatic. Neck is supple without bruit.    Cardiac exam no murmur or gallop. Lungs are clear to auscultation. Distal pulses are well felt. Neurological Exam ;  Awake  Alert oriented x 3. Normal speech and language.eye movements full without nystagmus.fundi were not visualized.  Dense right homonymous hemianopsia.  L. Hearing is normal. Palatal movements are normal. Face symmetric. Tongue midline. Normal strength, tone, reflexes and coordination. Normal sensation. Gait deferred.  NIHSS 2    ASSESSMENT/PLAN [name] is a 84 y.o. male with history of CAD (stents - Dr  Excell Seltzerooper), COPD, ASPVD, HTN, HLD, h/o SDH in past-listed in the chart but both wife and patient deny that history, now presenting with sudden onset right sided visual field loss.  He did not receive IV t-PA due to ICH.  Acute left occipital lobe hemorrhage with small amount of adjacent subarachnoid and subdural hemorrhage.  Resultant right homonymous hemianopsia  CT head - Acute left occipital lobe hemorrhage with small amount of adjacent subarachnoid and subdural hemorrhage. Mild edema without midline shift or other significant regional mass effect. Moderate chronic small vessel ischemic disease.   MRI head -  Unchanged size of intraparenchymal hematoma in the left occipital lobe  MRA head - not ordered  CTA H&N - not ordered  CT Perfusion - not indicated  Carotid Doppler - not indicated  2D Echo - pending  Sars Corona Virus 2 - negative  LDL - 57  HgbA1c - 5.8  UDS - not ordered  VTE prophylaxis - SCDs Diet  Diet Order            Diet Heart Room service appropriate? Yes; Fluid consistency: Thin  Diet effective now              aspirin 81 mg daily prior to admission, now on No antithrombotic  Ongoing aggressive stroke risk factor management  Therapy recommendations:  pending  Disposition:  Pending  Hypertension  Home BP meds: metoprolol 25 mg Bid ; Cozaar 100 mg daily ; (Imdur 30 mg daily)  Current BP meds: Cleviprex  Stable . SBP goal < 160 mm Hg . Long-term BP goal normotensive  Hyperlipidemia  Home Lipid lowering medication: Zocor 40 mg daily  LDL 57, goal < 70  Current lipid lowering medication: None due to ICH  Continue statin at discharge   Other Stroke Risk Factors  Advanced age  Former cigarette smoker - quit > 40 years ago  Obesity, Body mass index is 30.81 kg/m., recommend weight loss, diet and exercise as appropriate   Hx stroke/TIA - pt denies  Coronary artery disease   Other Active Problems  Creatinine - 1.26  Mild  bradycardia   PLAN  Start resuming home BP meds (resume metoprolol for hx of CAD low dose due to occassional bradycardia))  Mobilize out of bed.  Therapy consults.  Check echo and CT angiogram of brain and neck.  Keep systolic blood pressure below [id number] to 24 hours and then below 160.   Hospital day # 1  I have personally obtained history,examined this patient, reviewed notes, independently viewed imaging studies, participated in medical decision making and plan of care.ROS completed by me personally and pertinent positives fully documented  I have made any additions or clarifications directly to the above note. This patient is critically ill and at significant risk of neurological worsening, death and care requires constant monitoring  of vital signs, hemodynamics,respiratory and cardiac monitoring, extensive review of multiple databases, frequent neurological assessment, discussion with family, other specialists and medical decision making of high complexity.I have made any additions or clarifications directly to the above note.This critical care time does not reflect procedure time, or teaching time or supervisory time of PA/NP/Med Resident etc but could involve care discussion time.  I spent 30 minutes of neurocritical care time  in the care of  this patient.      [name] HeadyPramod Sethi, MD Medical Director [facility] Pager: [phone] 05/22/2019 1:30 PM   To contact Stroke Continuity provider, please refer to [email or web address]. After hours, contact General Neurology

## 2019-05-22 NOTE — Progress Notes (Signed)
PT Cancellation Note  Patient Details Name: [name] Marks MRN: [id number] DOB: 1935-07-20   Cancelled Treatment:    Reason Eval/Treat Not Completed: Active bedrest order   Ellamae Sia, PT, DPT Acute Rehabilitation Services Pager [phone] Office [phone]    Willy Eddy 05/22/2019, 8:26 AM

## 2019-05-22 NOTE — Evaluation (Signed)
Speech Language Pathology Evaluation Patient Details Name: REIS GOGA MRN: [id number] DOB: 1935-01-14 Today's Date: 05/22/2019 Time: 1017-5102 SLP Time Calculation (min) (ACUTE ONLY): 27 min  Problem List:  Patient Active Problem List   Diagnosis Date Noted  . ICH (intracerebral hemorrhage) ([place]) 05/21/2019  . Impingement syndrome of left shoulder 10/17/2016  . Acute pain of left shoulder 09/19/2016  . Preop testing 12/06/2011  . CHEST PAIN UNSPECIFIED 03/13/2010  . HEARTBURN 08/18/2009  . Hypertension 04/19/2009  . ANGINA, CHRONIC 04/19/2009  . Dyslipidemia 11/19/2007  . CORONARY ARTERY DISEASE, S/P PTCA 11/19/2007  . NEOPLASM, MALIGNANT, PROSTATE, HX OF 11/19/2007  . NEPHROLITHIASIS, HX OF 11/19/2007   Past Medical History:  Past Medical History:  Diagnosis Date  . Cerebrovascular disease   . COPD (chronic obstructive pulmonary disease) ([place])   . DJD (degenerative joint disease)   . Hyperlipidemia   . Hypertension   . Peripheral vascular disease ([place])   . Polyposis of colon   . Subdural hematoma ([facility])    history of    Past Surgical History:  Past Surgical History:  Procedure Laterality Date  . CARDIAC CATHETERIZATION  03/21/2010   Patent LAD (coronary artery) stents - No significant obstructive disease in the left circumflex -- Patent RCA with moderately severe ostial stenosis of the posterior descending (coronary) artery branch.  . CARDIAC CATHETERIZATION  08/01/2009   . LEFT HEART CATHETERIZATION WITH CORONARY ANGIOGRAM N/A 11/08/2013   Procedure: LEFT HEART CATHETERIZATION WITH CORONARY ANGIOGRAM;  Surgeon: Blane Ohara, MD;  [facility] LAB;  Service: Cardiovascular;  Laterality: N/A;   HPI:  CAROLE DEERE is a 84 y.o. male PMH of coronary artery disease, HTN, HLD, h/o SDH in past-listed in the chart but both wife and patient deny that history-, now presenting with sudden onset right sided visual field loss.   Assessment / Plan /  Recommendation Clinical Impression  Cognitive/linguistic and motor speech evaluation were completed.  Cranial nerve exam was completed and unremarkable.  Lingual, labial, jaw and facial range of motion and strength were adequate.  Sensation was intact.  Speech was clear and easy to understand. No discernible dysarthria or apraxia was noted.  He achieved a score of 24/30 on the Mini Mental State Exam suggesting very mild deficits.  He was fully oriented to person, place and situation.  He was mostly oriented to time knowing the year, month and day of week just not the date.  Attention to task seem good and he had good immediate recall of three novel words.  Language skills appeared to be intact.  Mild deficits were noted for delayed recall of 3 novel words.  He was only able to recall 2/3 words independently.  However, use of semantic cue facilitated recall of third novel word.  He was able to provide logical solutions to simple problems and was aware of his deficits and the possible issues the visual changes may lead to (ie driving with visual changes).  He struggled slightly on the clock drawing task.  He was able to draw the circle with the numbers on the clock face but he was not able to independently place the hands on the clock at the requested time.  This could lend itself to possible issues with executive functioning.  Recommend ST follow up at patient's discretion if he is having issues completing normally easy tasks at home.  He was encouraged to follow up with his primary care MD if he noticed issues.  ST will not follow  during acute stay.  Please reconsult for further needs.  Thank you.     SLP Assessment  SLP Recommendation/Assessment: Patient does not need any further Speech Lanaguage Pathology Services SLP Visit Diagnosis: Frontal lobe and executive function deficit    Follow Up Recommendations  Other - ST follow up discretion of patient if he is having issues completing normally easy tasks at  home.            SLP Evaluation Cognition  Overall Cognitive Status: Within Functional Limits for tasks assessed Arousal/Alertness: Awake/alert Orientation Level: Oriented X4 Attention: Focused Focused Attention: Appears intact Memory: Appears intact Awareness: Appears intact Problem Solving: Appears intact Executive Function: Reasoning;Organizing Reasoning: Appears intact Organizing: Impaired Organizing Impairment: Functional basic Safety/Judgment: Appears intact       Comprehension  Auditory Comprehension Overall Auditory Comprehension: Appears within functional limits for tasks assessed Yes/No Questions: Within Functional Limits Commands: Within Functional Limits Conversation: Complex Reading Comprehension Reading Status: Within funtional limits    Expression Expression Primary Mode of Expression: Verbal Verbal Expression Overall Verbal Expression: Appears within functional limits for tasks assessed Initiation: No impairment Automatic Speech: Name;Social Response Level of Generative/Spontaneous Verbalization: Conversation Repetition: No impairment Naming: No impairment Pragmatics: No impairment Non-Verbal Means of Communication: Not applicable Written Expression Dominant Hand: Right Written Expression: Within Functional Limits   Oral / Motor  Oral Motor/Sensory Function Overall Oral Motor/Sensory Function: Within functional limits Motor Speech Overall Motor Speech: Appears within functional limits for tasks assessed Respiration: Within functional limits Phonation: Normal Resonance: Within functional limits Articulation: Within functional limitis Intelligibility: Intelligible Motor Planning: Witnin functional limits Motor Speech Errors: Not applicable   GO                   Dimas AguasMelissa Ledarrius Beauchaine, MA, CCC-SLP Acute Rehab SLP [phone]  Fleet ContrasMelissa N Lavanna Rog 05/22/2019, 8:47 AM

## 2019-05-22 NOTE — Evaluation (Signed)
Physical Therapy Evaluation Patient Details Name: [name] Marks MRN: [id number] DOB: 1935-06-18 Today's Date: 05/22/2019   History of Present Illness  Pt is a 84 y.o. F with significant PMH of coronary artery disease, HTN, h/o SDH in past who presents with right visual loss. MRI showing unchanged size of intraparenchymal hematoma in the left occipital lobe.  Clinical Impression  Pt admitted with above. On PT evaluation, pt presents with right visual field deficit. Ambulating block around unit without physical difficulty, able to locate room numbers on right side with increased time. Education provided to pt and pt daughter regarding driving restrictions, safety precautions, and BEFAST stroke symptoms. Will continue to assess, anticipate no PT follow up needed.    Follow Up Recommendations No PT follow up;Supervision - Intermittent    Equipment Recommendations  None recommended by PT    Recommendations for Other Services       Precautions / Restrictions Precautions Precautions: Other (comment) Precaution Comments: R visual field deficit Restrictions Weight Bearing Restrictions: No      Mobility  Bed Mobility Overal bed mobility: Independent                Transfers Overall transfer level: Independent Equipment used: None                Ambulation/Gait Ambulation/Gait assistance: Modified independent (Device/Increase time) Gait Distance (Feet): 300 Feet Assistive device: None Gait Pattern/deviations: WFL(Within Functional Limits) Gait velocity: decreased   General Gait Details: No gross instability, able to perform varying gait speeds without overt LOB. able to way find with rooms on RIGHT side with increased time, decreased ability to dual task  Stairs            Wheelchair Mobility    Modified Rankin (Stroke Patients Only) Modified Rankin (Stroke Patients Only) Pre-Morbid Rankin Score: No symptoms Modified Rankin: Moderate disability      Balance Overall balance assessment: No apparent balance deficits (not formally assessed)                                           Pertinent Vitals/Pain Pain Assessment: No/denies pain    Home Living Family/patient expects to be discharged to:: Private residence Living Arrangements: Spouse/significant other Available Help at Discharge: Family Type of Home: House Home Access: Stairs to enter   Technical brewer of Steps: 2 Home Layout: One level        Prior Function Level of Independence: Independent         Comments: Retired, 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     Hand Dominance   Dominant Hand: Right    Extremity/Trunk Assessment   Upper Extremity Assessment Upper Extremity Assessment: Overall WFL for tasks assessed    Lower Extremity Assessment Lower Extremity Assessment: Overall WFL for tasks assessed    Cervical / Trunk Assessment Cervical / Trunk Assessment: Normal  Communication   Communication: No difficulties  Cognition Arousal/Alertness: Awake/alert Behavior During Therapy: WFL for tasks assessed/performed Overall Cognitive Status: Within Functional Limits for tasks assessed                                        General Comments      Exercises     Assessment/Plan    PT Assessment Patient needs continued PT services  PT  Problem List Decreased mobility       PT Treatment Interventions Gait training;Stair training;Functional mobility training;Therapeutic activities;Therapeutic exercise;Balance training;Patient/family education    PT Goals (Current goals can be found in the Care Plan section)  Acute Rehab PT Goals Patient Stated Goal: "Get back to gardening with my wife." PT Goal Formulation: With patient/family Time For Goal Achievement: 06/05/19 Potential to Achieve Goals: Good    Frequency Min 4X/week   Barriers to discharge        Co-evaluation               AM-PAC PT "6  Clicks" Mobility  Outcome Measure Help needed turning from your back to your side while in a flat bed without using bedrails?: None Help needed moving from lying on your back to sitting on the side of a flat bed without using bedrails?: None Help needed moving to and from a bed to a chair (including a wheelchair)?: None Help needed standing up from a chair using your arms (e.g., wheelchair or bedside chair)?: None Help needed to walk in hospital room?: None Help needed climbing 3-5 steps with a railing? : None 6 Click Score: 24    End of Session   Activity Tolerance: Patient tolerated treatment well Patient left: in chair;with call bell/phone within reach;with family/visitor present Nurse Communication: Mobility status PT Visit Diagnosis: Other symptoms and signs involving the nervous system (M84.132(R29.898)    Time: 4401-02721202-1231 PT Time Calculation (min) (ACUTE ONLY): 29 min   Charges:   PT Evaluation $PT Eval Moderate Complexity: 1 Mod PT Treatments $Therapeutic Activity: 8-22 mins        Laurina Bustlearoline Jackelynn Hosie, PT, DPT Acute Rehabilitation Services Pager [phone] Office [phone]   Vanetta MuldersCarloine H Camarie Mctigue 05/22/2019, 2:30 PM

## 2019-05-23 ENCOUNTER — Inpatient Hospital Stay (HOSPITAL_COMMUNITY): Payer: Medicare Other

## 2019-05-23 DIAGNOSIS — I340 Nonrheumatic mitral (valve) insufficiency: Secondary | ICD-10-CM

## 2019-05-23 LAB — ECHOCARDIOGRAM COMPLETE
Height: 70 in
Weight: 3435.65 oz

## 2019-05-23 MED ORDER — PANTOPRAZOLE SODIUM 40 MG PO TBEC: 40.0000 mg | DELAYED_RELEASE_TABLET | Freq: Every day | ORAL | Status: DC

## 2019-05-23 MED ORDER — ACETAMINOPHEN 325 MG PO TABS: 650.0000 mg | ORAL_TABLET | Freq: Four times a day (QID) | ORAL | Status: DC | PRN

## 2019-05-23 NOTE — Discharge Summary (Addendum)
Patient ID: [name] PurserDavid S Marks    l   MRN: [id number]      DOB: 02/10/1935  Date of Admission: 05/21/2019 Date of Discharge: 05/23/2019  Attending Physician:  Micki RileySethi, Pramod S, MD, Stroke MD Consultant(s):    None  Patient's PCP:  Elizabeth PalauAnderson, Teresa, FNP  DISCHARGE DIAGNOSIS: Acute left occipital lobe hemorrhage with small amount of adjacent subarachnoid and subdural hemorrhage- etiology indeterminate hypertensive versus amyloid angiopathy versus hemorrhagic infarct Active Problems:   ICH (intracerebral hemorrhage) (HCC)   Past Medical History:  Diagnosis Date  . Cerebrovascular disease   . COPD (chronic obstructive pulmonary disease) ([facility])   . DJD (degenerative joint disease)   . Hyperlipidemia   . Hypertension   . Peripheral vascular disease ([facility])   . Polyposis of colon   . Subdural hematoma ([facility])    history of    Past Surgical History:  Procedure Laterality Date  . CARDIAC CATHETERIZATION  03/21/2010   Patent LAD (coronary artery) stents - No significant obstructive disease in the left circumflex -- Patent RCA with moderately severe ostial stenosis of the posterior descending (coronary) artery branch.  . CARDIAC CATHETERIZATION  08/01/2009   . LEFT HEART CATHETERIZATION WITH CORONARY ANGIOGRAM N/A 11/08/2013   Procedure: LEFT HEART CATHETERIZATION WITH CORONARY ANGIOGRAM;  Surgeon: Micheline ChapmanMichael D Cooper, MD;  [facility];  Service: Cardiovascular;  Laterality: N/A;    Family History Family History  Problem Relation Age of Onset  . Heart attack Father   . Heart attack Sister   . Breast cancer Sister   . Breast cancer Sister   . Heart disease Brother     Social History  reports that he quit smoking about 47 years ago. He quit smokeless tobacco use about 47 years ago.  His smokeless tobacco use included chew. He reports that he does not drink alcohol or use drugs.  Allergies as of 05/23/2019   No Known Allergies     Medication List    STOP taking these medications    aspirin 81 MG tablet     TAKE these medications   acetaminophen 325 MG tablet Commonly known as: TYLENOL Take 2 tablets (650 mg total) by mouth every 6 (six) hours as needed for headache.   cholecalciferol 1000 units tablet Commonly known as: VITAMIN D Take 1,000 Units by mouth 2 (two) times daily.   isosorbide mononitrate 30 MG 24 hr tablet Commonly known as: IMDUR Take 1 tablet (30 mg total) by mouth daily.   losartan 100 MG tablet Commonly known as: COZAAR Take 100 mg by mouth daily.   metoprolol tartrate 25 MG tablet Commonly known as: LOPRESSOR Take 1 tablet (25 mg total) by mouth 2 (two) times daily.   PRESERVISION AREDS PO Take 1 tablet by mouth 2 (two) times daily.   simvastatin 40 MG tablet Commonly known as: ZOCOR TAKE 1 TABLET BY MOUTH EVERY NIGHT AT BEDTIME       HOME MEDICATIONS PRIOR TO ADMISSION Medications Prior to Admission  Medication Sig Dispense Refill  . aspirin 81 MG tablet Take 81 mg by mouth daily.     . cholecalciferol (VITAMIN D) 1000 UNITS tablet Take 1,000 Units by mouth 2 (two) times daily.     . isosorbide mononitrate (IMDUR) 30 MG 24 hr tablet Take 1 tablet (30 mg total) by mouth daily. 90 tablet 3  . losartan (COZAAR) 100 MG tablet Take 100 mg by mouth daily.     . metoprolol tartrate (LOPRESSOR) 25 MG tablet Take 1  tablet (25 mg total) by mouth 2 (two) times daily. 180 tablet 3  . Multiple Vitamins-Minerals (PRESERVISION AREDS PO) Take 1 tablet by mouth 2 (two) times daily.     . simvastatin (ZOCOR) 40 MG tablet TAKE 1 TABLET BY MOUTH EVERY NIGHT AT BEDTIME (Patient taking differently: Take 40 mg by mouth at bedtime. ) 90 tablet 3     HOSPITAL MEDICATIONS . Chlorhexidine Gluconate Cloth  6 each Topical Daily  . cholecalciferol  1,000 Units Oral BID  . isosorbide mononitrate  30 mg Oral Daily  . labetalol  20 mg Intravenous Once  . losartan  100 mg Oral Daily  . metoprolol tartrate  25 mg Oral BID  . multivitamin  1 tablet Oral  Daily  . pantoprazole (PROTONIX) IV  40 mg Intravenous QHS  . senna-docusate  1 tablet Oral BID    LABORATORY STUDIES CBC    Component Value Date/Time   WBC 10.3 05/21/2019 1627   RBC 4.10 (L) 05/21/2019 1627   HGB 13.5 05/21/2019 1627   HCT 39.9 05/21/2019 1627   PLT 236 05/21/2019 1627   MCV 97.3 05/21/2019 1627   MCH 32.9 05/21/2019 1627   MCHC 33.8 05/21/2019 1627   RDW 13.6 05/21/2019 1627   LYMPHSABS 1.8 05/21/2019 1627   MONOABS 0.7 05/21/2019 1627   EOSABS 0.4 05/21/2019 1627   BASOSABS 0.1 05/21/2019 1627   CMP    Component Value Date/Time   NA 139 05/21/2019 1627   K 3.6 05/21/2019 1627   CL 107 05/21/2019 1627   CO2 21 (L) 05/21/2019 1627   GLUCOSE 171 (H) 05/21/2019 1627   BUN 21 05/21/2019 1627   CREATININE 1.26 (H) 05/21/2019 1627   CALCIUM 9.4 05/21/2019 1627   PROT 7.1 05/21/2019 1627   ALBUMIN 4.0 05/21/2019 1627   AST 29 05/21/2019 1627   ALT 23 05/21/2019 1627   ALKPHOS 63 05/21/2019 1627   BILITOT 0.8 05/21/2019 1627   GFRNONAA 53 (L) 05/21/2019 1627   GFRAA >60 05/21/2019 1627   COAGS Lab Results  Component Value Date   INR 1.1 05/21/2019   INR 1.0 11/05/2013   INR 0.99 03/21/2010   Lipid Panel    Component Value Date/Time   CHOL 123 05/21/2019 2045   TRIG 72 05/21/2019 2045   HDL 52 05/21/2019 2045   CHOLHDL 2.4 05/21/2019 2045   VLDL 14 05/21/2019 2045   LDLCALC 57 05/21/2019 2045   HgbA1C  Lab Results  Component Value Date   HGBA1C 5.8 (H) 05/21/2019   Urinalysis No results found for: COLORURINE, APPEARANCEUR, LABSPEC, PHURINE, GLUCOSEU, HGBUR, BILIRUBINUR, KETONESUR, PROTEINUR, UROBILINOGEN, NITRITE, LEUKOCYTESUR Urine Drug Screen No results found for: LABOPIA, COCAINSCRNUR, LABBENZ, AMPHETMU, THCU, LABBARB  Alcohol Level No results found for: [facility]   SIGNIFICANT DIAGNOSTIC STUDIES  Ct Head Wo Contrast 05/21/2019 IMPRESSION:  1. Acute left occipital lobe hemorrhage with small amount of adjacent subarachnoid and  subdural hemorrhage. Mild edema without midline shift or other significant regional mass effect.  2. Moderate chronic small vessel ischemic disease.   [name] Laqueta JeanBrain W Wo Contrast 05/21/2019 IMPRESSION:  1. Unchanged size of intraparenchymal hematoma in the left occipital lobe.  2. No abnormal contrast enhancement.  3. No midline shift or other mass effect.   CTA Head 05/22/2019 IMPRESSION: Unremarkable CTA for age. No vascular lesion to explain the patient's occipital hematoma (which is size stable from yesterday).   Transthoracic Echocardiogram  05/23/2019 IMPRESSIONS  1. The left ventricle has normal systolic function with an ejection  fraction of 60-65%. The cavity size was normal. Left ventricular diastolic Doppler parameters are consistent with impaired relaxation.  2. The right ventricle has normal systolic function. The cavity was normal.  3. The tricuspid valve is grossly normal.  4. The aortic valve is tricuspid. Mild thickening of the aortic valve. No stenosis of the aortic valve.  5. The aorta is normal unless otherwise noted.  6. Normal LV systolic function; mild diastolic dysfunction.   ECG - SR rate 75 BPM. (See cardiology reading for complete details)   HISTORY OF PRESENT ILLNESS (from H&P 05/21/2019 Dr Wilford CornerArora) [name] Bigavid S Williamsis a 84 y.o.malePMH ofcoronary artery disease,HTN, HLD, h/o SDH in past-listed in the chart but both wife and patient deny that history-, now presenting with sudden onset right sidedvisual field loss. He went to bed sometime last night and woke up this morning at 6 AM with inability to see table and bradycardia on the right side. He went to his eye doctor for evaluation and was sent in for stroke evaluation to the ER. He reports that while on the way to the doctor's office he was unable to see the traffic lights on the right. He does not have any headache. Denies any tingling numbness weakness. He can not tell me why a diagnosis ofsubdural  hematoma is on his chart-he denies any prior bleeds.  FBP:1025LKW:2130 hrs 05/20/2019 tpa given?: no,ICH Premorbid modified Rankin scale (mRS):0 ICH score1  HOSPITAL COURSE [name]. [name] BrownieDavid S Voong is a 84 y.o. male with history of CAD (stents - Dr Cooper),COPD, ASPVD, HTN, HLD, h/o SDH in past-listed in the chart but both wife and patient deny that history, now presenting with sudden onset right sidedvisual field loss.  He did not receive IV t-PA due to ICH.  Acute left occipital lobe hemorrhage with small amount of adjacent subarachnoid and subdural hemorrhage.  Resultant right homonymous hemianopsia  CT head - Acute left occipital lobe hemorrhage with small amount of adjacent subarachnoid and subdural hemorrhage. Mild edema without midline shift or other significant regional mass effect. Moderate chronic small vessel ischemic disease.   MRI head -  Unchanged size of intraparenchymal hematoma in the left occipital lobe  MRA head - not ordered  CTA Head - Unremarkable CTA for age. No vascular lesion to explain the patient's occipital hematoma (which is size stable from yesterday).  CT Perfusion - not indicated  Carotid Doppler - not indicated  2D Echo - - EF 60 - 65%. (see report above)  [employer] Corona Virus 2 - negative  LDL - 57  HgbA1c - 5.8  UDS - not ordered  VTE prophylaxis - SCDs Diet      Diet Order                         Diet Heart Room service appropriate? Yes; Fluid consistency: Thin  Diet effective now                    aspirin 81 mg daily prior to admission, now on No antithrombotic  Ongoing aggressive stroke risk factor management  Therapy recommendations:  Outpatient Occupational Therapy - No PT f/u  Disposition:  Discharge to home  Hypertension  Home BP meds: metoprolol 25 mg Bid ; Cozaar 100 mg daily ; (Imdur 30 mg daily)  Current BP meds: metoprolol 25 mg Bid ; Cozaar 100 mg daily ; (Imdur 30 mg daily)  Stable (SBP 130s  - 150s today)  SBP goal <  160 mm Hg  Long-term BP goal normotensive  Hyperlipidemia  Home Lipid lowering medication: Zocor 40 mg daily  LDL 57, goal < 70  Current lipid lowering medication: None due to [place]  Continue statin at discharge   Other Stroke Risk Factors  Advanced age  Former cigarette smoker - quit > 40 years ago  Obesity, Body mass index is 30.81 kg/m., recommend weight loss, diet and exercise as appropriate   Hx stroke/TIA - pt denies  Coronary artery disease   Other Active Problems  Creatinine - 1.26  Bradycardia - (low 50s today) - back on home metoprolol dose 25 mg Bid    DISCHARGE EXAM Vitals:   05/23/19 0406 05/23/19 0500 05/23/19 0600 05/23/19 0700  BP:  136/67 134/68 (!) 153/84  Pulse:  (!) 52 (!) 52   Resp:  13 12 16   Temp: 97.7 F (36.5 C)     TempSrc: Oral     SpO2:  98% 97% 98%  Weight:      Height:       Pleasant elderly obese Caucasian male not in distress. . Afebrile. Head is nontraumatic. Neck is supple without bruit.    Cardiac exam no murmur or gallop. Lungs are clear to auscultation. Distal pulses are well felt. Neurological Exam ;  Awake  Alert oriented x 3. Normal speech and language.eye movements full without nystagmus.fundi were not visualized.  Dense right homonymous hemianopsia.  L. Hearing is normal. Palatal movements are normal. Face symmetric. Tongue midline. Normal strength, tone, reflexes and coordination. Normal sensation. Gait deferred. NIHSS 2  Discharge Diet    Diet Order            Diet Heart Room service appropriate? Yes; Fluid consistency: Thin  Diet effective now             liquids  DISCHARGE PLAN  Disposition:  Discharge to home. No driving until cleared by MD.  No antithrombotic for secondary stroke prevention secondary to [place].  Ongoing risk factor control by Primary Care Physician at time of discharge  Follow-up Vicenta Aly, FNP in 2 weeks.  Follow-up in [facility] in 4 weeks, office to schedule an appointment.   Outpatient Occupational Therapy  Spoke to wife who agrees with plan  35 minutes were spent preparing discharge.  [name] Bussing PA-C [facility] Pager [phone] 05/23/2019, 11:47 AM  I have personally obtained history,examined this patient, reviewed notes, independently viewed imaging studies, participated in medical decision making and plan of care.ROS completed by me personally and pertinent positives fully documented  I have made any additions or clarifications directly to the above note. Agree with note above.    Antony Contras, MD Medical Director [facility] Pager: [phone] 05/23/2019 1:26 PM

## 2019-05-23 NOTE — TOC Transition Note (Signed)
Transition of Care [facility]) - CM/SW Discharge Note   Patient Details  Name: [name] Marks MRN: [id number] Date of Birth: [birth year]  [facility] of [facility] CM/SW Contact:  Bartholomew Crews, RN Phone Number: [phone] 05/23/2019, 12:34 PM   Clinical Narrative:    Received consult that patient needed outpatient OT. Referral placed. No other transition of care needs identified.    Final next level of care: OP Rehab Barriers to Discharge: No Barriers Identified   Patient Goals and CMS Choice        Discharge Placement                       Discharge Plan and Services                                     Social Determinants of Health (SDOH) Interventions     Readmission Risk Interventions No flowsheet data found.

## 2019-05-23 NOTE — Progress Notes (Signed)
STROKE TEAM PROGRESS NOTE       INTERVAL HISTORY Patient is doing well.  Is ambulating with physical therapist on the unit.  Blood pressure adequately controlled.  CT angiogram shows no evidence of underlying aneurysm or AVM.  He continues to have persistent visual field defect but has no other complaints.  He wants to go home.   OBJECTIVE Vitals:   05/23/19 0406 05/23/19 0500 05/23/19 0600 05/23/19 0700  BP:  136/67 134/68 (!) 153/84  Pulse:  (!) 52 (!) 52   Resp:  13 12 16   Temp: 97.7 F (36.5 C)     TempSrc: Oral     SpO2:  98% 97% 98%  Weight:      Height:        CBC:  Recent Labs  Lab 05/21/19 1627  WBC 10.3  NEUTROABS 7.4  HGB 13.5  HCT 39.9  MCV 97.3  PLT 236    Basic Metabolic Panel:  Recent Labs  Lab 05/21/19 1627  NA 139  K 3.6  CL 107  CO2 21*  GLUCOSE 171*  BUN 21  CREATININE 1.26*  CALCIUM 9.4    Lipid Panel:     Component Value Date/Time   CHOL 123 05/21/2019 2045   TRIG 72 05/21/2019 2045   HDL 52 05/21/2019 2045   CHOLHDL 2.4 05/21/2019 2045   VLDL 14 05/21/2019 2045   LDLCALC 57 05/21/2019 2045   HgbA1c:  Lab Results  Component Value Date   HGBA1C 5.8 (H) 05/21/2019   Urine Drug Screen: No results found for: LABOPIA, COCAINSCRNUR, LABBENZ, AMPHETMU, THCU, LABBARB  Alcohol Level No results found for: ETH  IMAGING  Ct Head Wo Contrast 05/21/2019 IMPRESSION:  1. Acute left occipital lobe hemorrhage with small amount of adjacent subarachnoid and subdural hemorrhage. Mild edema without midline shift or other significant regional mass effect.  2. Moderate chronic small vessel ischemic disease.    Mr Laqueta JeanBrain W Wo Contrast 05/21/2019 IMPRESSION:  1. Unchanged size of intraparenchymal hematoma in the left occipital lobe.  2. No abnormal contrast enhancement.  3. No midline shift or other mass effect.   CTA Head 05/22/2019 IMPRESSION: Unremarkable CTA for age. No vascular lesion to explain the patient's occipital hematoma (which  is size stable from yesterday).   Transthoracic Echocardiogram  00/00/2020 Pending No results found for this or any previous visit (from the past [id number] hour(s)).   ECG - SR rate 75 BPM. (See cardiology reading for complete details)    PHYSICAL EXAM Blood pressure (!) 153/84, pulse (!) 52, temperature 97.7 F (36.5 C), temperature source Oral, resp. rate 16, height 5\' 10"  (1.778 m), weight 97.4 kg, SpO2 98 %. Pleasant elderly obese Caucasian male not in distress. . Afebrile. Head is nontraumatic. Neck is supple without bruit.    Cardiac exam no murmur or gallop. Lungs are clear to auscultation. Distal pulses are well felt. Neurological Exam ;  Awake  Alert oriented x 3. Normal speech and language.eye movements full without nystagmus.fundi were not visualized.  Dense right homonymous hemianopsia.  L. Hearing is normal. Palatal movements are normal. Face symmetric. Tongue midline. Normal strength, tone, reflexes and coordination. Normal sensation. Gait deferred.  NIHSS 2    ASSESSMENT/PLAN [name] Marks is a 84 y.o. male with history of CAD (stents - Dr Excell Seltzerooper), COPD, ASPVD, HTN, HLD, h/o SDH in past-listed in the chart but both wife and patient deny that history, now presenting with sudden onset right sided visual field loss.  He did  not receive IV t-PA due to [place].  Acute left occipital lobe hemorrhage with small amount of adjacent subarachnoid and subdural hemorrhage.etiology indeterminate- HT versus amyloid  Resultant right homonymous hemianopsia  CT head - Acute left occipital lobe hemorrhage with small amount of adjacent subarachnoid and subdural hemorrhage. Mild edema without midline shift or other significant regional mass effect. Moderate chronic small vessel ischemic disease.   MRI head -  Unchanged size of intraparenchymal hematoma in the left occipital lobe  MRA head - not ordered  CTA Head - Unremarkable CTA for age. No vascular lesion to explain the patient's  occipital hematoma (which is size stable from yesterday).  CT Perfusion - not indicated  Carotid Doppler - not indicated  2D Echo -normal EF.  No cardiac source of embolism.  Sars Corona Virus 2 - negative  LDL - 57  HgbA1c - 5.8  UDS - not ordered  VTE prophylaxis - SCDs Diet  Diet Order            Diet Heart Room service appropriate? Yes; Fluid consistency: Thin  Diet effective now              aspirin 81 mg daily prior to admission, now on No antithrombotic  Ongoing aggressive stroke risk factor management  Therapy recommendations:  Pending (pt was on bedrest)  Disposition:  Pending  Hypertension  Home BP meds: metoprolol 25 mg Bid ; Cozaar 100 mg daily ; (Imdur 30 mg daily)  Current BP meds: metoprolol 25 mg Bid ; Cozaar 100 mg daily ; (Imdur 30 mg daily)  Stable (SBP 130s - 150s today) . SBP goal < 160 mm Hg . Long-term BP goal normotensive  Hyperlipidemia  Home Lipid lowering medication: Zocor 40 mg daily  LDL 57, goal < 70  Current lipid lowering medication: None due to [place]  Continue statin at discharge   Other Stroke Risk Factors  Advanced age  Former cigarette smoker - quit > 40 years ago  Obesity, Body mass index is 30.81 kg/m., recommend weight loss, diet and exercise as appropriate   Hx stroke/TIA - pt denies  Coronary artery disease   Other Active Problems  Creatinine - 1.26  Bradycardia - (low 50s today) - back on home metoprolol dose 25 mg Bid   PLAN Discharge home later today.  Outpatient therapy.  Patient advised not to drive.  Maintain aggressive blood pressure control.  Follow-up as an outpatient with stroke clinic in 6 weeks.  I spoke to the patient's wife and answered questions. Antony Contras, MD       To contact Stroke Continuity provider, please refer to [email or web address] After hours, contact General Neurology

## 2019-05-23 NOTE — Discharge Instructions (Signed)
1. Increase activity gradually as tolerated. 2.   Hemianopia  Hemianopia, also called hemiopia or hemianopsia, is partial vision loss in the right or left half of the eye. This may affect one or both eyes. It is often caused by a stroke or brain injury. This condition causes trouble seeing and can make it difficult to do everyday tasks. What are the causes? This condition may be caused by:  Stroke.  Brain or head injury.  Brain tumor.  Bleeding in the brain.  Brain surgery.  Certain medical conditions, such as multiple sclerosis. What are the signs or symptoms? This condition causes vision loss in the right or left half of the field of vision, in one or both eyes. As a result, you may:  Have problems reading or writing.  Have frequent falls or stumbles because you cannot see well.  Bump into people or things.  Knock things over frequently.  Have problems driving.  Have problems seeing or using common things.  Get surprised by people or things that seem to appear suddenly. How is this diagnosed? This condition may be diagnosed by:  A physical exam, medical history, and your symptoms.  An eye exam.  A visual field test to check your vision in each part of your eye.  Imaging tests of your brain, such as CT scan or MRI. You may be referred to an eye doctor who specializes in eye problems that are related to the brain (neuro-ophthalmologist). How is this treated? Treatment for this condition may include:  Eye exercises.  Scanning therapy. This teaches you to look side-to-side so you can see what is in your blind spot.  Vision restoration therapy (VRT). This type of therapy involves using a computer to do eye exercises to help your eyes focus.  Special devices to read or do other tasks.  Special glasses with prisms to help your vision.  Reading strategies to help you read, such as turning the text to the side or using a straight edge to follow the text. Follow  these instructions at home: Lifestyle  Make changes to your home to avoid falls and injuries. This may include: ? Removing rugs and mats from the floor. ? Removing clutter. ? Adding rails or grab bars in the bathroom.  Do not drive or use heavy machinery until your health care provider approves.  If you have trouble with basic or daily tasks such as cooking, bathing, or washing, ask for help. General instructions  Do not use any products that contain nicotine or tobacco, such as cigarettes and e-cigarettes. If you need help quitting, ask your health care provider.  Take over-the-counter and prescription medicines only as told by your health care provider.  Do exercises and therapy as directed. Use devices and glasses as told by your health care provider.  Keep all follow-up visits as told by your health care provider. This is important. Contact a health care provider if:  You lose more of your vision.  You become dizzy or you faint.  You have problems doing your eye exercises or other therapies. Get help right away if:  You have severe pain in one or both of your eyes.  You cannot see at all.  You have a seizure. Summary  Hemianopia is partial vision loss in the right or left half of the eye. This may affect one or both eyes.  This condition is often caused by a stroke or brain injury.  You may need eye exercises, therapies, special glasses, and  other treatments.  If you have trouble with basic or daily tasks such as cooking, bathing, or washing, ask for help. This information is not intended to replace advice given to you by your health care provider. Make sure you discuss any questions you have with your health care provider. Document Released: 12/30/2016 Document Revised: 09/05/2017 Document Reviewed: 12/30/2016 Elsevier Patient Education  2020 [employer] No driving until cleared by MD.

## 2019-05-23 NOTE — Evaluation (Signed)
Occupational Therapy Evaluation Patient Details Name: [name] BrownieDavid S Kiser MRN: [id number] DOB: 08/03/1935 Today's Date: 05/23/2019    History of Present Illness Pt is a 84 y.o. F with significant PMH of coronary artery disease, HTN, h/o SDH in past who presents with right visual loss. MRI showing unchanged size of intraparenchymal hematoma in the left occipital lobe.   Clinical Impression   PTA patient independent and driving. Admitted for above and limited by problem list below, including R visual field deficit (lower>upper quadrant).  Patient currently requires supervision for ADLs and mobility for safety, increased time to locate items on L side and increased time for dual cog tasks (but also HOH).  Patient educated on visual compensatory techniques, home safety and fall prevention, agreeable to no driving and assist with IADLs at this time.  Patient will benefit from continued OT services while admitted and after dc at OP neuro vision OT in order to maximize independence and use of compensatory techniques with vision. Will follow.     Follow Up Recommendations  Outpatient OT(neuro vision OT)    Equipment Recommendations  None recommended by OT    Recommendations for Other Services       Precautions / Restrictions Precautions Precautions: Other (comment) Precaution Comments: R visual field deficit Restrictions Weight Bearing Restrictions: No      Mobility Bed Mobility               General bed mobility comments: OOB upon entry   Transfers Overall transfer level: Independent               General transfer comment: sit to stand without assist, functional transfers (bathroom and shower) with supervision    Balance Overall balance assessment: No apparent balance deficits (not formally assessed)                                         ADL either performed or assessed with clinical judgement   ADL Overall ADL's : Needs assistance/impaired      Grooming: Supervision/safety;Standing   Upper Body Bathing: Supervision/ safety;Sitting   Lower Body Bathing: Supervison/ safety;Sit to/from stand   Upper Body Dressing : Supervision/safety;Sitting   Lower Body Dressing: Supervision/safety;Sit to/from stand   Toilet Transfer: Supervision/safety;Ambulation   Toileting- Clothing Manipulation and Hygiene: Supervision/safety;Sit to/from stand       Functional mobility during ADLs: Supervision/safety General ADL Comments: pt requires supervision for safety and visual field deficits on R      Vision Baseline Vision/History: Wears glasses Wears Glasses: At all times Patient Visual Report: Other (comment)(R visual loss) Vision Assessment?: Yes Eye Alignment: Within Functional Limits Ocular Range of Motion: Within Functional Limits Alignment/Gaze Preference: Within Defined Limits Visual Fields: Right visual field deficit(lower quadrant > upper quadrant)     Perception     Praxis      Pertinent Vitals/Pain Pain Assessment: No/denies pain     Hand Dominance Right   Extremity/Trunk Assessment Upper Extremity Assessment Upper Extremity Assessment: Overall WFL for tasks assessed   Lower Extremity Assessment Lower Extremity Assessment: Overall WFL for tasks assessed   Cervical / Trunk Assessment Cervical / Trunk Assessment: Normal   Communication Communication Communication: No difficulties   Cognition Arousal/Alertness: Awake/alert Behavior During Therapy: WFL for tasks assessed/performed Overall Cognitive Status: Within Functional Limits for tasks assessed  General Comments: good awareness of visual deficits    General Comments  VSS, reviewed safety, compensatory techniques for visual loss, home safety (lighting, clear pathways, and fall prevention); pt agreeable to NO driving and IADL limitations, reports spouse can assist      Exercises     Shoulder Instructions       Home Living Family/patient expects to be discharged to:: Private residence Living Arrangements: Spouse/significant other Available Help at Discharge: Family Type of Home: House Home Access: Stairs to enter Technical brewer of Steps: 2   Home Layout: One level     Bathroom Shower/Tub: Occupational psychologist: Standard     Home Equipment: Shower seat          Prior Functioning/Environment Level of Independence: Independent        Comments: Retired, 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        OT Problem List: Impaired vision/perception      OT Treatment/Interventions: Self-care/ADL training;Visual/perceptual remediation/compensation;Patient/family education;Therapeutic activities    OT Goals(Current goals can be found in the care plan section) Acute Rehab OT Goals Patient Stated Goal: "Get back to gardening with my wife." OT Goal Formulation: With patient Time For Goal Achievement: 06/06/19 Potential to Achieve Goals: Good  OT Frequency: Min 2X/week   Barriers to D/C:            Co-evaluation              AM-PAC OT "6 Clicks" Daily Activity     Outcome Measure Help from another person eating meals?: A Little Help from another person taking care of personal grooming?: A Little Help from another person toileting, which includes using toliet, bedpan, or urinal?: A Little Help from another person bathing (including washing, rinsing, drying)?: A Little Help from another person to put on and taking off regular upper body clothing?: A Little Help from another person to put on and taking off regular lower body clothing?: A Little 6 Click Score: 18   End of Session Nurse Communication: Mobility status  Activity Tolerance: Patient tolerated treatment well Patient left: in chair;with call bell/phone within reach  OT Visit Diagnosis: Low vision, both eyes (H54.2)                Time: 9937-1696 OT Time Calculation (min): 25 min Charges:   OT General Charges $OT Visit: 1 Visit OT Evaluation $OT Eval Moderate Complexity: 1 Mod OT Treatments $Self Care/Home Management : 8-22 mins  Delight Stare, OT Acute Rehabilitation Services Pager [phone] Office [phone]   Delight Stare 05/23/2019, 9:41 AM

## 2019-05-23 NOTE — Progress Notes (Signed)
  Echocardiogram 2D Echocardiogram has been performed.  [name] Marks 05/23/2019, 10:11 AM

## 2019-05-24 ENCOUNTER — Telehealth: Payer: Self-pay | Admitting: Cardiovascular Disease

## 2019-05-24 NOTE — Telephone Encounter (Signed)
New message:     Patient wife calling stating that her husbnd had a stroke on Friday, and the doctor at the hospital instructed the patient to call and get a appt as soon as possible. Patient wife said if they are not home she will take any appt to see doctor Burt Knack this week

## 2019-05-24 NOTE — Telephone Encounter (Signed)
Mr. Plotts was recently hospitalized for hemorraghic stroke and was taken off ASA.  The patient's wife called to notify Dr. Burt Knack and to make an appointment. Scheduled the patient for check-up with Richardson Dopp next Friday.  She understands she will be called prior to that time if Dr. Burt Knack has further recommendations.

## 2019-05-25 NOTE — Telephone Encounter (Signed)
thx for letting me know 

## 2019-06-03 NOTE — Progress Notes (Signed)
Cardiology Office Note:    Date:  06/04/2019   ID:  DAVONNE JARNIGAN, DOB 1935/05/04, MRN [id number]  PCP:  Vicenta Aly, [place]  Cardiologist:  Sherren Mocha, MD  Electrophysiologist:  None   Referring MD: Vicenta Aly, [place]   Chief Complaint  Patient presents with  . Hospitalization Follow-up    Admitted with intracranial hemorrhage    History of Present Illness:    [name] Marks is a 84 y.o. male with:  Coronary artery disease   S/p PCI of the LAD  Angina >> cath 2015 >> mod diff LAD dz with FFR 0.79 >> med Rx due to diffuse nature of dz  Hypertension  Hyperlipidemia  Hx of Subdural Hematoma  COPD  PAD  S/p Intracranial hemorrhage 05/2019  [name] Marks was last seen in 10/2018.  [name] Marks was admitted 8/14-8/16 with a L occipital lobe hemorrhage with resultant right homonymous hemianopsia.  His ASA was DC'd.    [name] Marks returns for follow-up.  [name] Marks is here with his daughter.  [name] Marks has residual right leg weakness as well as visual field cut.  [name] Marks is currently working with physical therapy and has a visit scheduled with his ophthalmologist.  [name] Marks has not had chest discomfort.  [name] Marks has chronic shortness of breath without significant change.  [name] Marks has not had syncope, orthopnea or significant leg swelling.  Blood pressures at home have been 120s-130s.  Prior CV studies:   The following studies were reviewed today:  Echocardiogram 05/23/2019 EF 60-65, Gr 1 DD, normal RVSF  Stress Echocardiogram 07/12/15 No exercise induced ischemic wall motion abnormalities detected.   1 mm horizontal ST depression noted in the lateral leads with   exercise. Exercise tolerance was fair and there was no chest   pain. Negative stress echo for ischemia.  Cardiac catheterization 11/08/13 LM irregs LAD prox stent patient with 40-50 at prox edge of stent, mid stent patent with 50 ISR   - FFR: 0.79 LCx patent RCA patent; PAVB ost 60-70 EF 55-65 1. Mild to moderate diffuse LAD stenosis with borderline  positive pressure wire analysis 2. Patency of the left circumflex and right coronary arteries was stable ostial stenosis of the right posterior AV segment 3. Normal LV function Recommendations: Continued medical therapy.   Past Medical History:  Diagnosis Date  . Cerebrovascular disease   . COPD (chronic obstructive pulmonary disease) ([place])   . DJD (degenerative joint disease)   . Hyperlipidemia   . Hypertension   . Peripheral vascular disease ([place])   . Polyposis of colon   . Subdural hematoma ([facility])    history of    Surgical Hx: The patient  has a past surgical history that includes Cardiac catheterization (03/21/2010); Cardiac catheterization (08/01/2009 ); and left heart catheterization with coronary angiogram (N/A, 11/08/2013).   Current Medications: Current Meds  Medication Sig  . acetaminophen (TYLENOL) 325 MG tablet Take 2 tablets (650 mg total) by mouth every 6 (six) hours as needed for headache.  . cholecalciferol (VITAMIN D) 1000 UNITS tablet Take 1,000 Units by mouth 2 (two) times daily.   . isosorbide mononitrate (IMDUR) 30 MG 24 hr tablet Take 1 tablet (30 mg total) by mouth daily.  Marland Kitchen losartan (COZAAR) 100 MG tablet Take 100 mg by mouth daily.   . metoprolol tartrate (LOPRESSOR) 25 MG tablet Take 1 tablet (25 mg total) by mouth 2 (two) times daily.  . Multiple Vitamins-Minerals (PRESERVISION AREDS PO) Take 1 tablet by mouth 2 (two) times daily.   . simvastatin (  ZOCOR) 40 MG tablet TAKE 1 TABLET BY MOUTH EVERY NIGHT AT BEDTIME (Patient taking differently: Take 40 mg by mouth at bedtime. )     Allergies:   Patient has no known allergies.   Social History   Tobacco Use  . Smoking status: Former Smoker    Quit date: 09/09/1971    Years since quitting: 47.7  . Smokeless tobacco: Former Neurosurgeon    Types: Chew    Quit date: 09/09/1971  Substance Use Topics  . Alcohol use: Never    Frequency: Never  . Drug use: Never     Family Hx: The patient's family history  includes Breast cancer in his sister and sister; Heart attack in his father and sister; Heart disease in his brother.  ROS:   Please see the history of present illness.    ROS All other systems reviewed and are negative.   EKGs/Labs/Other Test Reviewed:    EKG:  EKG is  ordered today.  The ekg ordered today demonstrates sinus bradycardia, heart rate 54, normal axis, no acute changes, QTC 407, similar to prior tracing  Recent Labs: 05/21/2019: ALT 23; BUN 21; Creatinine, Ser 1.26; Hemoglobin 13.5; Platelets 236; Potassium 3.6; Sodium 139   Recent Lipid Panel Lab Results  Component Value Date/Time   CHOL 123 05/21/2019 08:45 PM   TRIG 72 05/21/2019 08:45 PM   HDL 52 05/21/2019 08:45 PM   CHOLHDL 2.4 05/21/2019 08:45 PM   LDLCALC 57 05/21/2019 08:45 PM    Physical Exam:    VS:  BP (!) 146/72   Pulse (!) 54   Ht 5\' 10"  (1.778 m)   Wt 212 lb 1.9 oz (96.2 kg)   BMI 30.44 kg/m     Wt Readings from Last 3 Encounters:  06/04/19 212 lb 1.9 oz (96.2 kg)  05/21/19 214 lb 11.7 oz (97.4 kg)  11/02/18 226 lb 6.4 oz (102.7 kg)     Physical Exam  Constitutional: [name] Marks is oriented to person, place, and time. [name] Marks appears well-developed and well-nourished. No distress.  HENT:  Head: Normocephalic and atraumatic.  Eyes: No scleral icterus.  Neck: No JVD present. No thyromegaly present.  Cardiovascular: Normal rate, regular rhythm and normal heart sounds.  No murmur heard. Pulmonary/Chest: Effort normal and breath sounds normal. [name] Marks has no rales.  Abdominal: Soft. There is no hepatomegaly.  Musculoskeletal:        General: No edema.  Lymphadenopathy:    [name] Marks has no cervical adenopathy.  Neurological: [name] Marks is alert and oriented to person, place, and time.  Skin: Skin is warm and dry.  Psychiatric: [name] Marks has a normal mood and affect.    ASSESSMENT & PLAN:    1. Intracranial hemorrhage  Follow-up with neurology as planned.  I asked him to contact his primary care physician or our office if his  blood pressure spikes or becomes uncontrolled.  2. Coronary artery disease with angina pectoris History of prior LAD stenting.  [name] Marks had moderate disease in the LAD by cardiac catheterization in 2015.  Medical therapy was recommended secondary to the diffuse nature of disease.  [name] Marks is currently off aspirin secondary to recent intracranial bleed.  For secondary prevention, we would recommend [name] Marks resume aspirin as soon as felt safe.  [name] Marks sees Dr. Pearlean Brownie in October.  We will try to see him back shortly after that visit for close follow-up.  3. Essential hypertension The patient's blood pressure is controlled on his current regimen.  Continue current therapy.   4. Hyperlipidemia  Continue statin therapy.   Dispo:  Return in about 2 months (around 08/04/2019) for w/ Dr. Excell Seltzerooper, or Tereso NewcomerScott Maymie Brunke, PA-C, (virtual or in-person).   Medication Adjustments/Labs and Tests Ordered: Current medicines are reviewed at length with the patient today.  Concerns regarding medicines are outlined above.  Tests Ordered: Orders Placed This Encounter  Procedures  . EKG 12-Lead   Medication Changes: No orders of the defined types were placed in this encounter.   Signed, Tereso NewcomerScott Jessic Standifer, PA-C  06/04/2019 11:37 AM    [facility] [address], [place], [place]  [address] Phone: [phone]; Fax: [phone]

## 2019-06-04 ENCOUNTER — Ambulatory Visit (INDEPENDENT_AMBULATORY_CARE_PROVIDER_SITE_OTHER): Payer: Medicare Other | Admitting: Physician Assistant

## 2019-06-04 ENCOUNTER — Encounter: Payer: Self-pay | Admitting: Physician Assistant

## 2019-06-04 ENCOUNTER — Other Ambulatory Visit: Payer: Self-pay

## 2019-06-04 VITALS — BP 146/72 | HR 54 | Ht 70.0 in | Wt 212.1 lb

## 2019-06-04 DIAGNOSIS — E785 Hyperlipidemia, unspecified: Secondary | ICD-10-CM | POA: Diagnosis not present

## 2019-06-04 DIAGNOSIS — I10 Essential (primary) hypertension: Secondary | ICD-10-CM

## 2019-06-04 DIAGNOSIS — I25119 Atherosclerotic heart disease of native coronary artery with unspecified angina pectoris: Secondary | ICD-10-CM | POA: Diagnosis not present

## 2019-06-04 DIAGNOSIS — I629 Nontraumatic intracranial hemorrhage, unspecified: Secondary | ICD-10-CM | POA: Diagnosis not present

## 2019-06-04 NOTE — Patient Instructions (Signed)
Medication Instructions:  No changes  If you need a refill on your cardiac medications before your next appointment, please call your pharmacy.   Lab work: None   If you have labs (blood work) drawn today and your tests are completely normal, you will receive your results only by: Marland Kitchen MyChart Message (if you have MyChart) OR . A paper copy in the mail If you have any lab test that is abnormal or we need to change your treatment, we will call you to review the results.  Testing/Procedures: None   Follow-Up: At [facility], you and your health needs are our priority.  As part of our continuing mission to provide you with exceptional heart care, we have created designated Provider Care Teams.  These Care Teams include your primary Cardiologist (physician) and Advanced Practice Providers (APPs -  Physician Assistants and Nurse Practitioners) who all work together to provide you with the care you need, when you need it. . Dr. Burt Knack or Richardson Dopp in mid to late Oct 2020  Any Other Special Instructions Will Be Listed Below (If Applicable).  If your blood pressure spikes to 160 or greater, call us or your primary care doctor right away. We will wait until Dr. Leonie Man says it is safe to start you back on Aspirin.

## 2019-06-05 NOTE — Progress Notes (Signed)
Scott - I'd be inclined to keep him off of ASA. He didn't have a lesion to account for his intracranial hemorrhage and his BP has been well-controlled over the years so I don't think it was a hypertensive event. He may have something like amyloid angiopathy where he would be at risk of re-bleed. Thanks for seeing him. We'll see what Dr Leonie Man thinks when he sees him back in October.

## 2019-06-05 NOTE — Progress Notes (Signed)
Agree with yiur plan

## 2019-07-14 ENCOUNTER — Encounter: Payer: Self-pay | Admitting: Neurology

## 2019-07-14 ENCOUNTER — Other Ambulatory Visit: Payer: Self-pay

## 2019-07-14 ENCOUNTER — Ambulatory Visit: Payer: Medicare Other | Admitting: Neurology

## 2019-07-14 ENCOUNTER — Telehealth: Payer: Self-pay | Admitting: Neurology

## 2019-07-14 VITALS — BP 151/76 | HR 56 | Temp 97.8°F | Wt 215.0 lb

## 2019-07-14 DIAGNOSIS — H53461 Homonymous bilateral field defects, right side: Secondary | ICD-10-CM

## 2019-07-14 DIAGNOSIS — I611 Nontraumatic intracerebral hemorrhage in hemisphere, cortical: Secondary | ICD-10-CM | POA: Diagnosis not present

## 2019-07-14 MED ORDER — ASPIRIN EC 81 MG PO TBEC: 81.0000 mg | DELAYED_RELEASE_TABLET | Freq: Every day | ORAL | 2 refills | Status: AC

## 2019-07-14 NOTE — Patient Instructions (Addendum)
I had a long d/w patient about his recent intracerebral hemorrhage , risk for recurrent stroke/TIAs, personally independently reviewed imaging studies and stroke evaluation results and answered questions.Resume aspirin 81 mg daily  for secondary cardiac prevention and maintain strict control of hypertension with blood pressure goal below 130/90, diabetes with hemoglobin A1c goal below 6.5% and lipids with LDL cholesterol goal below 70 mg/dL. I also advised the patient to eat a healthy diet with plenty of whole grains, cereals, fruits and vegetables, exercise regularly and maintain ideal body weight .repeat MRI scan of the brain with and without contrast to rule out any underlying structural or vascular lesion as a cause of the hemorrhage.  I have advised the patient to drive carefully particularly while changing lanes and to scan and move his head slowly to the right.  Followup in the future with my nurse practitioner Janett Billow in 6 months or call earlier if necessary.

## 2019-07-14 NOTE — Progress Notes (Signed)
[facility] [address] [place]. [place] [address] [phone]       OFFICE FOLLOW-UP NOTE  [name] Date of Birth:  1934/10/15 Medical Record Number:  [id number]   HPI: [name] Marks is a pleasant 84 year old Caucasian male seen today for initial office follow-up visit following hospital admission for intracerebral hemorrhage in August 2020.  He is accompanied by his daughter.  History is obtained from them and review of electronic medical records and I personally reviewed imaging films in PACS.  [name] Marks is a 84 year old male with past medical history of coronary artery disease, hypertension, hyperlipidemia who presented with sudden onset of right-sided visual field loss on 05/20/2019.  He went to bed sometime the night prior and woke up in the morning at 6 AM and had trouble seeing the table on the right side.  He went to see his eye doctor reported right visual field defect and referred him to the hospital for evaluation.  In route the patient noted he was unable to see the traffic lights on the right side of his field of vision.  He denied any accompanying headache, tingling numbness extremity weakness gait or balance problem.  CT scan of the head on admission showed a left occipital lobe hemorrhage with mild cytotoxic edema and small amounts of adjacent subarachnoid and subdural hemorrhage.  MRI scan of the brain showed unchanged appearance of the intraparenchymal hematoma in the left occipital lobe without any underlying structural or vascular lesion.  CT angiogram of the brain was unremarkable for age without significant large vessel stenosis or occlusion.  2D echo showed normal ejection fraction.  LDL cholesterol 57 mg percent.  Hemoglobin A1c was 5.8.  Patient was initially admitted to the ICU where blood pressure was tightly controlled subsequently was transferred to the floor.  He was seen by physical occupational and speech therapy.  He was discharged home  with instruction not to drive.  He states is done well since discharge and feels his peripheral vision loss is improving.  He in fact ophthalmologist last week who cleared him to drive.  Patient blood pressures well controlled at home and is never high though today it is elevated in the office at 157/71.  He denies any cognitive difficulties or memory problems and states he is independent in all activities of daily living except he has not started driving yet but would like to do so.  He has no complaints today.  He denies any family history of   Dementia.  ROS:   14 system review of systems is positive for vision difficulties only all other systems negative  PMH:  Past Medical History:  Diagnosis Date   Cerebrovascular disease    COPD (chronic obstructive pulmonary disease) (HCC)    DJD (degenerative joint disease)    Hyperlipidemia    Hypertension    Peripheral vascular disease (HCC)    Polyposis of colon    Subdural hematoma (HCC)    history of     Social History:  Social History   Socioeconomic History   Marital status: Married    Spouse name: Not on file   Number of children: Not on file   Years of education: Not on file   Highest education level: Not on file  Occupational History   Not on file  Social Needs   Financial resource strain: Not on file   Food insecurity    Worry: Not on file    Inability: Not on file  Transportation needs    Medical: Not on file    Non-medical: Not on file  Tobacco Use   Smoking status: Former Smoker    Quit date: 09/09/1971    Years since quitting: 47.8   Smokeless tobacco: Former NeurosurgeonUser    Types: Chew    Quit date: 09/09/1971  Substance and Sexual Activity   Alcohol use: Never    Frequency: Never   Drug use: Never   Sexual activity: Not on file  Lifestyle   Physical activity    Days per week: Not on file    Minutes per session: Not on file   Stress: Not on file  Relationships   Social connections     Talks on phone: Not on file    Gets together: Not on file    Attends religious service: Not on file    Active member of club or organization: Not on file    Attends meetings of clubs or organizations: Not on file    Relationship status: Not on file   Intimate partner violence    Fear of current or ex partner: Not on file    Emotionally abused: Not on file    Physically abused: Not on file    Forced sexual activity: Not on file  Other Topics Concern   Not on file  Social History Narrative   Not on file    Medications:   Current Outpatient Medications on File Prior to Visit  Medication Sig Dispense Refill   acetaminophen (TYLENOL) 325 MG tablet Take 2 tablets (650 mg total) by mouth every 6 (six) hours as needed for headache.     cholecalciferol (VITAMIN D) 1000 UNITS tablet Take 1,000 Units by mouth 2 (two) times daily.      isosorbide mononitrate (IMDUR) 30 MG 24 hr tablet Take 1 tablet (30 mg total) by mouth daily. 90 tablet 3   losartan (COZAAR) 100 MG tablet Take 100 mg by mouth daily.      metoprolol tartrate (LOPRESSOR) 25 MG tablet Take 1 tablet (25 mg total) by mouth 2 (two) times daily. 180 tablet 3   Multiple Vitamins-Minerals (PRESERVISION AREDS PO) Take 1 tablet by mouth 2 (two) times daily.      simvastatin (ZOCOR) 40 MG tablet TAKE 1 TABLET BY MOUTH EVERY NIGHT AT BEDTIME (Patient taking differently: Take 40 mg by mouth at bedtime. ) 90 tablet 3   No current facility-administered medications on file prior to visit.     Allergies:  No Known Allergies  Physical Exam General: well developed, well nourished, seated, in no evident distress Head: head normocephalic and atraumatic.  Neck: supple with no carotid or supraclavicular bruits Cardiovascular: regular rate and rhythm, no murmurs Musculoskeletal: no deformity Skin:  no rash/petichiae Vascular:  Normal pulses all extremities Vitals:   07/14/19 0857  BP: (!) 151/76  Pulse: (!) 56  Temp: 97.8 F (36.6  C)   Neurologic Exam Mental Status: Awake and fully alert. Oriented to place and time. Recent and remote memory intact. Attention span, concentration and fund of knowledge appropriate. Mood and affect appropriate.  Recall 3/3.  Clock drawing 3/4.  Able to name 10 animals which can walk on 4 legs. Cranial Nerves: Fundoscopic exam reveals sharp disc margins. Pupils equal, briskly reactive to light. Extraocular movements full without nystagmus. Visual fields show partial right inferior temporal visual field defect to confrontation. Hearing intact. Facial sensation intact. Face, tongue, palate moves normally and symmetrically.  Motor: Normal bulk and tone. Normal strength  in all tested extremity muscles. Sensory.: intact to touch ,pinprick .position and vibratory sensation.  Coordination: Rapid alternating movements normal in all extremities. Finger-to-nose and heel-to-shin performed accurately bilaterally. Gait and Station: Arises from chair without difficulty. Stance is normal. Gait demonstrates normal stride length and balance . Able to heel, toe and tandem walk without difficulty.  Reflexes: 1+ and symmetric. Toes downgoing.   NIHSS  1 Modified Rankin  2   ASSESSMENT: 84 year old Caucasian male with left parieto-occipital intracerebral hemorrhage of indeterminate etiology in August 2020 possibly include amyloid angiopathy versus hypertensive hemorrhagic infarct less likely.  He is doing well partial right temporal inferior visual field defect     PLAN: I had a long d/w patient about his recent intracerebral hemorrhage , risk for recurrent stroke/TIAs, personally independently reviewed imaging studies and stroke evaluation results and answered questions.Resume aspirin 81 mg daily  for secondary cardiac prevention and maintain strict control of hypertension with blood pressure goal below 130/90, diabetes with hemoglobin A1c goal below 6.5% and lipids with LDL cholesterol goal below 70 mg/dL. I  also advised the patient to eat a healthy diet with plenty of whole grains, cereals, fruits and vegetables, exercise regularly and maintain ideal body weight .repeat MRI scan of the brain with and without contrast to rule out any underlying structural or vascular lesion as a cause of the hemorrhage.  I have advised the patient to drive carefully particularly while changing lanes and to scan and move his head slowly to the right.  Followup in the future with my nurse practitioner Shanda Bumps in 6 months or call earlier if necessary.  Greater than 50% of time during this 25 minute visit was spent on counseling,explanation of diagnosis, planning of further management, discussion with patient and family and coordination of care [name] Heady, MD  [facility] [address] [place], [place] [address]  Phone [phone] Fax [phone] Note: This document was prepared with digital dictation and possible smart phrase technology. Any transcriptional errors that result from this process are unintentional

## 2019-07-14 NOTE — Telephone Encounter (Signed)
[employer] medicare order sent to GI. No auth they will reach out to the patient to schedule.  

## 2019-08-04 ENCOUNTER — Other Ambulatory Visit: Payer: Self-pay

## 2019-08-04 ENCOUNTER — Encounter: Payer: Self-pay | Admitting: Cardiovascular Disease

## 2019-08-04 ENCOUNTER — Ambulatory Visit: Payer: Medicare Other | Admitting: Cardiovascular Disease

## 2019-08-04 VITALS — BP 130/80 | HR 65 | Ht 70.0 in | Wt 211.2 lb

## 2019-08-04 DIAGNOSIS — I2510 Atherosclerotic heart disease of native coronary artery without angina pectoris: Secondary | ICD-10-CM | POA: Diagnosis not present

## 2019-08-04 DIAGNOSIS — E782 Mixed hyperlipidemia: Secondary | ICD-10-CM

## 2019-08-04 DIAGNOSIS — I10 Essential (primary) hypertension: Secondary | ICD-10-CM

## 2019-08-04 NOTE — Progress Notes (Signed)
Cardiology Office Note:    Date:  08/08/2019   ID:  [name], DOB 1935/09/11, MRN [id number]  PCP:  Elizabeth Palau, FNP  Cardiologist:  Tonny Bollman, MD  Electrophysiologist:  None   Referring MD: Elizabeth Palau, FNP   Chief Complaint  Patient presents with  . Coronary Artery Disease   History of Present Illness:    [name] Marks is a 84 y.o. male with a hx of coronary artery disease status post PCI of the LAD, hypertension, and mixed hyperlipidemia.  The patient suffered an intracerebral hemorrhage in August 2020 with resultant right homonymous hemianopsia.  At that time his aspirin was held.  He is left with some residual right-sided weakness and visual field deficit.  He has worked with physical therapy.  He is back on low-dose aspirin and tolerating it without problems.  He denies exertional chest pain or pressure, edema, orthopnea, or PND.  He has chronic exertional dyspnea with no significant change.  He had an isolated episode of nocturnal chest discomfort recently was self-limited and did not require any specific treatment.  He has had occasional spikes in his blood pressure.  Past Medical History:  Diagnosis Date  . Cerebrovascular disease   . COPD (chronic obstructive pulmonary disease) (HCC)   . DJD (degenerative joint disease)   . Hyperlipidemia   . Hypertension   . Peripheral vascular disease (HCC)   . Polyposis of colon   . Subdural hematoma (HCC)    history of     Past Surgical History:  Procedure Laterality Date  . CARDIAC CATHETERIZATION  03/21/2010   Patent LAD (coronary artery) stents - No significant obstructive disease in the left circumflex -- Patent RCA with moderately severe ostial stenosis of the posterior descending (coronary) artery branch.  . CARDIAC CATHETERIZATION  08/01/2009   . LEFT HEART CATHETERIZATION WITH CORONARY ANGIOGRAM N/A 11/08/2013   Procedure: LEFT HEART CATHETERIZATION WITH CORONARY ANGIOGRAM;  Surgeon: Micheline Chapman,  MD;  Location: [facility] CATH LAB;  Service: Cardiovascular;  Laterality: N/A;    Current Medications: Current Meds  Medication Sig  . acetaminophen (TYLENOL) 325 MG tablet Take 2 tablets (650 mg total) by mouth every 6 (six) hours as needed for headache.  Marland Kitchen aspirin EC 81 MG tablet Take 1 tablet (81 mg total) by mouth daily.  . cholecalciferol (VITAMIN D) 1000 UNITS tablet Take 1,000 Units by mouth 2 (two) times daily.   . isosorbide mononitrate (IMDUR) 30 MG 24 hr tablet Take 1 tablet (30 mg total) by mouth daily.  Marland Kitchen losartan (COZAAR) 100 MG tablet Take 100 mg by mouth daily.   . metoprolol tartrate (LOPRESSOR) 25 MG tablet Take 1 tablet (25 mg total) by mouth 2 (two) times daily.  . Multiple Vitamins-Minerals (PRESERVISION AREDS PO) Take 1 tablet by mouth 2 (two) times daily.   . simvastatin (ZOCOR) 40 MG tablet TAKE 1 TABLET BY MOUTH EVERY NIGHT AT BEDTIME     Allergies:   Patient has no known allergies.   Social History   Socioeconomic History  . Marital status: Married    Spouse name: Not on file  . Number of children: Not on file  . Years of education: Not on file  . Highest education level: Not on file  Occupational History  . Not on file  Social Needs  . Financial resource strain: Not on file  . Food insecurity    Worry: Not on file    Inability: Not on file  . Transportation needs  Medical: Not on file    Non-medical: Not on file  Tobacco Use  . Smoking status: Former Smoker    Quit date: 09/09/1971    Years since quitting: 47.9  . Smokeless tobacco: Former Systems developer    Types: [place] date: 09/09/1971  Substance and Sexual Activity  . Alcohol use: Never    Frequency: Never  . Drug use: Never  . Sexual activity: Not on file  Lifestyle  . Physical activity    Days per week: Not on file    Minutes per session: Not on file  . Stress: Not on file  Relationships  . Social Herbalist on phone: Not on file    Gets together: Not on file    Attends religious  service: Not on file    Active member of club or organization: Not on file    Attends meetings of clubs or organizations: Not on file    Relationship status: Not on file  Other Topics Concern  . Not on file  Social History Narrative  . Not on file     Family History: The patient's family history includes Breast cancer in his sister and sister; Heart attack in his father and sister; Heart disease in his brother.  ROS:   Please see the history of present illness.    All other systems reviewed and are negative.  EKGs/Labs/Other Studies Reviewed:    The following studies were reviewed today: Echo 05-23-2019: IMPRESSIONS    1. The left ventricle has normal systolic function with an ejection fraction of 60-65%. The cavity size was normal. Left ventricular diastolic Doppler parameters are consistent with impaired relaxation.  2. The right ventricle has normal systolic function. The cavity was normal.  3. The tricuspid valve is grossly normal.  4. The aortic valve is tricuspid. Mild thickening of the aortic valve. No stenosis of the aortic valve.  5. The aorta is normal unless otherwise noted.  6. Normal LV systolic function; mild diastolic dysfunction.  EKG:  EKG is not ordered today.    Recent Labs: 05/21/2019: ALT 23; BUN 21; Creatinine, Ser 1.26; Hemoglobin 13.5; Platelets 236; Potassium 3.6; Sodium 139  Recent Lipid Panel    Component Value Date/Time   CHOL 123 05/21/2019 2045   TRIG 72 05/21/2019 2045   HDL 52 05/21/2019 2045   CHOLHDL 2.4 05/21/2019 2045   VLDL 14 05/21/2019 2045   LDLCALC 57 05/21/2019 2045    Physical Exam:    VS:  BP 130/80   Pulse 65   Ht 5\' 10"  (1.778 m)   Wt 211 lb 3.2 oz (95.8 kg)   SpO2 96%   BMI 30.30 kg/m     Wt Readings from Last 3 Encounters:  08/04/19 211 lb 3.2 oz (95.8 kg)  07/14/19 215 lb (97.5 kg)  06/04/19 212 lb 1.9 oz (96.2 kg)     GEN: Well nourished, well developed in no acute distress HEENT: Normal NECK: No JVD;  No carotid bruits LYMPHATICS: No lymphadenopathy CARDIAC: RRR, no murmurs, rubs, gallops RESPIRATORY:  Clear to auscultation without rales, wheezing or rhonchi  ABDOMEN: Soft, non-tender, non-distended MUSCULOSKELETAL:  No edema; No deformity  SKIN: Warm and dry NEUROLOGIC:  Alert and oriented x 3 PSYCHIATRIC:  Normal affect   ASSESSMENT:    1. Coronary artery disease involving native coronary artery of native heart without angina pectoris   2. Essential hypertension   3. Mixed hyperlipidemia    PLAN:  In order of problems listed above:  1. Patient is stable without symptoms of angina.  He is treated with aspirin, statin drug, and a beta-blocker. 2. Blood pressure is reasonably well controlled on isosorbide, losartan, and metoprolol.  I reviewed his home readings and he has had a few readings with a systolic pressure greater than 160.  We gave him instructions to take an extra metoprolol 25 mg as needed for systolic blood pressure greater than 160 mmHg. 3. Lipids are reviewed and he is treated with simvastatin 40 mg daily.  Recent LDL cholesterol was 57 mg/dL, and HDL 52 mg/dL.   Medication Adjustments/Labs and Tests Ordered: Current medicines are reviewed at length with the patient today.  Concerns regarding medicines are outlined above.  No orders of the defined types were placed in this encounter.  No orders of the defined types were placed in this encounter.   Patient Instructions  Medication Instructions:  1) If your systolic blood pressure (the top number) is above 160, please take an extra metoprolol (once daily) *If you need a refill on your cardiac medications before your next appointment, please call your pharmacy*   Follow-Up: At [facility], you and your health needs are our priority.  As part of our continuing mission to provide you with exceptional heart care, we have created designated Provider Care Teams.  These Care Teams include your primary Cardiologist  (physician) and Advanced Practice Providers (APPs -  Physician Assistants and Nurse Practitioners) who all work together to provide you with the care you need, when you need it. Your next appointment:   6 months The format for your next appointment:   In Person Provider:   You may see Tonny BollmanMichael Reilley Latorre, MD or one of the following Advanced Practice Providers on your designated Care Team:    Tereso NewcomerScott Weaver, PA-C  Vin Port AlleganyBhagat, PA-C  Berton BonJanine Hammond, [place]NP     Signed, Tonny BollmanMichael Liahna Brickner, MD  08/08/2019 11:38 AM    [name] Marks

## 2019-08-04 NOTE — Patient Instructions (Signed)
Medication Instructions:  1) If your systolic blood pressure (the top number) is above 160, please take an extra metoprolol (once daily) *If you need a refill on your cardiac medications before your next appointment, please call your pharmacy*   Follow-Up: At [facility], you and your health needs are our priority.  As part of our continuing mission to provide you with exceptional heart care, we have created designated Provider Care Teams.  These Care Teams include your primary Cardiologist (physician) and Advanced Practice Providers (APPs -  Physician Assistants and Nurse Practitioners) who all work together to provide you with the care you need, when you need it. Your next appointment:   6 months The format for your next appointment:   In Person Provider:   You may see Sherren Mocha, MD or one of the following Advanced Practice Providers on your designated Care Team:    Richardson Dopp, PA-C  Vin [place], [place]  Daune Perch, [place]

## 2019-08-08 ENCOUNTER — Encounter: Payer: Self-pay | Admitting: Cardiovascular Disease

## 2019-08-11 ENCOUNTER — Ambulatory Visit
Admission: RE | Admit: 2019-08-11 | Discharge: 2019-08-11 | Disposition: A | Discharge status: Home / Self Care | Payer: Medicare Other | Source: Ambulatory Visit | Attending: Neurology | Admitting: Neurology

## 2019-08-11 ENCOUNTER — Other Ambulatory Visit: Payer: Self-pay

## 2019-08-11 DIAGNOSIS — I611 Nontraumatic intracerebral hemorrhage in hemisphere, cortical: Secondary | ICD-10-CM | POA: Diagnosis not present

## 2019-08-11 DIAGNOSIS — H53461 Homonymous bilateral field defects, right side: Secondary | ICD-10-CM

## 2019-08-11 MED ORDER — GADOBENATE DIMEGLUMINE 529 MG/ML IV SOLN
18.0000 mL | Freq: Once | INTRAVENOUS | Status: AC | PRN
  Administered 2019-08-11: 17:00:00 18 mL via INTRAVENOUS

## 2019-08-27 ENCOUNTER — Other Ambulatory Visit (HOSPITAL_COMMUNITY): Payer: Self-pay | Admitting: Neurology

## 2019-08-27 DIAGNOSIS — I611 Nontraumatic intracerebral hemorrhage in hemisphere, cortical: Secondary | ICD-10-CM

## 2019-08-27 NOTE — Progress Notes (Signed)
I returned call from patient`s wife and informed her about recent brain MRI finding of resolving ICH but enhancing tubular vessel in [place] bed raises question of avm versus thrombosed vein which needs eval with a daignostic cerebral catheter angio and she was in agreement with plan. I ordered angio and will send epic message to Dr Estanislado Pandy

## 2019-09-07 ENCOUNTER — Telehealth (HOSPITAL_COMMUNITY): Payer: Self-pay

## 2019-09-07 NOTE — Telephone Encounter (Signed)
Called to reschedule angio, no answer, left vm. AW 

## 2019-09-09 ENCOUNTER — Ambulatory Visit (HOSPITAL_COMMUNITY): Admission: RE | Admit: 2019-09-09 | Payer: Medicare Other | Source: Ambulatory Visit

## 2019-09-13 ENCOUNTER — Other Ambulatory Visit: Payer: Self-pay | Admitting: Student

## 2019-09-14 ENCOUNTER — Other Ambulatory Visit (HOSPITAL_COMMUNITY): Payer: Self-pay | Admitting: Neurology

## 2019-09-14 ENCOUNTER — Ambulatory Visit (HOSPITAL_COMMUNITY)
Admission: RE | Admit: 2019-09-14 | Discharge: 2019-09-14 | Disposition: A | Discharge status: Home / Self Care | Payer: Medicare Other | Source: Ambulatory Visit | Attending: Neurology | Admitting: Neurology

## 2019-09-14 ENCOUNTER — Other Ambulatory Visit: Payer: Self-pay

## 2019-09-14 DIAGNOSIS — I2510 Atherosclerotic heart disease of native coronary artery without angina pectoris: Secondary | ICD-10-CM | POA: Insufficient documentation

## 2019-09-14 DIAGNOSIS — Z7982 Long term (current) use of aspirin: Secondary | ICD-10-CM | POA: Insufficient documentation

## 2019-09-14 DIAGNOSIS — I10 Essential (primary) hypertension: Secondary | ICD-10-CM | POA: Diagnosis not present

## 2019-09-14 DIAGNOSIS — Z8249 Family history of ischemic heart disease and other diseases of the circulatory system: Secondary | ICD-10-CM | POA: Insufficient documentation

## 2019-09-14 DIAGNOSIS — Z955 Presence of coronary angioplasty implant and graft: Secondary | ICD-10-CM | POA: Insufficient documentation

## 2019-09-14 DIAGNOSIS — I611 Nontraumatic intracerebral hemorrhage in hemisphere, cortical: Secondary | ICD-10-CM | POA: Diagnosis present

## 2019-09-14 DIAGNOSIS — E785 Hyperlipidemia, unspecified: Secondary | ICD-10-CM | POA: Diagnosis not present

## 2019-09-14 DIAGNOSIS — Z87891 Personal history of nicotine dependence: Secondary | ICD-10-CM | POA: Diagnosis not present

## 2019-09-14 DIAGNOSIS — J449 Chronic obstructive pulmonary disease, unspecified: Secondary | ICD-10-CM | POA: Diagnosis not present

## 2019-09-14 DIAGNOSIS — I739 Peripheral vascular disease, unspecified: Secondary | ICD-10-CM | POA: Diagnosis not present

## 2019-09-14 HISTORY — PX: IR US GUIDE VASC ACCESS RIGHT: IMG2390

## 2019-09-14 HISTORY — PX: IR ANGIO VERTEBRAL SEL SUBCLAVIAN INNOMINATE UNI L MOD SED: IMG5364

## 2019-09-14 HISTORY — PX: IR ANGIO VERTEBRAL SEL VERTEBRAL UNI R MOD SED: IMG5368

## 2019-09-14 HISTORY — PX: IR ANGIO INTRA EXTRACRAN SEL COM CAROTID INNOMINATE BILAT MOD SED: IMG5360

## 2019-09-14 LAB — BASIC METABOLIC PANEL
Anion gap: 11 (ref 5–15)
BUN: 19 mg/dL (ref 8–23)
CO2: 24 mmol/L (ref 22–32)
Calcium: 9.1 mg/dL (ref 8.9–10.3)
Chloride: 107 mmol/L (ref 98–111)
Creatinine, Ser: 1.11 mg/dL (ref 0.61–1.24)
GFR calc Af Amer: >60 mL/min (ref >60)
GFR calc non Af Amer: >60 mL/min (ref >60)
Glucose, Bld: 104 mg/dL — ABNORMAL HIGH (ref 70–99)
Potassium: 3.9 mmol/L (ref 3.5–5.1)
Sodium: 142 mmol/L (ref 135–145)

## 2019-09-14 LAB — CBC
HCT: 39.4 % (ref 39.0–52.0)
Hemoglobin: 13.3 g/dL (ref 13.0–17.0)
MCH: 32.8 pg (ref 26.0–34.0)
MCHC: 33.8 g/dL (ref 30.0–36.0)
MCV: 97.3 fL (ref 80.0–100.0)
Platelets: 217 10*3/uL (ref 150–400)
RBC: 4.05 MIL/uL — ABNORMAL LOW (ref 4.22–5.81)
RDW: 13.2 % (ref 11.5–15.5)
WBC: 7 10*3/uL (ref 4.0–10.5)
nRBC: 0 % (ref 0.0–0.2)

## 2019-09-14 LAB — PROTIME-INR
INR: 1 (ref 0.8–1.2)
Prothrombin Time: 13.5 seconds (ref 11.4–15.2)

## 2019-09-14 LAB — APTT: aPTT: 29 seconds (ref 24–36)

## 2019-09-14 MED ORDER — HEPARIN SODIUM (PORCINE) 1000 UNIT/ML IJ SOLN
INTRAMUSCULAR | Status: AC
  Filled 2019-09-14: qty 1

## 2019-09-14 MED ORDER — MIDAZOLAM HCL 2 MG/2ML IJ SOLN
INTRAMUSCULAR | Status: AC
  Filled 2019-09-14: qty 2

## 2019-09-14 MED ORDER — FENTANYL CITRATE (PF) 100 MCG/2ML IJ SOLN
INTRAMUSCULAR | Status: AC | PRN
  Administered 2019-09-14: 25 ug via INTRAVENOUS

## 2019-09-14 MED ORDER — SODIUM CHLORIDE 0.9 % IV SOLN: INTRAVENOUS | Status: DC

## 2019-09-14 MED ORDER — HEPARIN SODIUM (PORCINE) 1000 UNIT/ML IJ SOLN
INTRAMUSCULAR | Status: AC | PRN
  Administered 2019-09-14: 2000 [IU] via INTRAVENOUS

## 2019-09-14 MED ORDER — IOHEXOL 300 MG/ML  SOLN
150.0000 mL | Freq: Once | INTRAMUSCULAR | Status: AC | PRN
  Administered 2019-09-14: 120 mL via INTRA_ARTERIAL

## 2019-09-14 MED ORDER — MIDAZOLAM HCL 2 MG/2ML IJ SOLN
INTRAMUSCULAR | Status: AC | PRN
  Administered 2019-09-14: 1 mg via INTRAVENOUS

## 2019-09-14 MED ORDER — NITROGLYCERIN 1 MG/10 ML FOR IR/CATH LAB
INTRA_ARTERIAL | Status: AC
  Filled 2019-09-14: qty 10

## 2019-09-14 MED ORDER — SODIUM CHLORIDE 0.9 % IV SOLN
INTRAVENOUS | Status: AC | PRN
  Administered 2019-09-14: 10 mL/h via INTRAVENOUS

## 2019-09-14 MED ORDER — LIDOCAINE HCL 1 % IJ SOLN
INTRAMUSCULAR | Status: AC | PRN
  Administered 2019-09-14: 2 mL

## 2019-09-14 MED ORDER — VERAPAMIL HCL 2.5 MG/ML IV SOLN
INTRAVENOUS | Status: AC
  Filled 2019-09-14: qty 2

## 2019-09-14 MED ORDER — NITROGLYCERIN 0.4 MG SL SUBL
SUBLINGUAL_TABLET | SUBLINGUAL | Status: AC
  Filled 2019-09-14: qty 1

## 2019-09-14 MED ORDER — FENTANYL CITRATE (PF) 100 MCG/2ML IJ SOLN
INTRAMUSCULAR | Status: AC
  Filled 2019-09-14: qty 2

## 2019-09-14 MED ORDER — SODIUM CHLORIDE 0.9 % IV SOLN: INTRAVENOUS | Status: AC

## 2019-09-14 MED ORDER — NITROGLYCERIN 1 MG/10 ML FOR IR/CATH LAB
INTRA_ARTERIAL | Status: AC | PRN
  Administered 2019-09-14 (×2): 200 ug via INTRA_ARTERIAL

## 2019-09-14 MED ORDER — VERAPAMIL HCL 2.5 MG/ML IV SOLN
INTRAVENOUS | Status: AC | PRN
  Administered 2019-09-14: 2.5 mg via INTRA_ARTERIAL

## 2019-09-14 MED ORDER — LIDOCAINE HCL 1 % IJ SOLN
INTRAMUSCULAR | Status: AC
  Filled 2019-09-14: qty 20

## 2019-09-14 NOTE — Procedures (Signed)
S/P 4 v essel cerebral arteriogram RT radial approach.  Findings. 1. Severe stenosis ? Occlusion of RT transverse sinus . S.Bilan Tedesco MD

## 2019-09-14 NOTE — Discharge Instructions (Addendum)
Radial Site Care ° °This sheet gives you information about how to care for yourself after your procedure. Your health care provider may also give you more specific instructions. If you have problems or questions, contact your health care provider. °What can I expect after the procedure? °After the procedure, it is common to have: °· Bruising and tenderness at the catheter insertion area. °Follow these instructions at home: °Medicines °· Take over-the-counter and prescription medicines only as told by your health care provider. °Insertion site care °· Follow instructions from your health care provider about how to take care of your insertion site. Make sure you: °? Wash your hands with soap and water before you change your bandage (dressing). If soap and water are not available, use hand sanitizer. °? Change your dressing as told by your health care provider. °? Leave stitches (sutures), skin glue, or adhesive strips in place. These skin closures may need to stay in place for 2 weeks or longer. If adhesive strip edges start to loosen and curl up, you may trim the loose edges. Do not remove adhesive strips completely unless your health care provider tells you to do that. °· Check your insertion site every day for signs of infection. Check for: °? Redness, swelling, or pain. °? Fluid or blood. °? Pus or a bad smell. °? Warmth. °· Do not take baths, swim, or use a hot tub until your health care provider approves. °· You may shower 24-48 hours after the procedure, or as directed by your health care provider. °? Remove the dressing and gently wash the site with plain soap and water. °? Pat the area dry with a clean towel. °? Do not rub the site. That could cause bleeding. °· Do not apply powder or lotion to the site. °Activity ° °· For 24 hours after the procedure, or as directed by your health care provider: °? Do not flex or bend the affected arm. °? Do not push or pull heavy objects with the affected arm. °? Do not  drive yourself home from the hospital or clinic. You may drive 24 hours after the procedure unless your health care provider tells you not to. °? Do not operate machinery or power tools. °· Do not lift anything that is heavier than 10 lb (4.5 kg), or the limit that you are told, until your health care provider says that it is safe. °· Ask your health care provider when it is okay to: °? Return to work or school. °? Resume usual physical activities or sports. °? Resume sexual activity. °General instructions °· If the catheter site starts to bleed, raise your arm and put firm pressure on the site. If the bleeding does not stop, get help right away. This is a medical emergency. °· If you went home on the same day as your procedure, a responsible adult should be with you for the first 24 hours after you arrive home. °· Keep all follow-up visits as told by your health care provider. This is important. °Contact a health care provider if: °· You have a fever. °· You have redness, swelling, or yellow drainage around your insertion site. °Get help right away if: °· You have unusual pain at the radial site. °· The catheter insertion area swells very fast. °· The insertion area is bleeding, and the bleeding does not stop when you hold steady pressure on the area. °· Your arm or hand becomes pale, cool, tingly, or numb. °These symptoms may represent a serious problem   that is an emergency. Do not wait to see if the symptoms will go away. Get medical help right away. Call your local emergency services (911 in the [place] Do not drive yourself to the hospital. °Summary °· After the procedure, it is common to have bruising and tenderness at the site. °· Follow instructions from your health care provider about how to take care of your radial site wound. Check the wound every day for signs of infection. °· Do not lift anything that is heavier than 10 lb (4.5 kg), or the limit that you are told, until your health care provider says  that it is safe. °This information is not intended to replace advice given to you by your health care provider. Make sure you discuss any questions you have with your health care provider. °Document Released: 10/26/2010 Document Revised: 10/29/2017 Document Reviewed: 10/29/2017 °Elsevier Patient Education © 2020 [employer] Inc[employer] ° ° °Angiogram, Care After °This sheet gives you information about how to care for yourself after your procedure. Your doctor may also give you more specific instructions. If you have problems or questions, contact your doctor. °Follow these instructions at home: °Insertion site care °· Follow instructions from your doctor about how to take care of your long, thin tube (catheter) insertion area. Make sure you: °? Wash your hands with soap and water before you change your bandage (dressing). If you cannot use soap and water, use hand sanitizer. °? Change your bandage as told by your doctor. °? Leave stitches (sutures), skin glue, or skin tape (adhesive) strips in place. They may need to stay in place for 2 weeks or longer. If tape strips get loose and curl up, you may trim the loose edges. Do not remove tape strips completely unless your doctor says it is okay. °· Do not take baths, swim, or use a hot tub until your doctor says it is okay. °· You may shower 24-48 hours after the procedure or as told by your doctor. °? Gently wash the area with plain soap and water. °? Pat the area dry with a clean towel. °? Do not rub the area. This may cause bleeding. °· Do not apply powder or lotion to the area. Keep the area clean and dry. °· Check your insertion area every day for signs of infection. Check for: °? More redness, swelling, or pain. °? Fluid or blood. °? Warmth. °? Pus or a bad smell. °Activity °· Rest as told by your doctor, usually for 1-2 days. °· Do not lift anything that is heavier than 10 lbs. (4.5 kg) or as told by your doctor. °· Do not drive for 24 hours if you were given a medicine to  help you relax (sedative). °· Do not drive or use heavy machinery while taking prescription pain medicine. °General instructions ° °· Go back to your normal activities as told by your doctor, usually in about a week. Ask your doctor what activities are safe for you. °· If the insertion area starts to bleed, lie flat and put pressure on the area. If the bleeding does not stop, get help right away. This is an emergency. °· Drink enough fluid to keep your pee (urine) clear or pale yellow. °· Take over-the-counter and prescription medicines only as told by your doctor. °· Keep all follow-up visits as told by your doctor. This is important. °Contact a doctor if: °· You have a fever. °· You have chills. °· You have more redness, swelling, or pain around your insertion   area. °· You have fluid or blood coming from your insertion area. °· The insertion area feels warm to the touch. °· You have pus or a bad smell coming from your insertion area. °· You have more bruising around the insertion area. °· Blood collects in the tissue around the insertion area (hematoma) that may be painful to the touch. °Get help right away if: °· You have a lot of pain in the insertion area. °· The insertion area swells very fast. °· The insertion area is bleeding, and the bleeding does not stop after holding steady pressure on the area. °· The area near or just beyond the insertion area becomes pale, cool, tingly, or numb. °These symptoms may be an emergency. Do not wait to see if the symptoms will go away. Get medical help right away. Call your local emergency services (911 in the [place] Do not drive yourself to the hospital. °Summary °· After the procedure, it is common to have bruising and tenderness at the long, thin tube insertion area. °· After the procedure, it is important to rest and drink plenty of fluids. °· Do not take baths, swim, or use a hot tub until your doctor says it is okay to do so. You may shower 24-48 hours after the  procedure or as told by your doctor. °· If the insertion area starts to bleed, lie flat and put pressure on the area. If the bleeding does not stop, get help right away. This is an emergency. °This information is not intended to replace advice given to you by your health care provider. Make sure you discuss any questions you have with your health care provider. °Document Released: 12/20/2008 Document Revised: 09/05/2017 Document Reviewed: 09/17/2016 °Elsevier Patient Education © 2020 [employer] Inc. ° ° ° °Moderate Conscious Sedation, Adult, Care After °These instructions provide you with information about caring for yourself after your procedure. Your health care provider may also give you more specific instructions. Your treatment has been planned according to current medical practices, but problems sometimes occur. Call your health care provider if you have any problems or questions after your procedure. °What can I expect after the procedure? °After your procedure, it is common: °· To feel sleepy for several hours. °· To feel clumsy and have poor balance for several hours. °· To have poor judgment for several hours. °· To vomit if you eat too soon. °Follow these instructions at home: °For at least 24 hours after the procedure: ° °· Do not: °? Participate in activities where you could fall or become injured. °? Drive. °? Use heavy machinery. °? Drink alcohol. °? Take sleeping pills or medicines that cause drowsiness. °? Make important decisions or sign legal documents. °? Take care of children on your own. °· Rest. °Eating and drinking °· Follow the diet recommended by your health care provider. °· If you vomit: °? Drink water, juice, or soup when you can drink without vomiting. °? Make sure you have little or no nausea before eating solid foods. °General instructions °· Have a responsible adult stay with you until you are awake and alert. °· Take over-the-counter and prescription medicines only as told by your  health care provider. °· If you smoke, do not smoke without supervision. °· Keep all follow-up visits as told by your health care provider. This is important. °Contact a health care provider if: °· You keep feeling nauseous or you keep vomiting. °· You feel light-headed. °· You develop a rash. °· You have a   fever. °Get help right away if: °· You have trouble breathing. °This information is not intended to replace advice given to you by your health care provider. Make sure you discuss any questions you have with your health care provider. °Document Released: 07/14/2013 Document Revised: 09/05/2017 Document Reviewed: 01/13/2016 °Elsevier Patient Education © 2020 Elsevier Inc. ° °

## 2019-09-14 NOTE — Sedation Documentation (Signed)
ETC02 removed per Dr. Deveshwar  

## 2019-09-14 NOTE — H&P (Signed)
Chief Complaint: Patient was seen in consultation today for diagnostic cerebral angiogram  Referring Physician(s): Sethi,Pramod S  Supervising Physician: Julieanne Cottoneveshwar, Sanjeev  [facility]  History of Present Illness: [name] Marks is a 84 y.o. male with a past medical history significant for DJD, COPD, HLD, HTN and history of ICH followed by Dr. Pearlean BrownieSethi who presents today for a diagnostic cerebral angiogram. [name] Marks [facility] with complaints of vision loss and he was subsequently found to have an acute left occipital lobe hemorrhage with small amount of adjacent subarachnoid and subdural hemorrhage. He was admitted for further evaluation and management, ultimately being discharged on 8/16 with planned outpatient follow up. He has been followed by Dr. Pearlean BrownieSethi since that time and underwent an MRI brain on 08/13/19 for routine follow up which noted resolving ICH however there was an area of enhancing tubular vessel in the ICH bed concerning for possible AVM vs thrombosed vein. NIR has been asked to perform a diagnostic angiogram to further evaluate these imaging findings.  [name] Marks denies any complaints besides hunger and a slight headache today. He denies any neurologic complaints and reports that his vision loss has essentially resolved. He has been performing all ADLs independently without difficulty. He continues to take ASA 81 mg QD. He states understanding of the requested procedure and wishes to proceed.   Past Medical History:  Diagnosis Date  . Cerebrovascular disease   . COPD (chronic obstructive pulmonary disease) (HCC)   . DJD (degenerative joint disease)   . Hyperlipidemia   . Hypertension   . Peripheral vascular disease (HCC)   . Polyposis of colon   . Subdural hematoma (HCC)    history of     Past Surgical History:  Procedure Laterality Date  . CARDIAC CATHETERIZATION  03/21/2010   Patent LAD (coronary artery)  stents - No significant obstructive disease in the left circumflex -- Patent RCA with moderately severe ostial stenosis of the posterior descending (coronary) artery branch.  . CARDIAC CATHETERIZATION  08/01/2009   . LEFT HEART CATHETERIZATION WITH CORONARY ANGIOGRAM N/A 11/08/2013   Procedure: LEFT HEART CATHETERIZATION WITH CORONARY ANGIOGRAM;  Surgeon: Micheline ChapmanMichael D Cooper, MD;  [facility];  Service: Cardiovascular;  Laterality: N/A;    Allergies: Patient has no known allergies.  Medications: Prior to Admission medications   Medication Sig Start Date End Date Taking? Authorizing Provider  acetaminophen (TYLENOL) 325 MG tablet Take 2 tablets (650 mg total) by mouth every 6 (six) hours as needed for headache. 05/23/19  Yes Rinehuls, Kinnie Scalesavid L, PA-C  aspirin EC 81 MG tablet Take 1 tablet (81 mg total) by mouth daily. 07/14/19 07/13/20 Yes Micki RileySethi, Pramod S, MD  cholecalciferol (VITAMIN D) 1000 UNITS tablet Take 1,000 Units by mouth 2 (two) times daily.    Yes [provider]  guaiFENesin (MUCINEX) 600 MG 12 hr tablet Take 600 mg by mouth 2 (two) times daily as needed for cough.   Yes [provider]  isosorbide mononitrate (IMDUR) 30 MG 24 hr tablet Take 1 tablet (30 mg total) by mouth daily. 11/02/18  Yes Tonny Bollmanooper, Michael, MD  loratadine (CLARITIN) 10 MG tablet Take 10 mg by mouth daily as needed for allergies.   Yes [provider]  losartan (COZAAR) 100 MG tablet Take 100 mg by mouth daily.    Yes [provider]  metoprolol tartrate (LOPRESSOR) 25 MG tablet Take 1 tablet (25 mg total) by mouth 2 (two) times  daily. 11/02/18  Yes Tonny Bollman, MD  Multiple Vitamins-Minerals (PRESERVISION AREDS PO) Take 1 tablet by mouth 2 (two) times daily.    Yes [provider]  simvastatin (ZOCOR) 40 MG tablet TAKE 1 TABLET BY MOUTH EVERY NIGHT AT BEDTIME 12/02/18  Yes Tonny Bollman, MD     Family History  Problem Relation Age of Onset  . Heart attack Father    . Heart attack Sister   . Breast cancer Sister   . Breast cancer Sister   . Heart disease Brother     Social History   Socioeconomic History  . Marital status: Married    Spouse name: Not on file  . Number of children: Not on file  . Years of education: Not on file  . Highest education level: Not on file  Occupational History  . Not on file  Social Needs  . Financial resource strain: Not on file  . Food insecurity    Worry: Not on file    Inability: Not on file  . Transportation needs    Medical: Not on file    Non-medical: Not on file  Tobacco Use  . Smoking status: Former Smoker    Quit date: 09/09/1971    Years since quitting: 48.0  . Smokeless tobacco: Former Neurosurgeon    Types: Chew    Quit date: 09/09/1971  Substance and Sexual Activity  . Alcohol use: Never    Frequency: Never  . Drug use: Never  . Sexual activity: Not on file  Lifestyle  . Physical activity    Days per week: Not on file    Minutes per session: Not on file  . Stress: Not on file  Relationships  . Social Musician on phone: Not on file    Gets together: Not on file    Attends religious service: Not on file    Active member of club or organization: Not on file    Attends meetings of clubs or organizations: Not on file    Relationship status: Not on file  Other Topics Concern  . Not on file  Social History Narrative  . Not on file     Review of Systems: A 12 point ROS discussed and pertinent positives are indicated in the HPI above.  All other systems are negative.  Review of Systems  Constitutional: Negative for chills and fever.  HENT: Negative for nosebleeds.   Respiratory: Negative for cough and shortness of breath.   Cardiovascular: Negative for chest pain.  Gastrointestinal: Negative for abdominal pain, blood in stool, diarrhea, nausea and vomiting.  Genitourinary: Negative for dysuria and hematuria.  Musculoskeletal: Negative for back pain.  Skin: Negative for rash  and wound.  Neurological: Positive for headaches. Negative for dizziness, tremors, seizures, syncope, facial asymmetry, speech difficulty, weakness, light-headedness and numbness.  Psychiatric/Behavioral: Negative for confusion.    Vital Signs: BP (!) 155/78   Pulse 62   Temp 98.1 F (36.7 C) (Oral)   Resp 18   Ht 5\' 10"  (1.778 m)   Wt 210 lb (95.3 kg)   SpO2 99%   BMI 30.13 kg/m   Physical Exam Vitals signs reviewed.  Constitutional:      General: He is not in acute distress. HENT:     Head: Normocephalic.     Mouth/Throat:     Mouth: Mucous membranes are moist.     Pharynx: Oropharynx is clear. No oropharyngeal exudate or posterior oropharyngeal erythema.  Cardiovascular:  Rate and Rhythm: Normal rate and regular rhythm.     Pulses: Normal pulses.  Pulmonary:     Effort: Pulmonary effort is normal.     Breath sounds: Normal breath sounds.  Abdominal:     General: Bowel sounds are normal. There is no distension.     Palpations: Abdomen is soft.     Tenderness: There is no abdominal tenderness.  Skin:    General: Skin is warm and dry.  Neurological:     Mental Status: He is alert and oriented to person, place, and time.  Psychiatric:        Mood and Affect: Mood normal.        Behavior: Behavior normal.        Thought Content: Thought content normal.        Judgment: Judgment normal.      MD Evaluation Airway: WNL Heart: WNL Abdomen: WNL Chest/ Lungs: WNL ASA  Classification: 2 Mallampati/Airway Score: Two   Imaging: No results found.  Labs:  CBC: Recent Labs    05/21/19 1627 09/14/19 0632  WBC 10.3 7.0  HGB 13.5 13.3  HCT 39.9 39.4  PLT 236 217    COAGS: Recent Labs    05/21/19 1627  INR 1.1  APTT 29    BMP: Recent Labs    05/21/19 1627  NA 139  K 3.6  CL 107  CO2 21*  GLUCOSE 171*  BUN 21  CALCIUM 9.4  CREATININE 1.26*  GFRNONAA 53*  GFRAA >60    LIVER FUNCTION TESTS: Recent Labs    05/21/19 1627  BILITOT 0.8   AST 29  ALT 23  ALKPHOS 63  PROT 7.1  ALBUMIN 4.0    TUMOR MARKERS: No results for input(s): AFPTM, CEA, CA199, CHROMGRNA in the last 8760 hours.  Assessment and Plan:  84 y/o M with history of ICH (05/21/19) followed by Dr. Leonie Man who was noted to have an area of enhancing tubular vessels in the [place] bed concerning for possible AVM vs thrombosed vein on most recent MRI brain (08/13/19). NIR has been asked to perform a diagnostic cerebral angiogram today to further evaluate this area.  Patient has been NPO since 9 pm last night, he continues to take ASA 81 mg QD. Afebrile, WBC 7.0, hgb 13.3, plt 217, creatinine 1.11, INR pending at time of this note writing and will be reviewed prior to procedure.  Risks and benefits of diagnostic cerebral angiogram were discussed with the patient including, but not limited to bleeding, infection, vascular injury, stroke, or contrast induced renal failure. This interventional procedure involves the use of X-rays and because of the nature of the planned procedure, it is possible that we will have prolonged use of X-ray fluoroscopy. Potential radiation risks to you include (but are not limited to) the following: - A slightly elevated risk for cancer  several years later in life. This risk is typically less than 0.5% percent. This risk is low in comparison to the normal incidence of human cancer, which is 33% for women and 50% for men according to the [place]. - Radiation induced injury can include skin redness, resembling a rash, tissue breakdown / ulcers and hair loss (which can be temporary or permanent).  The likelihood of either of these occurring depends on the difficulty of the procedure and whether you are sensitive to radiation due to previous procedures, disease, or genetic conditions.  IF your procedure requires a prolonged use of radiation, you will be notified and  given written instructions for further action.  It is your responsibility  to monitor the irradiated area for the 2 weeks following the procedure and to notify your physician if you are concerned that you have suffered a radiation induced injury.    All of the patient's questions were answered, patient is agreeable to proceed.  Consent signed and in chart.  Thank you for this interesting consult.  I greatly enjoyed meeting GERRALD BASU and look forward to participating in their care.  A copy of this report was sent to the requesting provider on this date.  Electronically Signed: Villa Herb, PA-C 09/14/2019, 7:37 AM   I spent a total of  30 Minutes   in face to face in clinical consultation, greater than 50% of which was counseling/coordinating care for diagnostic cerebral angiogram.

## 2019-09-16 ENCOUNTER — Other Ambulatory Visit (HOSPITAL_COMMUNITY): Payer: Self-pay | Admitting: Neurology

## 2019-09-16 DIAGNOSIS — I611 Nontraumatic intracerebral hemorrhage in hemisphere, cortical: Secondary | ICD-10-CM

## 2019-09-20 ENCOUNTER — Telehealth: Payer: Self-pay | Admitting: Neurology

## 2019-09-20 NOTE — Telephone Encounter (Signed)
Called the patient and reviewed his cerebral angiogram results, Advised there was nothing that indicated a aneurysm to be present. Advised that at his follow up visit in march they will discuss and consider ordering a CTA at that time. Pt verbalized understanding. Pt had no questions at this time but was encouraged to call back if questions arise.

## 2019-09-20 NOTE — Telephone Encounter (Signed)
-----   Message from Darleen Crocker, RN sent at 09/20/2019  4:00 PM EST -----  ----- Message ----- From: Garvin Fila, MD Sent: 09/20/2019   3:34 PM EST To: Marval Regal, RN  Kindly inform the patient that diagnostic cerebral catheter angiogram of the brain does not show any definite aneurysm or abnormal blood vessel which could be the cause of his recent brain hemorrhage.  Will consider doing follow-up CT angiogram in 3 to 4 months.

## 2019-11-11 ENCOUNTER — Other Ambulatory Visit: Payer: Self-pay | Admitting: Cardiovascular Disease

## 2019-11-20 ENCOUNTER — Ambulatory Visit: Payer: Medicare PPO | Attending: Internal Medicine

## 2019-11-20 DIAGNOSIS — Z23 Encounter for immunization: Secondary | ICD-10-CM

## 2019-11-20 NOTE — Progress Notes (Signed)
   Covid-19 Vaccination Clinic  Name:  [name] Marks    MRN: [id number] DOB: 03-24-1935  11/20/2019  [name] was observed post Covid-19 immunization for 15 minutes without incidence. He was provided with Vaccine Information Sheet and instruction to access the V-Safe system.   [name] was instructed to call 911 with any severe reactions post vaccine: Marland Kitchen Difficulty breathing  . Swelling of your face and throat  . A fast heartbeat  . A bad rash all over your body  . Dizziness and weakness    Immunizations Administered    Name Date Dose VIS Date Route   Pfizer COVID-19 Vaccine 11/20/2019  9:03 AM 0.3 mL 09/17/2019 Intramuscular   Manufacturer: [employer], [employer]   Lot: [id number]   NDC: [address]-2

## 2019-12-13 ENCOUNTER — Ambulatory Visit: Payer: Medicare PPO | Attending: Internal Medicine

## 2019-12-13 DIAGNOSIS — Z23 Encounter for immunization: Secondary | ICD-10-CM | POA: Insufficient documentation

## 2019-12-13 NOTE — Progress Notes (Signed)
   Covid-19 Vaccination Clinic  Name:  [name] Marks    MRN: [id number] DOB: [birth year]  12/13/2019  [name] was observed post Covid-19 immunization for 15 minutes without incident. He was provided with Vaccine Information Sheet and instruction to access the V-Safe system.   [name] was instructed to call 911 with any severe reactions post vaccine: Marland Kitchen Difficulty breathing  . Swelling of face and throat  . A fast heartbeat  . A bad rash all over body  . Dizziness and weakness   Immunizations Administered    Name Date Dose VIS Date Route   Pfizer COVID-19 Vaccine 12/13/2019  9:14 AM 0.3 mL 09/17/2019 Intramuscular   Manufacturer: [employer], [employer]   Lot: [id number]   NDC: [address]-5

## 2019-12-17 ENCOUNTER — Other Ambulatory Visit: Payer: Self-pay | Admitting: Cardiovascular Disease

## 2019-12-19 ENCOUNTER — Other Ambulatory Visit: Payer: Self-pay | Admitting: Cardiovascular Disease

## 2019-12-21 ENCOUNTER — Other Ambulatory Visit: Payer: Self-pay | Admitting: Cardiovascular Disease

## 2019-12-21 DIAGNOSIS — I10 Essential (primary) hypertension: Secondary | ICD-10-CM

## 2019-12-21 DIAGNOSIS — E785 Hyperlipidemia, unspecified: Secondary | ICD-10-CM

## 2019-12-21 DIAGNOSIS — I25118 Atherosclerotic heart disease of native coronary artery with other forms of angina pectoris: Secondary | ICD-10-CM

## 2020-01-17 ENCOUNTER — Other Ambulatory Visit (HOSPITAL_COMMUNITY): Payer: Self-pay | Admitting: Interventional Radiology

## 2020-01-17 ENCOUNTER — Telehealth (HOSPITAL_COMMUNITY): Payer: Self-pay

## 2020-01-17 DIAGNOSIS — I611 Nontraumatic intracerebral hemorrhage in hemisphere, cortical: Secondary | ICD-10-CM

## 2020-01-17 NOTE — Telephone Encounter (Signed)
Called to schedule cta head/neck, no answer, left vm. AW 

## 2020-01-17 NOTE — Progress Notes (Signed)
[facility] [address] [place]. [place] [address] [phone]       STROKE FOLLOW UP NOTE  [name] Date of Birth:  1934/10/18 Medical Record Number:  [id number]   Reason for Referral: stroke follow up    SUBJECTIVE:   CHIEF COMPLAINT:  Chief Complaint  Patient presents with  . Follow-up    hx of stroke, treatment rm, with wife    HPI:   [name] Marks is an 85 year old male who is being seen today, 01/18/2020, for follow-up regarding left occipital lobe hemorrhage with adjacent subarachnoid and subdural hemorrhage in 05/2019.  He is accompanied today by his wife.  He notes great improvement of visual impairment with recent evaluation by ophthalmology showing complete recovery.  He does endorse needing to ensure he looks all the way down when changing heights such as on stairs or curbs as he does have very minimal subjective right eye temporal field impairment but otherwise no other visual difficulty.  He has returned back to driving without difficulty and continues to follow with ophthalmology regularly.  He does report occasional right temporal headaches with pulsating quality which have been present since his stroke without worsening and typically relieved by Tylenol. repeat MRI w/wo contrast on 08/11/2019 suggestive of underlying vascular malformation and referred to IR for diagnostic cerebral angiogram for further evaluation.  Underwent cerebral angiogram on 09/14/2019 by Dr. Estanislado Pandy which did not show any definite aneurysm or abnormal blood vessel which could have caused brain hemorrhage and recommended follow-up CTA head/neck in 3 to 4 months for follow-up regarding nonopacification of right transverse sinus and its distal 2/3 which may represent either severe stenosis versus occlusion.  He continues on aspirin 81 mg daily and simvastatin for stroke prevention.  Blood pressure today 152/74.  No further concerns at this time.     Copied from prior  note for reference purposes only Initial visit 07/14/2019 Dr. Leonie Man: [name] Marks is a pleasant 85 year old Caucasian male seen today for initial office follow-up visit following hospital admission for intracerebral hemorrhage in August 2020.  He is accompanied by his daughter.  History is obtained from them and review of electronic medical records and I personally reviewed imaging films in PACS.  [name] Marks is a 85 year old male with past medical history of coronary artery disease, hypertension, hyperlipidemia who presented with sudden onset of right-sided visual field loss on 05/20/2019.  He went to bed sometime the night prior and woke up in the morning at 6 AM and had trouble seeing the table on the right side.  He went to see his eye doctor reported right visual field defect and referred him to the hospital for evaluation.  In route the patient noted he was unable to see the traffic lights on the right side of his field of vision.  He denied any accompanying headache, tingling numbness extremity weakness gait or balance problem.  CT scan of the head on admission showed a left occipital lobe hemorrhage with mild cytotoxic edema and small amounts of adjacent subarachnoid and subdural hemorrhage.  MRI scan of the brain showed unchanged appearance of the intraparenchymal hematoma in the left occipital lobe without any underlying structural or vascular lesion.  CT angiogram of the brain was unremarkable for age without significant large vessel stenosis or occlusion.  2D echo showed normal ejection fraction.  LDL cholesterol 57 mg percent.  Hemoglobin A1c was 5.8.  Patient was initially admitted to the ICU where blood pressure was tightly controlled subsequently was  transferred to the floor.  He was seen by physical occupational and speech therapy.  He was discharged home with instruction not to drive.  He states is done well since discharge and feels his peripheral vision loss is improving.  He in fact  ophthalmologist last week who cleared him to drive.  Patient blood pressures well controlled at home and is never high though today it is elevated in the office at 157/71.  He denies any cognitive difficulties or memory problems and states he is independent in all activities of daily living except he has not started driving yet but would like to do so.  He has no complaints today.  He denies any family history of   Dementia.    ROS:   14 system review of systems performed and negative with exception of visual impairment, headaches  PMH:  Past Medical History:  Diagnosis Date  . Cerebrovascular disease   . COPD (chronic obstructive pulmonary disease) (HCC)   . DJD (degenerative joint disease)   . Hyperlipidemia   . Hypertension   . Peripheral vascular disease (HCC)   . Polyposis of colon   . Subdural hematoma (HCC)    history of     PSH:  Past Surgical History:  Procedure Laterality Date  . CARDIAC CATHETERIZATION  03/21/2010   Patent LAD (coronary artery) stents - No significant obstructive disease in the left circumflex -- Patent RCA with moderately severe ostial stenosis of the posterior descending (coronary) artery branch.  . CARDIAC CATHETERIZATION  08/01/2009   . IR ANGIO INTRA EXTRACRAN SEL COM CAROTID INNOMINATE BILAT MOD SED  09/14/2019  . IR ANGIO VERTEBRAL SEL SUBCLAVIAN INNOMINATE UNI L MOD SED  09/14/2019  . IR ANGIO VERTEBRAL SEL VERTEBRAL UNI R MOD SED  09/14/2019  . IR US GUIDE VASC ACCESS RIGHT  09/14/2019  . LEFT HEART CATHETERIZATION WITH CORONARY ANGIOGRAM N/A 11/08/2013   Procedure: LEFT HEART CATHETERIZATION WITH CORONARY ANGIOGRAM;  Surgeon: Micheline Chapman, MD;  Location: [facility] CATH LAB;  Service: Cardiovascular;  Laterality: N/A;    Social History:  Social History   Socioeconomic History  . Marital status: Married    Spouse name: Not on file  . Number of children: Not on file  . Years of education: Not on file  . Highest education level: Not on file    Occupational History  . Not on file  Tobacco Use  . Smoking status: Former Smoker    Quit date: 09/09/1971    Years since quitting: 48.3  . Smokeless tobacco: Former Neurosurgeon    Types: Chew    Quit date: 09/09/1971  Substance and Sexual Activity  . Alcohol use: Never  . Drug use: Never  . Sexual activity: Not on file  Other Topics Concern  . Not on file  Social History Narrative  . Not on file   Social Determinants of Health   Financial Resource Strain:   . Difficulty of Paying Living Expenses:   Food Insecurity:   . Worried About Programme researcher, broadcasting/film/video in the Last Year:   . Barista in the Last Year:   Transportation Needs:   . Freight forwarder (Medical):   Marland Kitchen Lack of Transportation (Non-Medical):   Physical Activity:   . Days of Exercise per Week:   . Minutes of Exercise per Session:   Stress:   . Feeling of Stress :   Social Connections:   . Frequency of Communication with Friends and Family:   .  Frequency of Social Gatherings with Friends and Family:   . Attends Religious Services:   . Active Member of Clubs or Organizations:   . Attends Banker Meetings:   Marland Kitchen Marital Status:   Intimate Partner Violence:   . Fear of Current or Ex-Partner:   . Emotionally Abused:   Marland Kitchen Physically Abused:   . Sexually Abused:     Family History:  Family History  Problem Relation Age of Onset  . Heart attack Father   . Heart attack Sister   . Breast cancer Sister   . Breast cancer Sister   . Heart disease Brother     Medications:   Current Outpatient Medications on File Prior to Visit  Medication Sig Dispense Refill  . acetaminophen (TYLENOL) 325 MG tablet Take 2 tablets (650 mg total) by mouth every 6 (six) hours as needed for headache.    Marland Kitchen aspirin EC 81 MG tablet Take 1 tablet (81 mg total) by mouth daily. 150 tablet 2  . cholecalciferol (VITAMIN D) 1000 UNITS tablet Take 1,000 Units by mouth 2 (two) times daily.     Marland Kitchen guaiFENesin (MUCINEX) 600 MG 12  hr tablet Take 600 mg by mouth 2 (two) times daily as needed for cough.    . isosorbide mononitrate (IMDUR) 30 MG 24 hr tablet TAKE 1 TABLET(30 MG) BY MOUTH DAILY 90 tablet 2  . loratadine (CLARITIN) 10 MG tablet Take 10 mg by mouth daily as needed for allergies.    Marland Kitchen losartan (COZAAR) 100 MG tablet Take 100 mg by mouth daily.     . metoprolol tartrate (LOPRESSOR) 25 MG tablet TAKE 1 TABLET(25 MG) BY MOUTH TWICE DAILY 180 tablet 3  . Multiple Vitamins-Minerals (PRESERVISION AREDS PO) Take 1 tablet by mouth 2 (two) times daily.     . simvastatin (ZOCOR) 40 MG tablet TAKE 1 TABLET BY MOUTH EVERY NIGHT AT BEDTIME 90 tablet 1   No current facility-administered medications on file prior to visit.    Allergies:  No Known Allergies    OBJECTIVE:  Physical Exam  Vitals:   01/18/20 0842 01/18/20 0906  BP: (!) 152/74 110/68  Pulse: (!) 56   Temp: 97.8 F (36.6 C)   Weight: 221 lb (100.2 kg)   Height: 5\' 10"  (1.778 m)    Body mass index is 31.71 kg/m. No exam data present  General: well developed, well nourished,  pleasant elderly Caucasian male, seated, in no evident distress Head: head normocephalic and atraumatic.   Neck: supple with no carotid or supraclavicular bruits Cardiovascular: regular rate and rhythm, no murmurs Musculoskeletal: no deformity Skin:  no rash/petichiae Vascular:  Normal pulses all extremities   Neurologic Exam Mental Status: Awake and fully alert.   Normal speech and language.  Oriented to place and time. Recent and remote memory intact. Attention span, concentration and fund of knowledge appropriate. Mood and affect appropriate.  Cranial Nerves: Fundoscopic exam reveals sharp disc margins. Pupils equal, briskly reactive to light. Extraocular movements full without nystagmus. Visual fields full to confrontation -unable to appreciate visual loss per confrontation testing. Hearing intact. Facial sensation intact. Face, tongue, palate moves normally and  symmetrically.  Motor: Normal bulk and tone. Normal strength in all tested extremity muscles. Sensory.: intact to touch , pinprick , position and vibratory sensation.  Coordination: Rapid alternating movements normal in all extremities. Finger-to-nose and heel-to-shin performed accurately bilaterally. Gait and Station: Arises from chair without difficulty. Stance is normal. Gait demonstrates normal stride length and balance without  use of assistive device Reflexes: 1+ and symmetric. Toes downgoing.       ASSESSMENT: [name] Marks is a 85 y.o. year old male presented with sudden onset right visual field loss on 05/21/2019 with finding of acute left occipital lobe hemorrhage with small amount of adjacent subarachnoid and subdural hemorrhage secondary to unknown etiology. Vascular risk factors include CAD s/p stents, HTN, HLD and history of stroke/TIA per report though patient denies.  Repeat MRI w/wo contrast possible abnormality therefore proceeded with diagnostic cerebral angiogram on 09/14/2019 which did show non opacification of the right transverse sinus and its distal 2/3 which may represent either severe stenosis versus occlusion and recommended follow-up CTA head/neck 3 months post cerebral angiogram otherwise unremarkable for AVM, dural arteriovenous fistula, aneurysms or arteriovenous shunting.  Recovered well from a stroke standpoint with subjective minimal right inferior temporal visual field deficit noticed greater while ambulating     PLAN:  1. Left occipital lobe hemorrhage:  -Repeat CTA head/neck 01/27/2020 with Dr. Corliss Skains -Right orbital/temporal headaches: Stable since stroke without worsening and possible ongoing improvement.  Recommend continue to monitor and to observe for any aggravating factors prior to headache onset as well as monitoring of blood pressure.  Advised ongoing use of Tylenol at this time but if headaches worsen in frequency or severity to call office for  further evaluation -Continue aspirin 81 mg daily  and simvastatin 40 mg daily for secondary stroke prevention. -Maintain strict control of hypertension with blood pressure goal below 130/90, diabetes with hemoglobin A1c goal below 6.5% and cholesterol with LDL cholesterol (bad cholesterol) goal below 70 mg/dL.  I also advised the patient to eat a healthy diet with plenty of whole grains, cereals, fruits and vegetables, exercise regularly with at least 30 minutes of continuous activity daily and maintain ideal body weight. 2. HTN: Stable.  Continue to follow with PCP for monitoring and management 3. HLD: Continuation of simvastatin and continue to follow with PCP for prescribing, monitoring and management    Follow up in 6 months or call earlier if needed   I spent 26 minutes of face-to-face and non-face-to-face time with patient.  This included previsit chart review, lab review, study review, order entry, electronic health record documentation, patient education regarding recent stroke, residual deficits, importance of managing stroke risk factors and answered all questions to patient satisfaction     Ihor Austin, [facility]  [facility] [address] [place], [place] [address]  Phone [phone] Fax [phone] Note: This document was prepared with digital dictation and possible smart phrase technology. Any transcriptional errors that result from this process are unintentional.

## 2020-01-18 ENCOUNTER — Other Ambulatory Visit: Payer: Self-pay

## 2020-01-18 ENCOUNTER — Ambulatory Visit: Payer: Medicare Other | Admitting: Adult Health

## 2020-01-18 ENCOUNTER — Encounter: Payer: Self-pay | Admitting: Adult Health

## 2020-01-18 VITALS — BP 110/68 | HR 56 | Temp 97.8°F | Ht 70.0 in | Wt 221.0 lb

## 2020-01-18 DIAGNOSIS — G441 Vascular headache, not elsewhere classified: Secondary | ICD-10-CM | POA: Diagnosis not present

## 2020-01-18 DIAGNOSIS — I611 Nontraumatic intracerebral hemorrhage in hemisphere, cortical: Secondary | ICD-10-CM | POA: Diagnosis not present

## 2020-01-18 DIAGNOSIS — E785 Hyperlipidemia, unspecified: Secondary | ICD-10-CM

## 2020-01-18 DIAGNOSIS — I10 Essential (primary) hypertension: Secondary | ICD-10-CM

## 2020-01-18 NOTE — Patient Instructions (Signed)
Continue aspirin 81 mg daily  and simvastatin for secondary stroke prevention  Continue to follow up with PCP regarding cholesterol and blood pressure management   Obtain CT scans next week with Dr. Corliss Skains   Continue monitor headaches - monitor blood pressure with onset, and note any prior activity that occurred prior to starting such as increased exertion, lack of fluids or fatigue - if worsens or you would like to proceed with medication management, please call office to discuss further   Continue to monitor blood pressure at home  Maintain strict control of hypertension with blood pressure goal below 130/90, diabetes with hemoglobin A1c goal below 6.5% and cholesterol with LDL cholesterol (bad cholesterol) goal below 70 mg/dL. I also advised the patient to eat a healthy diet with plenty of whole grains, cereals, fruits and vegetables, exercise regularly and maintain ideal body weight.  Followup in the future with me in 6 months or call earlier if needed       Thank you for coming to see [place] at [facility]. I hope we have been able to provide you high quality care today.  You may receive a patient satisfaction survey over the next few weeks. We would appreciate your feedback and comments so that we may continue to improve ourselves and the health of our patients.

## 2020-01-19 NOTE — Progress Notes (Signed)
I agree with the above plan 

## 2020-01-27 ENCOUNTER — Other Ambulatory Visit: Payer: Self-pay

## 2020-01-27 ENCOUNTER — Ambulatory Visit (HOSPITAL_COMMUNITY)
Admission: RE | Admit: 2020-01-27 | Discharge: 2020-01-27 | Disposition: A | Discharge status: Home / Self Care | Payer: Medicare PPO | Source: Ambulatory Visit | Attending: Interventional Radiology | Admitting: Interventional Radiology

## 2020-01-27 DIAGNOSIS — I611 Nontraumatic intracerebral hemorrhage in hemisphere, cortical: Secondary | ICD-10-CM

## 2020-01-27 LAB — POCT I-STAT CREATININE: Creatinine, Ser: 1.2 mg/dL (ref 0.61–1.24)

## 2020-01-27 MED ORDER — IOHEXOL 350 MG/ML SOLN
75.0000 mL | Freq: Once | INTRAVENOUS | Status: AC | PRN
  Administered 2020-01-27: 75 mL via INTRAVENOUS

## 2020-01-31 ENCOUNTER — Telehealth (HOSPITAL_COMMUNITY): Payer: Self-pay

## 2020-01-31 ENCOUNTER — Encounter: Payer: Self-pay | Admitting: Cardiovascular Disease

## 2020-01-31 ENCOUNTER — Ambulatory Visit: Payer: Medicare PPO | Admitting: Cardiovascular Disease

## 2020-01-31 ENCOUNTER — Other Ambulatory Visit: Payer: Self-pay

## 2020-01-31 VITALS — BP 144/74 | HR 65 | Ht 70.0 in | Wt 219.0 lb

## 2020-01-31 DIAGNOSIS — I25118 Atherosclerotic heart disease of native coronary artery with other forms of angina pectoris: Secondary | ICD-10-CM

## 2020-01-31 DIAGNOSIS — I10 Essential (primary) hypertension: Secondary | ICD-10-CM

## 2020-01-31 DIAGNOSIS — E782 Mixed hyperlipidemia: Secondary | ICD-10-CM

## 2020-01-31 NOTE — Telephone Encounter (Signed)
Called pt regarding recent cta, no answer, left vm. AW 

## 2020-01-31 NOTE — Progress Notes (Signed)
Cardiology Office Note:    Date:  01/31/2020   ID:  [name] Marks, DOB 03/17/1935, MRN [id number]  PCP:  Elizabeth Palau, FNP  Cardiologist:  Tonny Bollman, MD  Electrophysiologist:  None   Referring MD: Elizabeth Palau, FNP   Chief Complaint  Patient presents with  . Coronary Artery Disease    History of Present Illness:    [name] Marks is a 85 y.o. male with a hx of coronary artery disease status post PCI of the LAD, hypertension, and mixed hyperlipidemia, presenting for follow-up evaluation.  Last year the patient had an intracerebral hemorrhage with resultant right homonymous hemianopsia.  He continues to have some issues with headaches and hearing loss.  From a cardiac perspective, he has been doing fine.  He denies chest pain, chest pressure, heart palpitations, orthopnea, or PND.  He has mild shortness of breath with activity which has been chronic and unchanged. The patient is here with his wife today. He's taking his BP readings at home twice per week, this morning's BP is 120/71 mmHg.   Past Medical History:  Diagnosis Date  . Cerebrovascular disease   . COPD (chronic obstructive pulmonary disease) (HCC)   . DJD (degenerative joint disease)   . Hyperlipidemia   . Hypertension   . Peripheral vascular disease (HCC)   . Polyposis of colon   . Subdural hematoma (HCC)    history of     Past Surgical History:  Procedure Laterality Date  . CARDIAC CATHETERIZATION  03/21/2010   Patent LAD (coronary artery) stents - No significant obstructive disease in the left circumflex -- Patent RCA with moderately severe ostial stenosis of the posterior descending (coronary) artery branch.  . CARDIAC CATHETERIZATION  08/01/2009   . IR ANGIO INTRA EXTRACRAN SEL COM CAROTID INNOMINATE BILAT MOD SED  09/14/2019  . IR ANGIO VERTEBRAL SEL SUBCLAVIAN INNOMINATE UNI L MOD SED  09/14/2019  . IR ANGIO VERTEBRAL SEL VERTEBRAL UNI R MOD SED  09/14/2019  . IR US GUIDE VASC ACCESS RIGHT   09/14/2019  . LEFT HEART CATHETERIZATION WITH CORONARY ANGIOGRAM N/A 11/08/2013   Procedure: LEFT HEART CATHETERIZATION WITH CORONARY ANGIOGRAM;  Surgeon: Micheline Chapman, MD;  Location: [facility] CATH LAB;  Service: Cardiovascular;  Laterality: N/A;    Current Medications: Current Meds  Medication Sig  . acetaminophen (TYLENOL) 325 MG tablet Take 2 tablets (650 mg total) by mouth every 6 (six) hours as needed for headache.  Marland Kitchen aspirin EC 81 MG tablet Take 1 tablet (81 mg total) by mouth daily.  . cholecalciferol (VITAMIN D) 1000 UNITS tablet Take 1,000 Units by mouth 2 (two) times daily.   Marland Kitchen guaiFENesin (MUCINEX) 600 MG 12 hr tablet Take 600 mg by mouth 2 (two) times daily as needed for cough.  . isosorbide mononitrate (IMDUR) 30 MG 24 hr tablet TAKE 1 TABLET(30 MG) BY MOUTH DAILY  . loratadine (CLARITIN) 10 MG tablet Take 10 mg by mouth daily as needed for allergies.  Marland Kitchen losartan (COZAAR) 100 MG tablet Take 100 mg by mouth daily.   . metoprolol tartrate (LOPRESSOR) 25 MG tablet TAKE 1 TABLET(25 MG) BY MOUTH TWICE DAILY  . Multiple Vitamins-Minerals (PRESERVISION AREDS PO) Take 1 tablet by mouth 2 (two) times daily.   . simvastatin (ZOCOR) 40 MG tablet TAKE 1 TABLET BY MOUTH EVERY NIGHT AT BEDTIME     Allergies:   Patient has no known allergies.   Social History   Socioeconomic History  . Marital status: Married  Spouse name: Not on file  . Number of children: Not on file  . Years of education: Not on file  . Highest education level: Not on file  Occupational History  . Not on file  Tobacco Use  . Smoking status: Former Smoker    Quit date: 09/09/1971    Years since quitting: 48.4  . Smokeless tobacco: Former Systems developer    Types: [place] date: 09/09/1971  Substance and Sexual Activity  . Alcohol use: Never  . Drug use: Never  . Sexual activity: Not on file  Other Topics Concern  . Not on file  Social History Narrative  . Not on file   Social Determinants of Health   Financial  Resource Strain:   . Difficulty of Paying Living Expenses:   Food Insecurity:   . Worried About Charity fundraiser in the Last Year:   . Arboriculturist in the Last Year:   Transportation Needs:   . Film/video editor (Medical):   Marland Kitchen Lack of Transportation (Non-Medical):   Physical Activity:   . Days of Exercise per Week:   . Minutes of Exercise per Session:   Stress:   . Feeling of Stress :   Social Connections:   . Frequency of Communication with Friends and Family:   . Frequency of Social Gatherings with Friends and Family:   . Attends Religious Services:   . Active Member of Clubs or Organizations:   . Attends Archivist Meetings:   Marland Kitchen Marital Status:      Family History: The patient's family history includes Breast cancer in his sister and sister; Heart attack in his father and sister; Heart disease in his brother.  ROS:   Please see the history of present illness.    All other systems reviewed and are negative.  EKGs/Labs/Other Studies Reviewed:    EKG:  EKG is not ordered today.    Recent Labs: 05/21/2019: ALT 23 09/14/2019: BUN 19; Hemoglobin 13.3; Platelets 217; Potassium 3.9; Sodium 142 01/27/2020: Creatinine, Ser 1.20  Recent Lipid Panel    Component Value Date/Time   CHOL 123 05/21/2019 2045   TRIG 72 05/21/2019 2045   HDL 52 05/21/2019 2045   CHOLHDL 2.4 05/21/2019 2045   VLDL 14 05/21/2019 2045   LDLCALC 57 05/21/2019 2045    Physical Exam:    VS:  BP (!) 144/74   Pulse 65   Ht 5\' 10"  (1.778 m)   Wt 219 lb (99.3 kg)   SpO2 (!) 65%   BMI 31.42 kg/m     Wt Readings from Last 3 Encounters:  01/31/20 219 lb (99.3 kg)  01/18/20 221 lb (100.2 kg)  09/14/19 210 lb (95.3 kg)     GEN:  Well nourished, well developed in no acute distress HEENT: Normal NECK: No JVD; No carotid bruits LYMPHATICS: No lymphadenopathy CARDIAC: RRR, no murmurs, rubs, gallops RESPIRATORY:  Clear to auscultation without rales, wheezing or rhonchi  ABDOMEN:  Soft, non-tender, non-distended MUSCULOSKELETAL:  No edema; No deformity  SKIN: Warm and dry NEUROLOGIC:  Alert and oriented x 3 PSYCHIATRIC:  Normal affect   ASSESSMENT:    1. Coronary artery disease of native artery of native heart with stable angina pectoris ([place])   2. Essential hypertension   3. Mixed hyperlipidemia    PLAN:    In order of problems listed above:  1. The patient is stable on his current medical program.  He remains on low-dose aspirin, isosorbide,  and metoprolol.  No changes are made today. 2. Blood pressure is well controlled based on home readings.  We will continue isosorbide, losartan, and metoprolol. 3. Lipids have been excellent.  Last panel showed an HDL of 52, LDL 57, total cholesterol 850.   Medication Adjustments/Labs and Tests Ordered: Current medicines are reviewed at length with the patient today.  Concerns regarding medicines are outlined above.  No orders of the defined types were placed in this encounter.  No orders of the defined types were placed in this encounter.   Patient Instructions  Medication Instructions:  Your provider recommends that you continue on your current medications as directed. Please refer to the Current Medication list given to you today.   *If you need a refill on your cardiac medications before your next appointment, please call your pharmacy*   Follow-Up: At [facility], you and your health needs are our priority.  As part of our continuing mission to provide you with exceptional heart care, we have created designated Provider Care Teams.  These Care Teams include your primary Cardiologist (physician) and Advanced Practice Providers (APPs -  Physician Assistants and Nurse Practitioners) who all work together to provide you with the care you need, when you need it. Your next appointment:   12 month(s) The format for your next appointment:   In Person Provider:   You may see Tonny Bollman, MD or one of the  following Advanced Practice Providers on your designated Care Team:    Tereso Newcomer, PA-C  Vin Davis, PA-C  [place], [place]      Signed, Tonny Bollman, MD  01/31/2020 9:37 AM    [facility]

## 2020-01-31 NOTE — Patient Instructions (Signed)

## 2020-02-01 ENCOUNTER — Telehealth (HOSPITAL_COMMUNITY): Payer: Self-pay

## 2020-02-01 NOTE — Telephone Encounter (Signed)
Pt agreed to f/u in 6 months with cta head/neck. AW 

## 2020-05-07 DEATH — deceased

## 2020-07-19 ENCOUNTER — Other Ambulatory Visit: Payer: Self-pay

## 2020-07-19 ENCOUNTER — Ambulatory Visit: Payer: Medicare PPO | Admitting: Adult Health

## 2020-07-19 ENCOUNTER — Encounter: Payer: Self-pay | Admitting: Adult Health

## 2020-07-19 VITALS — BP 148/80 | HR 62 | Ht 70.0 in | Wt 215.0 lb

## 2020-07-19 DIAGNOSIS — I10 Essential (primary) hypertension: Secondary | ICD-10-CM | POA: Diagnosis not present

## 2020-07-19 DIAGNOSIS — I611 Nontraumatic intracerebral hemorrhage in hemisphere, cortical: Secondary | ICD-10-CM

## 2020-07-19 DIAGNOSIS — E785 Hyperlipidemia, unspecified: Secondary | ICD-10-CM | POA: Diagnosis not present

## 2020-07-19 NOTE — Patient Instructions (Addendum)
Continue aspirin 81 mg daily  and simvastatin for secondary stroke prevention  Ensure follow-up with Dr. Corliss Skains for repeat imaging and if you do not hear from his office by the end of the month, please call then to schedule recommended follow-up imaging  Continue to follow up with PCP regarding cholesterol and blood pressure management  Maintain strict control of hypertension with blood pressure goal below 130/90 and cholesterol with LDL cholesterol (bad cholesterol) goal below 70 mg/dL.    Overall stable from stroke standpoint and recommend follow-up on an as-needed basis       Thank you for coming to see [place] at [facility]. I hope we have been able to provide you high quality care today.  You may receive a patient satisfaction survey over the next few weeks. We would appreciate your feedback and comments so that we may continue to improve ourselves and the health of our patients.    Intracerebral Hemorrhage  An intracerebral hemorrhage occurs when a blood vessel in the brain leaks or bursts. As a result, that area of the brain becomes damaged from a lack of blood and oxygen and from a pooling of leaked blood that presses on brain tissue. This is also called a bleeding (hemorrhagic) stroke. A stroke is a medical emergency. It must be treated right away. Early and prompt treatment will increase the chances of better recovery. Delay may lead to permanent damage and loss of brain function. What are the causes? This condition is often caused by very high blood pressure. It may also be caused by:  Head injury (trauma).  A bulging of a weak section in a blood vessel (aneurysm).  Thin and hardened blood vessels. This occurs when plaque builds on the walls of an artery.  An abnormal formation of a blood vessel (arteriovenous malformation). This condition results in an abnormal tangle of thin-walled blood vessels.  A tumor in the brain.  Protein buildup on the  artery walls of the brain (amyloid angiopathy). Sometimes the cause of this condition is not known. What increases the risk? You are more likely to develop this condition if:  You have high blood pressure (hypertension).  You are an older adult.  You abuse alcohol or drugs.  You take blood-thinning medicines (anticoagulants). What are the signs or symptoms? Symptoms of this condition include:  Sudden onset of these problems: ? Weakness or numbness of the face, arm, or leg, especially on one side of the body. ? Nausea and vomiting. ? Trouble speaking (aphasia) or understanding speech. ? Trouble seeing in one or both eyes. ? Difficulty walking or difficulty moving arms or legs. ? Loss of balance or coordination. ? Confusion. ? Dizziness.  Seizures.  Hypertension. How is this diagnosed? This condition may be diagnosed based on your medical history and physical exam. You may also have other tests, including:  CT scan or MRI to view the brain.  Computed tomography angiography (CTA) or a magnetic resonance angiogram (MRA) to view vessels in the brain. How is this treated? The goals of treating this condition are to stop the bleeding, control pressure in the brain, and relieve symptoms. Treatment may include:  Medicines to treat symptoms, such as hypertension, pain, and nausea and vomiting.  Other medicines, blood products, or vitamin K to control the bleeding.  A tube (shunt) in the brain to relieve pressure.  Assisted breathing (ventilation).  Surgery to stop bleeding, remove a blood clot or tumor, and reduce pressure. Surgeries may include: ? Craniotomy,  to remove a part of the skull in order to reduce pressure in the brain. ? Stereotactic aspiration, to remove blood from the brain using a needle or syringe. Follow these instructions at home: Medicines  Take over-the-counter and prescription medicines only as told by your health care provider.  Do not take medicines  such as aspirin and ibuprofen unless your health care provider tells you to take them. These medicines can thin your blood and increase the risk of bleeding. Eating and drinking  Eat healthy foods as directed by your health care provider. You may be asked to: ? Eat a diet that is low in salt (sodium), saturated fat, trans fat, and cholesterol to manage your blood pressure.  Seek the help of specialists if your ability to swallow safely has been affected. A dietitian, speech and language specialists, and an occupational therapist can teach you how to get the nutrition you need.  Limit alcohol intake to no more than 1 drink a day for nonpregnant women and 2 drinks a day for men. One drink equals 12 oz of beer, 5 oz of wine, or 1 oz of hard liquor. Lifestyle  Do not use any products that contain nicotine or tobacco, such as cigarettes and e-cigarettes. If you need help quitting, ask your health care provider.  Follow your health care provider's instructions about preventing falls. Your health care provider may: ? Arrange for specialists to evaluate your home. ? Recommend that you install grab bars in the bedroom and bathroom. ? Arrange for special equipment to be used at home, such as a raised toilet and a seat for the shower.  Use a walker or a cane at all times if directed by your health care provider.  Do physical activities as told by your health care provider. Ask your health care provider what activities are safe for you. General instructions  Manage any other health conditions you may have, including high blood pressure, diabetes, and excess body weight.  Keep all follow-up visits as told by your health care provider. This is important. Visits include referrals for physical and occupational therapy, rehabilitation, and lab tests. Get help right away if:   You have any symptoms of a stroke. "BE FAST" is an easy way to remember the main warning signs of a stroke: ? B - Balance. Signs  are dizziness, sudden trouble walking, or loss of balance. ? E - Eyes. Signs are trouble seeing or a sudden change in vision. ? F - Face. Signs are sudden weakness or numbness of the face, or the face or eyelid drooping on one side. ? A - Arms. Signs are weakness or numbness in an arm. This happens suddenly and usually on one side of the body. ? S - Speech. Signs are sudden trouble speaking, slurred speech, or trouble understanding what people say. ? T - Time. Time to call emergency services. Write down what time symptoms started.  You have other signs of a stroke, such as: ? A sudden, severe headache with no known cause. ? Nausea or vomiting. ? Seizure. ? You have a partial or total loss of consciousness. ? Confusion. These symptoms may represent a serious problem that is an emergency. Do not wait to see if the symptoms will go away. Get medical help right away. Call your local emergency services (911 in the [place] Do not drive yourself to the hospital. Summary  An intracerebral hemorrhage occurs when a blood vessel in the brain leaks or bursts.  This condition is  often caused by very high blood pressure.  Early and prompt treatment will increase the chances of better recovery. Delay may lead to permanent damage and loss of brain function.  The goals of treatment are to stop the bleeding, control pressure in the brain, and relieve symptoms. This information is not intended to replace advice given to you by your health care provider. Make sure you discuss any questions you have with your health care provider. Document Revised: 02/26/2019 Document Reviewed: 08/27/2017 Elsevier Patient Education  2020 [employer].

## 2020-07-19 NOTE — Progress Notes (Signed)
I agree with the above plan 

## 2020-07-19 NOTE — Progress Notes (Signed)
[facility] [address] [place]. [place] [address] [phone]       STROKE FOLLOW UP NOTE  [name] Date of Birth:  [birth year] Medical Record Number:  [id number]   Reason for Referral: stroke follow up    SUBJECTIVE:   CHIEF COMPLAINT:  Chief Complaint  Patient presents with   Follow-up    rm 9   Cerebrovascular Accident    Pt is having no sx or concerns.    HPI:   Today, 07/19/2020, [name] Marks returns for 61-month follow-up accompanied by his wife.  Stable from stroke standpoint with residual visual impairment but does report improvement.  He continues to follow routinely with ophthalmology and has follow-up visit in February.  Denies new or worsening stroke/TIA symptoms.  He remains active performing ADLs and IADLs independently.  Reports continued occasional headaches but typically resolve with Tylenol.  Remains on aspirin 81 mg daily and simvastatin for secondary stroke prevention without side effects.  Blood pressure today 148/85 monitors at home and typically SBP 130-150 continuing on Imdur, losartan and metoprolol.  No concerns at this time.    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  He is accompanied today by his wife.  He notes great improvement of visual impairment with recent evaluation by ophthalmology showing complete recovery.  He does endorse needing to ensure he looks all the way down when changing heights such as on stairs or curbs as he does have very minimal subjective right eye temporal field impairment but otherwise no other visual difficulty.  He has returned back to driving without difficulty and continues to follow with ophthalmology regularly.  He does report occasional right temporal headaches with pulsating  quality which have been present since his stroke without worsening and typically relieved by Tylenol. repeat MRI w/wo contrast on 08/11/2019 suggestive of underlying vascular malformation and referred to IR for diagnostic cerebral angiogram for further evaluation.  Underwent cerebral angiogram on 09/14/2019 by Dr. Corliss Skains which did not show any definite aneurysm or abnormal blood vessel which could have caused brain hemorrhage and recommended follow-up CTA head/neck in 3 to 4 months for follow-up regarding nonopacification of right transverse sinus and its distal 2/3 which may represent either severe stenosis versus occlusion.  He continues on aspirin 81 mg daily and simvastatin for stroke prevention.  Blood pressure today 152/74.  No further concerns at this time.  Initial visit 07/14/2019 Dr. Pearlean Brownie: [name] Marks is a pleasant 85 year old Caucasian male seen today for initial office follow-up visit following hospital admission for intracerebral hemorrhage in August 2020.  He is accompanied by his daughter.  History is obtained from them and review of electronic medical records and I personally reviewed imaging films in PACS.  [name] Marks is a 85 year old male with past medical history of coronary artery disease, hypertension, hyperlipidemia who presented with sudden onset of right-sided visual field loss on 05/20/2019.  He went to bed sometime the night prior and woke up in the morning at 6 AM and had trouble seeing the table on the right side.  He went to see his eye doctor reported right visual field defect and referred him to the hospital for evaluation.  In route the patient noted he was unable to see the traffic lights on the right side of his field of vision.  He  denied any accompanying headache, tingling numbness extremity weakness gait or balance problem.  CT scan of the head on admission showed a left occipital lobe hemorrhage with mild cytotoxic edema and small amounts of adjacent subarachnoid and  subdural hemorrhage.  MRI scan of the brain showed unchanged appearance of the intraparenchymal hematoma in the left occipital lobe without any underlying structural or vascular lesion.  CT angiogram of the brain was unremarkable for age without significant large vessel stenosis or occlusion.  2D echo showed normal ejection fraction.  LDL cholesterol 57 mg percent.  Hemoglobin A1c was 5.8.  Patient was initially admitted to the ICU where blood pressure was tightly controlled subsequently was transferred to the floor.  He was seen by physical occupational and speech therapy.  He was discharged home with instruction not to drive.  He states is done well since discharge and feels his peripheral vision loss is improving.  He in fact ophthalmologist last week who cleared him to drive.  Patient blood pressures well controlled at home and is never high though today it is elevated in the office at 157/71.  He denies any cognitive difficulties or memory problems and states he is independent in all activities of daily living except he has not started driving yet but would like to do so.  He has no complaints today.  He denies any family history of   Dementia.    ROS:   14 system review of systems performed and negative with exception of visual impairment, headaches  PMH:  Past Medical History:  Diagnosis Date   Cerebrovascular disease    COPD (chronic obstructive pulmonary disease) (HCC)    DJD (degenerative joint disease)    Hyperlipidemia    Hypertension    Peripheral vascular disease (HCC)    Polyposis of colon    Subdural hematoma (HCC)    history of     PSH:  Past Surgical History:  Procedure Laterality Date   CARDIAC CATHETERIZATION  03/21/2010   Patent LAD (coronary artery) stents - No significant obstructive disease in the left circumflex -- Patent RCA with moderately severe ostial stenosis of the posterior descending (coronary) artery branch.   CARDIAC CATHETERIZATION  08/01/2009      IR ANGIO INTRA EXTRACRAN SEL COM CAROTID INNOMINATE BILAT MOD SED  09/14/2019   IR ANGIO VERTEBRAL SEL SUBCLAVIAN INNOMINATE UNI L MOD SED  09/14/2019   IR ANGIO VERTEBRAL SEL VERTEBRAL UNI R MOD SED  09/14/2019   IR US GUIDE VASC ACCESS RIGHT  09/14/2019   LEFT HEART CATHETERIZATION WITH CORONARY ANGIOGRAM N/A 11/08/2013   Procedure: LEFT HEART CATHETERIZATION WITH CORONARY ANGIOGRAM;  Surgeon: Micheline Chapman, MD;  Location: [facility] CATH LAB;  Service: Cardiovascular;  Laterality: N/A;    Social History:  Social History   Socioeconomic History   Marital status: Married    Spouse name: Not on file   Number of children: Not on file   Years of education: Not on file   Highest education level: Not on file  Occupational History   Not on file  Tobacco Use   Smoking status: Former Smoker    Quit date: 09/09/1971    Years since quitting: 48.8   Smokeless tobacco: Former Neurosurgeon    Types: Chew    Quit date: 09/09/1971  Vaping Use   Vaping Use: Never used  Substance and Sexual Activity   Alcohol use: Never   Drug use: Never   Sexual activity: Not on file  Other Topics Concern  Not on file  Social History Narrative   Not on file   Social Determinants of Health   Financial Resource Strain:    Difficulty of Paying Living Expenses: Not on file  Food Insecurity:    Worried About Running Out of Food in the Last Year: Not on file   Ran Out of Food in the Last Year: Not on file  Transportation Needs:    Lack of Transportation (Medical): Not on file   Lack of Transportation (Non-Medical): Not on file  Physical Activity:    Days of Exercise per Week: Not on file   Minutes of Exercise per Session: Not on file  Stress:    Feeling of Stress : Not on file  Social Connections:    Frequency of Communication with Friends and Family: Not on file   Frequency of Social Gatherings with Friends and Family: Not on file   Attends Religious Services: Not on file   Active  Member of Clubs or Organizations: Not on file   Attends Banker Meetings: Not on file   Marital Status: Not on file  Intimate Partner Violence:    Fear of Current or Ex-Partner: Not on file   Emotionally Abused: Not on file   Physically Abused: Not on file   Sexually Abused: Not on file    Family History:  Family History  Problem Relation Age of Onset   Heart attack Father    Heart attack Sister    Breast cancer Sister    Breast cancer Sister    Heart disease Brother     Medications:   Current Outpatient Medications on File Prior to Visit  Medication Sig Dispense Refill   acetaminophen (TYLENOL) 325 MG tablet Take 2 tablets (650 mg total) by mouth every 6 (six) hours as needed for headache.     aspirin EC 81 MG tablet Take 81 mg by mouth daily. Swallow whole.     cholecalciferol (VITAMIN D) 1000 UNITS tablet Take 1,000 Units by mouth 2 (two) times daily.      guaiFENesin (MUCINEX) 600 MG 12 hr tablet Take 600 mg by mouth 2 (two) times daily as needed for cough.     isosorbide mononitrate (IMDUR) 30 MG 24 hr tablet TAKE 1 TABLET(30 MG) BY MOUTH DAILY 90 tablet 2   loratadine (CLARITIN) 10 MG tablet Take 10 mg by mouth daily as needed for allergies.     losartan (COZAAR) 100 MG tablet Take 100 mg by mouth daily.      metoprolol tartrate (LOPRESSOR) 25 MG tablet TAKE 1 TABLET(25 MG) BY MOUTH TWICE DAILY 180 tablet 3   Multiple Vitamins-Minerals (PRESERVISION AREDS PO) Take 1 tablet by mouth 2 (two) times daily.      simvastatin (ZOCOR) 40 MG tablet TAKE 1 TABLET BY MOUTH EVERY NIGHT AT BEDTIME 90 tablet 1   No current facility-administered medications on file prior to visit.    Allergies:  No Known Allergies    OBJECTIVE:  Physical Exam  Vitals:   07/19/20 0818  BP: (!) 148/80  Pulse: 62  Weight: 215 lb (97.5 kg)  Height: 5\' 10"  (1.778 m)   Body mass index is 30.85 kg/m. No exam data present  General: well developed, well  nourished,  pleasant elderly Caucasian male, seated, in no evident distress Head: head normocephalic and atraumatic.   Neck: supple with no carotid or supraclavicular bruits Cardiovascular: regular rate and rhythm, no murmurs Musculoskeletal: no deformity Skin:  no rash/petichiae Vascular:  Normal pulses  all extremities   Neurologic Exam Mental Status: Awake and fully alert.   Fluent speech and language.  Oriented to place and time. Recent and remote memory intact. Attention span, concentration and fund of knowledge appropriate. Mood and affect appropriate.  Cranial Nerves: Pupils equal, briskly reactive to light. Extraocular movements full without nystagmus. Visual fields full to confrontation -unable to appreciate visual loss per confrontation testing. Hearing intact. Facial sensation intact. Face, tongue, palate moves normally and symmetrically.  Motor: Normal bulk and tone. Normal strength in all tested extremity muscles. Sensory.: intact to touch , pinprick , position and vibratory sensation.  Coordination: Rapid alternating movements normal in all extremities. Finger-to-nose and heel-to-shin performed accurately bilaterally. Gait and Station: Arises from chair without difficulty. Stance is normal. Gait demonstrates normal stride length and balance without use of assistive device.  Able to tandem walk and heel toe without difficulty Reflexes: 1+ and symmetric. Toes downgoing.       ASSESSMENT: [name] Marks is a [age] y.o. year old male presented with sudden onset right visual field loss on 05/21/2019 with finding of acute left occipital lobe hemorrhage with small amount of adjacent subarachnoid and subdural hemorrhage secondary to unknown etiology. Vascular risk factors include CAD s/p stents, HTN, HLD and history of stroke/TIA per report though patient denies.  Repeat MRI w/wo contrast possible abnormality therefore proceeded with diagnostic cerebral angiogram on 09/14/2019 which did show  non opacification of the right transverse sinus and its distal 2/3 which may represent either severe stenosis versus occlusion and recommended follow-up CTA head/neck 3 months post cerebral angiogram otherwise unremarkable for AVM, dural arteriovenous fistula, aneurysms or arteriovenous shunting.      PLAN:  1. Left occipital lobe hemorrhage:  a. Residual deficits: Subjective right inferior temporal visual field deficit -unable to appreciate on confrontation testing.  Has been stable without worsening and routinely followed by ophthalmology b. Unknown etiology.  CTA head/neck 01/27/2020 no intracranial arterial occlusion or high-grade stenosis.  Dr. Corliss Skainseveshwar recommended repeating CTA head/neck in 6 months post prior imaging c. Continue aspirin 81 mg daily  and simvastatin 40 mg daily for secondary stroke prevention. d. Discussed secondary stroke prevention measures and importance of close PCP follow-up for aggressive stroke risk factor management 2. HTN: BP goal<130/90.  Slightly elevated today but stable at home. On Metroprolol, losartan and Imdur per PCP 3. HLD: LDL goal<70.  On simvastatin per PCP.  Has follow-up with PCP next month and plans on repeat lab work.   Overall stable from stroke standpoint and recommend follow-up on an as-needed basis   I spent 25 minutes of face-to-face and non-face-to-face time with patient and wife.  This included previsit chart review, lab review, study review, order entry, electronic health record documentation, patient education regarding hemorrhagic stroke, residual deficits, importance of managing stroke risk factors and answered all questions to patient satisfaction   Ihor AustinJessica McCue, [facility] IncGNP-BC  [facility] [address] [place], [place] [id number]  Phone [phone] Fax [phone] Note: This document was prepared with digital dictation and possible smart phrase technology. Any transcriptional errors that result from  this process are unintentional.

## 2020-08-01 ENCOUNTER — Other Ambulatory Visit (HOSPITAL_COMMUNITY): Payer: Self-pay | Admitting: Interventional Radiology

## 2020-08-01 ENCOUNTER — Telehealth (HOSPITAL_COMMUNITY): Payer: Self-pay | Admitting: Radiology

## 2020-08-01 DIAGNOSIS — I611 Nontraumatic intracerebral hemorrhage in hemisphere, cortical: Secondary | ICD-10-CM

## 2020-08-01 NOTE — Telephone Encounter (Signed)
Called pt, left VM for him to call to schedule CTA head/neck for follow-up with Deveshwar. JM

## 2020-08-05 ENCOUNTER — Other Ambulatory Visit: Payer: Self-pay | Admitting: Cardiovascular Disease

## 2020-08-17 ENCOUNTER — Ambulatory Visit (HOSPITAL_COMMUNITY): Payer: Medicare PPO

## 2020-08-17 ENCOUNTER — Other Ambulatory Visit: Payer: Self-pay

## 2020-08-17 ENCOUNTER — Ambulatory Visit (HOSPITAL_COMMUNITY)
Admission: RE | Admit: 2020-08-17 | Discharge: 2020-08-17 | Disposition: A | Discharge status: Home / Self Care | Payer: Medicare PPO | Source: Ambulatory Visit | Attending: Interventional Radiology | Admitting: Interventional Radiology

## 2020-08-17 DIAGNOSIS — I611 Nontraumatic intracerebral hemorrhage in hemisphere, cortical: Secondary | ICD-10-CM

## 2020-08-17 LAB — POCT I-STAT CREATININE: Creatinine, Ser: 1.2 mg/dL (ref 0.61–1.24)

## 2020-08-17 MED ORDER — IOHEXOL 350 MG/ML SOLN
80.0000 mL | Freq: Once | INTRAVENOUS | Status: AC | PRN
  Administered 2020-08-17: 80 mL via INTRAVENOUS

## 2020-08-23 ENCOUNTER — Telehealth (HOSPITAL_COMMUNITY): Payer: Self-pay | Admitting: Radiology

## 2020-08-23 NOTE — Telephone Encounter (Signed)
Called pt, left VM that next f/u with Deveshwar will be in 6 months w/ CTA head/neck. JM

## 2021-01-29 NOTE — Progress Notes (Addendum)
Cardiology Office Note:    Date:  01/30/2021   ID:  [name] Marks, DOB 1935-01-29, MRN [id number]  PCP:  Vicenta Aly, [facility]  Cardiologist:  Sherren Mocha, MD   Advanced Practice Provider:  Liliane Shi, PA-C Electrophysiologist:  None       Referring MD: Vicenta Aly, FNP   Chief Complaint:  Follow-up (CAD)    Patient Profile:    [name] Marks is a 86 y.o. male with:   Coronary artery disease  ? S/p PCI of the LAD ? 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 No intracranial large or medium vessel occlusion or correctable proximal stenosis. No sign of aneurysm or vascular malformation with particular attention to the left occipital region. 2. Atherosclerotic disease at both carotid bifurcations but without stenosis or irregularity. 3. 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 Pronounced chronic small-vessel ischemic changes affecting the cerebral hemispheric white matter elsewhere.  Echocardiogram 05/23/2019 EF 60-65, Gr 1 DD, normal RVSF  Stress Echocardiogram 07/12/15 No exercise induced ischemic wall motion abnormalities detected. 1 mm horizontal ST depression noted in the lateral leads with exercise. Exercise tolerance was fair and there was no chest pain. Negative stress echo for ischemia.  Cardiac catheterization 11/08/13 LM irregs LAD prox stent patient with 40-50 at prox edge of stent, mid stent patent with 50 ISR   - FFR: 0.79 LCx patent RCA patent; PAVB ost 60-70 EF 55-65 1. Mild to moderate diffuse LAD stenosis with borderline positive pressure wire analysis 2. Patency of the left circumflex and  right coronary arteries was stable ostial stenosis of the right posterior AV segment 3. Normal LV function Recommendations:Continued medical therapy.    History of Present Illness: [name] Marks was last seen by Dr. Burt Marks in 4/21.  He returns for f/u.  He is here with his wife.  He has had some issues with kidney stones recently and has undergone lithotripsy.  Over the past few weeks, he has noted issues with fatigue and daytime sleepiness.  He has not had syncope.  He has not had anginal symptoms. He has had some chest discomfort at night after meals that is relieved by taking antacids.  He has not had exertional symptoms.  He does note some shortness of breath with activity as well as a cough that is non-productive.  He has not had orthopnea, leg edema.  He has lost about 15 lbs in the last few months.  He has not had any bleeding issues.  He has had some dizziness described as spinning when he stands.  He has chronic HAs since his intracranial bleed in 2020.  These are no worse but may be a little more frequent.  He has not had speech difficulty or unilateral weakness.  He still has some visual deficits from his bleed but there have been no changes.     Past Medical History:  Diagnosis Date  . Cerebrovascular disease   . COPD (chronic obstructive pulmonary disease) ([place])   . DJD (degenerative joint disease)   . Hyperlipidemia   . Hypertension   . Peripheral vascular disease ([place])   . Polyposis of colon   .  Subdural hematoma (H[facility])    history of     Current Medications: Current Meds  Medication Sig  . acetaminophen (TYLENOL) 325 MG tablet Take 2 tablets (650 mg total) by mouth every 6 (six) hours as needed for headache.  Marland Kitchen aspirin EC 81 MG tablet Take 81 mg by mouth daily. Swallow whole.  . cholecalciferol (VITAMIN D) 1000 UNITS tablet Take 1,000 Units by mouth 2 (two) times daily.   Marland Kitchen guaiFENesin (MUCINEX) 600 MG 12 hr tablet Take 600 mg by mouth 2 (two) times daily as needed for  cough.  . isosorbide mononitrate (IMDUR) 30 MG 24 hr tablet TAKE 1 TABLET(30 MG) BY MOUTH DAILY  . loratadine (CLARITIN) 10 MG tablet Take 10 mg by mouth daily as needed for allergies.  Marland Kitchen losartan (COZAAR) 100 MG tablet Take 100 mg by mouth daily.   . metoprolol tartrate (LOPRESSOR) 25 MG tablet TAKE 1 TABLET(25 MG) BY MOUTH TWICE DAILY  . Multiple Vitamins-Minerals (PRESERVISION AREDS PO) Take 1 tablet by mouth 2 (two) times daily.   . simvastatin (ZOCOR) 40 MG tablet TAKE 1 TABLET BY MOUTH EVERY NIGHT AT BEDTIME     Allergies:   Patient has no known allergies.   Social History   Tobacco Use  . Smoking status: Former Smoker    Quit date: 09/09/1971    Years since quitting: 49.4  . Smokeless tobacco: Former Systems developer    Types: [place] date: 09/09/1971  Vaping Use  . Vaping Use: Never used  Substance Use Topics  . Alcohol use: Never  . Drug use: Never     Family Hx: The patient's family history includes Breast cancer in his sister and sister; Heart attack in his father and sister; Heart disease in his brother.  Review of Systems  Constitutional: Positive for malaise/fatigue and weight loss (15 lbs). Negative for chills and fever.  Cardiovascular: Negative for syncope.  Respiratory: Positive for cough, shortness of breath and snoring. Negative for sputum production.   Gastrointestinal: Negative for diarrhea, hematochezia, melena and vomiting.  Genitourinary: Negative for hematuria.  Neurological: Positive for excessive daytime sleepiness, dizziness and vertigo.  All other systems reviewed and are negative.    EKGs/Labs/Other Test Reviewed:    EKG:  EKG is   ordered today.  The ekg ordered today demonstrates normal sinus rhythm, HR 62, normal axis, no ST-TW changes, QTc 410 ms, no change from prior tracing   Recent Labs: 08/17/2020: Creatinine, Ser 1.20   Recent Lipid Panel Lab Results  Component Value Date/Time   CHOL 123 05/21/2019 08:45 PM   TRIG 72 05/21/2019 08:45  PM   HDL 52 05/21/2019 08:45 PM   CHOLHDL 2.4 05/21/2019 08:45 PM   LDLCALC 57 05/21/2019 08:45 PM    Labs obtained through [place] - personally reviewed and interpreted: 12/15/20: Hgb 13.8, K 4.5, SCr 1.36, ALT 19 08/25/20: LDL 73, HDL 62, TG 88, TC 151 07/29/18: TSH 2.59   Risk Assessment/Calculations:      Physical Exam:    VS:  Ht 5' 10" (1.778 m)   Wt 203 lb 4.8 oz (92.2 kg)   SpO2 97%   BMI 29.17 kg/m     Orthostatic VS for the past 24 hrs (Last 3 readings):  BP- Lying Pulse- Lying BP- Sitting Pulse- Sitting BP- Standing at 0 minutes Pulse- Standing at 0 minutes BP- Standing at 3 minutes Pulse- Standing at 3 minutes  01/30/21 1014 127/76 59 129/73 61 120/72 64 139/81 66  Wt Readings from Last 3 Encounters:  01/30/21 203 lb 4.8 oz (92.2 kg)  07/19/20 215 lb (97.5 kg)  01/31/20 219 lb (99.3 kg)     Constitutional:      Appearance: Healthy appearance. Not in distress.  Neck:     Thyroid: No thyromegaly.     Vascular: No JVR. JVD normal.     Lymphadenopathy: No cervical adenopathy.  Lymphadenopathy:      Upper Body:     Right upper body: No axillary adenopathy.     Left upper body: No axillary adenopathy.     Lower Body: No right inguinal adenopathy. No left inguinal adenopathy.  Pulmonary:     Effort: Pulmonary effort is normal.     Breath sounds: No wheezing. No rales.  Chest:  Breasts:     Right: No axillary adenopathy.     Left: No axillary adenopathy.    Cardiovascular:     Normal rate. Regular rhythm. Normal S1. Normal S2.     Murmurs: There is no murmur.  Edema:    Peripheral edema absent.  Abdominal:     Palpations: Abdomen is soft. There is no hepatomegaly.  Skin:    General: Skin is warm and dry.  Neurological:     General: No focal deficit present.     Mental Status: Alert and oriented to person, place and time.     Cranial Nerves: Cranial nerves are intact.         ASSESSMENT & PLAN:    1. Other fatigue 2. Snoring 3.  Cough 4. Weight loss Etiology of his symptoms are not entirely clear.  He has noted daytime hypersomnolence for 2 weeks.  He has lost about 15 lbs in the last few mos without dieting.  He has had a non-productive cough.  It is not clear if all these symptoms are related.  He also has some dizziness with standing.  He has had chronic HAs since his intracranial hemorrhage in 2020.  These are stable.  I will obtain some labs, CXR and an echocardiogram to initiate the w/u.  I am not sure if any of his symptoms could be related to his prior hemorrhage.  At this point, I don't think he needs another head CT.  But, I think it would be good to get back and see neuro back sooner.  -Labs: CMET, CBC, TSH, BNP  -CXR  -Echocardiogram   -Arrange home sleep study   -F/u with Dr. Burt Marks or me in 6-8 weeks   5. Coronary artery disease of native artery of native heart with stable angina pectoris ([place]) History of prior LAD stenting.  He had moderate disease in the LAD by cardiac catheterization in 2015.  Medical therapy was recommended secondary to the diffuse nature of disease.  He has had some symptoms of indigestion relieved with antacids.  He can try OTC famotidine.  If he develops exertional symptoms or symptoms reminiscent of his prior angina, we will need to consider stress testing.  Continue aspirin, isosorbide, metoprolol tartrate, simvastatin.  6. Essential hypertension The patient's blood pressure is controlled on his current regimen.  Continue current therapy.   7. Mixed hyperlipidemia LDL close to goal on most recent labs.  Continue current dose of simvastatin.  8. Dizziness Orthostatic VS today are normal.  I do not think we need to reduce his meds at this time.  I do think it would be good to get back and see neuro sooner.  We contacted neuro today.  F/u w/ Dr. Leonie Man 5/18 at 3:30 PM    Dispo:  Return in about 8 weeks (around 03/27/2021) for Routine follow up with Richardson Dopp, PA in 6-8 weeks. .    Medication Adjustments/Labs and Tests Ordered: Current medicines are reviewed at length with the patient today.  Concerns regarding medicines are outlined above.  Tests Ordered: Orders Placed This Encounter  Procedures  . DG Chest 2 View  . Comp Met (CMET)  . Pro b natriuretic peptide  . CBC  . TSH  . Ambulatory referral to Sleep Studies  . EKG 12-Lead  . ECHOCARDIOGRAM COMPLETE  . [place] Sleep Study   Medication Changes: No orders of the defined types were placed in this encounter.   Signed, Richardson Dopp, PA-C  01/30/2021 11:53 AM    [facility] [address] [place], [place], [place]  [address] Phone: [phone]; Fax: [phone]

## 2021-01-30 ENCOUNTER — Ambulatory Visit
Admission: RE | Admit: 2021-01-30 | Discharge: 2021-01-30 | Disposition: A | Discharge status: Home / Self Care | Payer: Medicare PPO | Source: Ambulatory Visit | Attending: Physician Assistant | Admitting: Physician Assistant

## 2021-01-30 ENCOUNTER — Other Ambulatory Visit: Payer: Self-pay

## 2021-01-30 ENCOUNTER — Encounter: Payer: Self-pay | Admitting: Physician Assistant

## 2021-01-30 ENCOUNTER — Other Ambulatory Visit: Payer: Self-pay | Admitting: *Deleted

## 2021-01-30 ENCOUNTER — Ambulatory Visit: Payer: Medicare PPO | Admitting: Physician Assistant

## 2021-01-30 VITALS — Ht 70.0 in | Wt 203.3 lb

## 2021-01-30 DIAGNOSIS — I25118 Atherosclerotic heart disease of native coronary artery with other forms of angina pectoris: Secondary | ICD-10-CM | POA: Diagnosis not present

## 2021-01-30 DIAGNOSIS — I10 Essential (primary) hypertension: Secondary | ICD-10-CM

## 2021-01-30 DIAGNOSIS — R0683 Snoring: Secondary | ICD-10-CM | POA: Diagnosis not present

## 2021-01-30 DIAGNOSIS — R634 Abnormal weight loss: Secondary | ICD-10-CM

## 2021-01-30 DIAGNOSIS — E782 Mixed hyperlipidemia: Secondary | ICD-10-CM

## 2021-01-30 DIAGNOSIS — R059 Cough, unspecified: Secondary | ICD-10-CM | POA: Diagnosis not present

## 2021-01-30 DIAGNOSIS — R5383 Other fatigue: Secondary | ICD-10-CM

## 2021-01-30 DIAGNOSIS — R42 Dizziness and giddiness: Secondary | ICD-10-CM

## 2021-01-30 NOTE — Patient Instructions (Signed)
Medication Instructions:  Your physician has recommended you make the following change in your medication:   1.  You can try Pepcid OTC one tablet by mouth ( 20 mg) twice daily.    *If you need a refill on your cardiac medications before your next appointment, please call your pharmacy*   Lab Work: Your physician recommends that you have lab work today: CMET/PRO BNP/CBC/TSH  If you have labs (blood work) drawn today and your tests are completely normal, you will receive your results only by: Marland Kitchen MyChart Message (if you have MyChart) OR . A paper copy in the mail If you have any lab test that is abnormal or we need to change your treatment, we will call you to review the results.   Testing/Procedures: Your physician has requested that you have an echocardiogram.Thursday, May 26 @ 10:35 am.  Echocardiography is a painless test that uses sound waves to create images of your heart. It provides your doctor with information about the size and shape of your heart and how well your heart's chambers and valves are working. This procedure takes approximately one hour. There are no restrictions for this procedure.   Echocardiogram An echocardiogram is a test that uses sound waves (ultrasound) to produce images of the heart. Images from an echocardiogram can provide important information about:  Heart size and shape.  The size and thickness and movement of your heart's walls.  Heart muscle function and strength.  Heart valve function or if you have stenosis. Stenosis is when the heart valves are too narrow.  If blood is flowing backward through the heart valves (regurgitation).  A tumor or infectious growth around the heart valves.  Areas of heart muscle that are not working well because of poor blood flow or injury from a heart attack.  Aneurysm detection. An aneurysm is a weak or damaged part of an artery wall. The wall bulges out from the normal force of blood pumping through the  body. Tell a health care provider about:  Any allergies you have.  All medicines you are taking, including vitamins, herbs, eye drops, creams, and over-the-counter medicines.  Any blood disorders you have.  Any surgeries you have had.  Any medical conditions you have.  Whether you are pregnant or may be pregnant. What are the risks? Generally, this is a safe test. However, problems may occur, including an allergic reaction to dye (contrast) that may be used during the test. What happens before the test? No specific preparation is needed. You may eat and drink normally. What happens during the test?  You will take off your clothes from the waist up and put on a hospital gown.  Electrodes or electrocardiogram (ECG)patches may be placed on your chest. The electrodes or patches are then connected to a device that monitors your heart rate and rhythm.  You will lie down on a table for an ultrasound exam. A gel will be applied to your chest to help sound waves pass through your skin.  A handheld device, called a transducer, will be pressed against your chest and moved over your heart. The transducer produces sound waves that travel to your heart and bounce back (or "echo" back) to the transducer. These sound waves will be captured in real-time and changed into images of your heart that can be viewed on a video monitor. The images will be recorded on a computer and reviewed by your health care provider.  You may be asked to change positions or hold your  breath for a short time. This makes it easier to get different views or better views of your heart.  In some cases, you may receive contrast through an IV in one of your veins. This can improve the quality of the pictures from your heart. The procedure may vary among health care providers and hospitals.   What can I expect after the test? You may return to your normal, everyday life, including diet, activities, and medicines, unless your  health care provider tells you not to do that. Follow these instructions at home:  It is up to you to get the results of your test. Ask your health care provider, or the department that is doing the test, when your results will be ready.  Keep all follow-up visits. This is important. Summary  An echocardiogram is a test that uses sound waves (ultrasound) to produce images of the heart.  Images from an echocardiogram can provide important information about the size and shape of your heart, heart muscle function, heart valve function, and other possible heart problems.  You do not need to do anything to prepare before this test. You may eat and drink normally.  After the echocardiogram is completed, you may return to your normal, everyday life, unless your health care provider tells you not to do that. This information is not intended to replace advice given to you by your health care provider. Make sure you discuss any questions you have with your health care provider. Document Revised: 05/16/2020 Document Reviewed: 05/16/2020 Elsevier Patient Education  2021 Elsevier Inc.  Your physician has recommended that you have a sleep study. This test records several body functions during sleep, including: brain activity, eye movement, oxygen and carbon dioxide blood levels, heart rate and rhythm, breathing rate and rhythm, the flow of air through your mouth and nose, snoring, body muscle movements, and chest and belly movement.  A chest x-ray takes a picture of the organs and structures inside the chest, including the heart, lungs, and blood vessels. This test can show several things, including, whether the heart is enlarges; whether fluid is building up in the lungs; and whether pacemaker / defibrillator leads are still in place.  Leave the office make a left, go to second light, [place] make a right, go to [address] make left. Go to front of brick building and you can walk in.   [facility] .    Follow-Up: At [facility], you and your health needs are our priority.  As part of our continuing mission to provide you with exceptional heart care, we have created designated Provider Care Teams.  These Care Teams include your primary Cardiologist (physician) and Advanced Practice Providers (APPs -  Physician Assistants and Nurse Practitioners) who all work together to provide you with the care you need, when you need it.  We recommend signing up for the patient portal called "MyChart".  Sign up information is provided on this After Visit Summary.  MyChart is used to connect with patients for Virtual Visits (Telemedicine).  Patients are able to view lab/test results, encounter notes, upcoming appointments, etc.  Non-urgent messages can be sent to your provider as well.   To learn more about what you can do with MyChart, go to [email or web address].    Your next appointment:   8 week(s) with Tereso Newcomer, PA on Wednesday, June 15 @ 11:15 am.  The format for your next appointment:   In Person  Provider:   Tereso Newcomer, PA-C  Other Instructions  Arranged for a follow up appointment with Dr.Sethi office on Wednesday, May 18 @ 3:30 pm.

## 2021-01-31 ENCOUNTER — Telehealth: Payer: Self-pay | Admitting: Physician Assistant

## 2021-01-31 ENCOUNTER — Telehealth: Payer: Self-pay | Admitting: Cardiovascular Disease

## 2021-01-31 ENCOUNTER — Other Ambulatory Visit: Payer: Self-pay | Admitting: *Deleted

## 2021-01-31 DIAGNOSIS — J189 Pneumonia, unspecified organism: Secondary | ICD-10-CM

## 2021-01-31 LAB — COMPREHENSIVE METABOLIC PANEL
ALT: 10 IU/L (ref 0–44)
AST: 13 IU/L (ref 0–40)
Albumin/Globulin Ratio: 1.8 (ref 1.2–2.2)
Albumin: 4.2 g/dL (ref 3.6–4.6)
Alkaline Phosphatase: 83 IU/L (ref 44–121)
BUN/Creatinine Ratio: 29 — ABNORMAL HIGH (ref 10–24)
BUN: 35 mg/dL — ABNORMAL HIGH (ref 8–27)
Bilirubin Total: 0.5 mg/dL (ref 0.0–1.2)
CO2: 22 mmol/L (ref 20–29)
Calcium: 9.7 mg/dL (ref 8.6–10.2)
Chloride: 104 mmol/L (ref 96–106)
Creatinine, Ser: 1.19 mg/dL (ref 0.76–1.27)
Globulin, Total: 2.3 g/dL (ref 1.5–4.5)
Glucose: 86 mg/dL (ref 65–99)
Potassium: 5.1 mmol/L (ref 3.5–5.2)
Sodium: 140 mmol/L (ref 134–144)
Total Protein: 6.5 g/dL (ref 6.0–8.5)
eGFR: 60 mL/min/{1.73_m2} (ref >59)

## 2021-01-31 LAB — CBC
Hematocrit: 35.4 % — ABNORMAL LOW (ref 37.5–51.0)
Hemoglobin: 12.1 g/dL — ABNORMAL LOW (ref 13.0–17.7)
MCH: 32.4 pg (ref 26.6–33.0)
MCHC: 34.2 g/dL (ref 31.5–35.7)
MCV: 95 fL (ref 79–97)
Platelets: 307 10*3/uL (ref 150–450)
RBC: 3.74 x10E6/uL — ABNORMAL LOW (ref 4.14–5.80)
RDW: 11.7 % (ref 11.6–15.4)
WBC: 10.1 10*3/uL (ref 3.4–10.8)

## 2021-01-31 LAB — TSH: TSH: 2.77 u[IU]/mL (ref 0.450–4.500)

## 2021-01-31 LAB — PRO B NATRIURETIC PEPTIDE: NT-Pro BNP: 142 pg/mL (ref 0–486)

## 2021-01-31 MED ORDER — AZITHROMYCIN 250 MG PO TABS: ORAL_TABLET | ORAL | 0 refills | Status: DC

## 2021-01-31 MED ORDER — AMOXICILLIN-POT CLAVULANATE 875-125 MG PO TABS: 1.0000 | ORAL_TABLET | Freq: Two times a day (BID) | ORAL | 0 refills | Status: DC

## 2021-01-31 NOTE — Telephone Encounter (Signed)
Diane from radiology was calling to let [name] know patient chest xray is available

## 2021-01-31 NOTE — Telephone Encounter (Signed)
Labs: Lab Results  Component Value Date   CREATININE 1.19 01/30/2021   K 5.1 01/30/2021   HGB 12.1 (L) 01/30/2021   ALT 10 01/30/2021   PROBNP 142 01/30/2021   TSH 2.770 01/30/2021   DG Chest 2 View 01/30/2021 IMPRESSION: New focal airspace opacity in the right peripheral lung base, could be developing infection. Recommend follow-up chest radiograph in 6-12 weeks.  Reviewed labs.  Kidney function, potassium, liver enzymes, BNP, TSH normal.  Hemoglobin is mildly low.  White count is normal.  Chest x-ray demonstrates probable right lower lobe pneumonia.  PLAN: 1.  Repeat CBC 4 weeks 2.  Augmentin 875 mg twice daily x5 days 3.  Azithromycin 500 mg x 1, then 250 mg daily x4 days (Z-pak) 4.  Pt needs to see PCP in the next 1-2 weeks.  He will need a repeat CXR in the next 6 weeks (PCP can get this). 5.  Continue current medications. 6.  If dizziness, etc symptoms resolve with tx of pneumonia, he can cancel appt with Dr. Pearlean Brownie (neuro). 7.  Send copy of labs, CXR to PCP. Tereso Newcomer, PA-C    01/31/2021 1:47 PM

## 2021-01-31 NOTE — Telephone Encounter (Signed)
S/w pt's wife per [facility]) is aware of results of CXR and recommendations.  All antibiotics were sent in to requested pharmacy and all instructions were repeated back.  Lab entered in computer and linked with appt.  CXR will be sent to PCP via Epic.

## 2021-01-31 NOTE — Telephone Encounter (Signed)
See CXR result note Tereso Newcomer, PA-C    01/31/2021 1:33 PM

## 2021-02-15 ENCOUNTER — Telehealth: Payer: Self-pay | Admitting: *Deleted

## 2021-02-15 DIAGNOSIS — R0683 Snoring: Secondary | ICD-10-CM

## 2021-02-15 DIAGNOSIS — R5383 Other fatigue: Secondary | ICD-10-CM

## 2021-02-15 DIAGNOSIS — I10 Essential (primary) hypertension: Secondary | ICD-10-CM

## 2021-02-15 NOTE — Telephone Encounter (Signed)
Per [employer] weaver: Your physician has recommended that you have a sleep study.  Split Night ordered and sent to sleep pool.

## 2021-02-19 ENCOUNTER — Telehealth: Payer: Self-pay | Admitting: *Deleted

## 2021-02-19 DIAGNOSIS — R5383 Other fatigue: Secondary | ICD-10-CM

## 2021-02-19 DIAGNOSIS — R0683 Snoring: Secondary | ICD-10-CM

## 2021-02-19 NOTE — Telephone Encounter (Signed)
No precert required ok to schedule

## 2021-02-19 NOTE — Addendum Note (Signed)
Addended by: Reesa Chew on: 02/19/2021 04:20 PM   Modules accepted: Orders

## 2021-02-19 NOTE — Telephone Encounter (Signed)
-----   Message from Gaynelle Cage, [place] sent at 02/16/2021  1:52 PM EDT ----- Regarding: RE: precert This is a [employer] patient ----- Message ----- From: Reesa Chew, CMA Sent: 02/15/2021   9:55 AM EDT To: Cv Div Sleep Studies Subject: precert                                        Your physician has recommended that you have a sleep study.   Split Night

## 2021-02-19 NOTE — Telephone Encounter (Signed)
Prior Authorization for SPLIT NIGHT sent to [facility] via web portal. Tracking Number [id number].

## 2021-02-19 NOTE — Telephone Encounter (Signed)
-----   Message from Wanda M Waddell, CMA sent at 02/16/2021  1:52 PM EDT ----- Regarding: RE: precert This is a [employer] patient ----- Message ----- From: Dior Dominik G, CMA Sent: 02/15/2021   9:55 AM EDT To: Cv Div Sleep Studies Subject: precert                                        Your physician has recommended that you have a sleep study.   Split Night   

## 2021-02-21 ENCOUNTER — Ambulatory Visit: Payer: Self-pay | Admitting: Neurology

## 2021-02-26 ENCOUNTER — Other Ambulatory Visit: Payer: Medicare PPO | Admitting: *Deleted

## 2021-02-26 ENCOUNTER — Other Ambulatory Visit: Payer: Self-pay | Admitting: Cardiovascular Disease

## 2021-02-26 ENCOUNTER — Other Ambulatory Visit: Payer: Self-pay

## 2021-02-26 DIAGNOSIS — E785 Hyperlipidemia, unspecified: Secondary | ICD-10-CM

## 2021-02-26 DIAGNOSIS — I25118 Atherosclerotic heart disease of native coronary artery with other forms of angina pectoris: Secondary | ICD-10-CM

## 2021-02-26 DIAGNOSIS — J189 Pneumonia, unspecified organism: Secondary | ICD-10-CM

## 2021-02-26 DIAGNOSIS — I10 Essential (primary) hypertension: Secondary | ICD-10-CM

## 2021-02-26 LAB — CBC
Hematocrit: 33.6 % — ABNORMAL LOW (ref 37.5–51.0)
Hemoglobin: 11.9 g/dL — ABNORMAL LOW (ref 13.0–17.7)
MCH: 33.1 pg — ABNORMAL HIGH (ref 26.6–33.0)
MCHC: 35.4 g/dL (ref 31.5–35.7)
MCV: 93 fL (ref 79–97)
Platelets: 288 10*3/uL (ref 150–450)
RBC: 3.6 x10E6/uL — ABNORMAL LOW (ref 4.14–5.80)
RDW: 11.6 % (ref 11.6–15.4)
WBC: 7.6 10*3/uL (ref 3.4–10.8)

## 2021-02-26 NOTE — Addendum Note (Signed)
Addended by: Reesa Chew on: 02/26/2021 10:20 AM   Modules accepted: Orders

## 2021-02-26 NOTE — Telephone Encounter (Signed)
Per Dr Excell Seltzer, ok to have a home sleep test. Yes should be fine thanks.

## 2021-02-26 NOTE — Telephone Encounter (Addendum)
In lab study denied does not meet medical necessities due to no comorbid conditions. No PA for Home sleep

## 2021-03-01 ENCOUNTER — Ambulatory Visit (HOSPITAL_COMMUNITY): Payer: Medicare PPO | Attending: Cardiology

## 2021-03-01 ENCOUNTER — Other Ambulatory Visit: Payer: Self-pay

## 2021-03-01 DIAGNOSIS — R0683 Snoring: Secondary | ICD-10-CM

## 2021-03-01 DIAGNOSIS — R5383 Other fatigue: Secondary | ICD-10-CM | POA: Diagnosis present

## 2021-03-01 DIAGNOSIS — R059 Cough, unspecified: Secondary | ICD-10-CM

## 2021-03-01 DIAGNOSIS — I25118 Atherosclerotic heart disease of native coronary artery with other forms of angina pectoris: Secondary | ICD-10-CM

## 2021-03-01 DIAGNOSIS — R634 Abnormal weight loss: Secondary | ICD-10-CM

## 2021-03-01 DIAGNOSIS — I10 Essential (primary) hypertension: Secondary | ICD-10-CM | POA: Diagnosis present

## 2021-03-01 DIAGNOSIS — E782 Mixed hyperlipidemia: Secondary | ICD-10-CM

## 2021-03-01 LAB — ECHOCARDIOGRAM COMPLETE
Area-P 1/2: 2.76 cm2
S' Lateral: 2.9 cm

## 2021-03-01 NOTE — Telephone Encounter (Signed)
Informed patient of upcoming home sleep study and patient understanding was verbalized.  Patient understands her/his HST is scheduled for 04-06-21 at 2. Pt is aware of testing date.

## 2021-03-02 ENCOUNTER — Encounter: Payer: Self-pay | Admitting: *Deleted

## 2021-03-02 ENCOUNTER — Encounter: Payer: Self-pay | Admitting: Physician Assistant

## 2021-03-12 ENCOUNTER — Other Ambulatory Visit: Payer: Self-pay | Admitting: Physician Assistant

## 2021-03-12 ENCOUNTER — Ambulatory Visit
Admission: RE | Admit: 2021-03-12 | Discharge: 2021-03-12 | Disposition: A | Discharge status: Home / Self Care | Payer: Medicare PPO | Source: Ambulatory Visit | Attending: Physician Assistant | Admitting: Physician Assistant

## 2021-03-12 DIAGNOSIS — J189 Pneumonia, unspecified organism: Secondary | ICD-10-CM

## 2021-03-13 NOTE — Progress Notes (Signed)
Not sure why CXR result came to me like this. But, CXR shows resolution of pneumonia and no other abnormality. Continue current medications and f/u as planned.  Tereso Newcomer, PA-C    03/13/2021 4:47 PM

## 2021-03-14 ENCOUNTER — Telehealth: Payer: Self-pay | Admitting: *Deleted

## 2021-03-14 NOTE — Telephone Encounter (Signed)
S/w pt is aware of CXR results.

## 2021-03-14 NOTE — Telephone Encounter (Signed)
lvm for pt to call office for cxr results.

## 2021-03-14 NOTE — Telephone Encounter (Signed)
-----   Message from Beatrice Lecher, [place] sent at 03/13/2021  4:46 PM EDT -----   ----- Message ----- From: Clarisse Gouge Sent: 03/13/2021  12:33 PM EDT To: Beatrice Lecher, PA-C

## 2021-03-20 NOTE — Progress Notes (Signed)
Cardiology Office Note:    Date:  03/21/2021   ID:  [name] Marks, DOB [birth year], MRN [id number]  PCP:  [name] Palau, FNP   [facility] Providers Cardiologist:  [name] Bollman, MD Cardiology APP:  Kennon Rounds      Referring MD: [name] Palau, FNP   Chief Complaint:  Follow-up (Pneumonia)    Patient Profile:    [name] Marks is a 86 y.o. 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 No intracranial large or medium vessel occlusion or correctable proximal stenosis. No sign of aneurysm or vascular malformation with particular attention to the left occipital region. 2. Atherosclerotic disease at both carotid bifurcations but without stenosis or irregularity. 3. 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 Pronounced chronic small-vessel ischemic changes affecting the cerebral hemispheric white matter elsewhere.   Echocardiogram 05/23/2019 EF 60-65, Gr 1 DD, normal RVSF   Stress Echocardiogram 07/12/15 No exercise induced ischemic wall motion abnormalities detected.   1 mm horizontal ST depression noted in the lateral leads with   exercise. Exercise tolerance was fair and there was no chest   pain. Negative stress echo for ischemia.   Cardiac catheterization 11/08/13 LM irregs LAD prox stent patient with 40-50 at prox edge of stent, mid stent patent with 50 ISR   - FFR: 0.79 LCx patent RCA patent; PAVB ost 60-70 EF 55-65 1. Mild to moderate diffuse LAD stenosis with borderline positive  pressure wire analysis 2. Patency of the left circumflex and right coronary arteries was stable ostial stenosis of the right posterior AV segment 3. Normal LV function  Recommendations: Continued medical therapy.   History of Present Illness: [name] Marks was last seen in clinic in 4/22.  He was having symptoms of fatigue, cough and weight loss.  A CXR demonstrated evidence of pneumonia and he was treated with antibiotics. A f/u CXR showed resolution.  An echocardiogram demonstrated normal EF.  He was also set up for a sleep study.  The results are pending.  He returns for f/u.  He is here with his wife.  He is overall doing well.  He has not had chest pain.  He gets short of breath with some activities.  This is unchanged.  He has not had orthopnea, significant leg edema or syncope.  Since being tx for pneumonia, his energy level is better.  He continues to have issues with nephrolithiasis.  He has 1-2 stones that will require surgery for removal.      Past Medical History:  Diagnosis Date   Cerebrovascular disease    DJD (degenerative joint disease)    History of echocardiogram    Echo 5/22: EF 60-65, no RWMA, mild MR, trivial AI, mild-moderate AV sclerosis without stenosis   Hyperlipidemia    Hypertension    Peripheral vascular disease (HCC)    Polyposis of colon    Subdural hematoma (HCC)    history of     Current Medications: Current Meds  Medication Sig   acetaminophen (TYLENOL) 325 MG tablet Take  2 tablets (650 mg total) by mouth every 6 (six) hours as needed for headache.   aspirin EC 81 MG tablet Take 81 mg by mouth daily. Swallow whole.   cholecalciferol (VITAMIN D) 1000 UNITS tablet Take 1,000 Units by mouth 2 (two) times daily.    guaiFENesin (MUCINEX) 600 MG 12 hr tablet Take 600 mg by mouth 2 (two) times daily as needed for cough.   loratadine (CLARITIN) 10 MG tablet Take 10 mg by mouth daily as needed for allergies.   losartan (COZAAR) 100 MG tablet Take 100 mg by mouth  daily.    metoprolol tartrate (LOPRESSOR) 25 MG tablet TAKE 1 TABLET(25 MG) BY MOUTH TWICE DAILY   Multiple Vitamins-Minerals (PRESERVISION AREDS PO) Take 1 tablet by mouth 2 (two) times daily.    simvastatin (ZOCOR) 40 MG tablet TAKE 1 TABLET BY MOUTH EVERY NIGHT AT BEDTIME   [DISCONTINUED] amoxicillin-clavulanate (AUGMENTIN) 875-125 MG tablet Take 1 tablet by mouth 2 (two) times daily.   [DISCONTINUED] azithromycin (ZITHROMAX) 250 MG tablet Two tablets by mouth (500 mg) first day than 1 tablet  By mouth ( 250 mg) X 4 days.   [DISCONTINUED] isosorbide mononitrate (IMDUR) 30 MG 24 hr tablet TAKE 1 TABLET(30 MG) BY MOUTH DAILY     Allergies:   Patient has no known allergies.   Social History   Tobacco Use   Smoking status: Former    Pack years: 0.00   Smokeless tobacco: Former    Types: Chew    Quit date: 09/09/1971  Vaping Use   Vaping Use: Never used  Substance Use Topics   Alcohol use: Never   Drug use: Never     Family Hx: The patient's family history includes Breast cancer in his sister and sister; Heart attack in his father and sister; Heart disease in his brother.  Review of Systems  Respiratory:  Negative for cough.     EKGs/Labs/Other Test Reviewed:    EKG:  EKG is not ordered today.  The ekg ordered today demonstrates n/a  Recent Labs: 01/30/2021: ALT 10; BUN 35; Creatinine, Ser 1.19; NT-Pro BNP 142; Potassium 5.1; Sodium 140; TSH 2.770 02/26/2021: Hemoglobin 11.9; Platelets 288   Recent Lipid Panel Lab Results  Component Value Date/Time   CHOL 123 05/21/2019 08:45 PM   TRIG 72 05/21/2019 08:45 PM   HDL 52 05/21/2019 08:45 PM   LDLCALC 57 05/21/2019 08:45 PM      Risk Assessment/Calculations:      Physical Exam:    VS:  BP 138/76   Pulse 62   Ht 5\' 10"  (1.778 m)   Wt 204 lb (92.5 kg)   SpO2 98%   BMI 29.27 kg/m     Wt Readings from Last 3 Encounters:  03/21/21 204 lb (92.5 kg)  01/30/21 203 lb 4.8 oz (92.2 kg)  07/19/20 215 lb (97.5 kg)      Constitutional:      Appearance: Healthy appearance. Not in distress.  Pulmonary:     Effort: Pulmonary effort is normal.     Breath sounds: No wheezing. No rales.  Cardiovascular:     Normal rate. Regular rhythm. Normal S1. Normal S2.      Murmurs: There is no murmur.  Edema:    Peripheral edema present.    Ankle: trace edema of the left ankle. Abdominal:     Palpations: Abdomen is soft.  Musculoskeletal:     Cervical back: Neck supple. Skin:    General: Skin is warm and dry.  Neurological:     General: No focal deficit present.     Mental Status: Alert and oriented to person, place and time.     Cranial Nerves: Cranial nerves are intact.         ASSESSMENT & PLAN:    1. Coronary artery disease of native artery of native heart with stable angina pectoris (HCC) History of prior LAD stenting.  He had moderate disease in the LAD by cardiac catheterization in 2015.  Medical therapy was recommended secondary to the diffuse nature of disease.  He is doing well without angina.  Continue ASA, statin, nitrates. F/u in 6 mos with Dr. Excell Seltzer.   2. Essential hypertension BP above target.  Increase Isosorbide to 60 mg once daily.  Continue current dose of Losartan, metoprolol tartrate.    3. History of community acquired pneumonia Resolved on most recent CXR.  The patient's symptoms are improved.   4. Preoperative cardiovascular examination He will need a urologic procedure soon for his kidney stones.  He is at low risk for CV complications and can achieve >/= 4 METs.  He can proceed with his urologic procedure at acceptable risk.  He can hold ASA 5-7 days preop if needed and resume after when safe from a bleeding perspective.      Dispo:  Return in about 6 months (around 09/20/2021) for w/ Dr. Excell Seltzer.   Medication Adjustments/Labs and Tests Ordered: Current medicines are reviewed at length with the patient today.  Concerns regarding medicines are outlined above.  Tests  Ordered: No orders of the defined types were placed in this encounter.  Medication Changes: Meds ordered this encounter  Medications   isosorbide mononitrate (IMDUR) 60 MG 24 hr tablet    Sig: Take 1 tablet (60 mg total) by mouth daily.    Dispense:  90 tablet    Refill:  3    Signed, Tereso Newcomer, PA-C  03/21/2021 11:40 AM    [facility] [address] [place], [place], [place]  [address] Phone: [phone]; Fax: [phone]

## 2021-03-21 ENCOUNTER — Encounter: Payer: Self-pay | Admitting: Physician Assistant

## 2021-03-21 ENCOUNTER — Other Ambulatory Visit: Payer: Self-pay

## 2021-03-21 ENCOUNTER — Ambulatory Visit: Payer: Medicare PPO | Admitting: Physician Assistant

## 2021-03-21 VITALS — BP 138/76 | HR 62 | Ht 70.0 in | Wt 204.0 lb

## 2021-03-21 DIAGNOSIS — I10 Essential (primary) hypertension: Secondary | ICD-10-CM

## 2021-03-21 DIAGNOSIS — Z8701 Personal history of pneumonia (recurrent): Secondary | ICD-10-CM

## 2021-03-21 DIAGNOSIS — Z01810 Encounter for preprocedural cardiovascular examination: Secondary | ICD-10-CM

## 2021-03-21 DIAGNOSIS — I25118 Atherosclerotic heart disease of native coronary artery with other forms of angina pectoris: Secondary | ICD-10-CM | POA: Diagnosis not present

## 2021-03-21 MED ORDER — ISOSORBIDE MONONITRATE ER 60 MG PO TB24
60.0000 mg | ORAL_TABLET | Freq: Every day | ORAL | 3 refills | Status: DC
Start: 2021-03-21 — End: 2022-04-04

## 2021-03-21 NOTE — Patient Instructions (Signed)
Medication Instructions:   INCREASE Imdur one tablet by mouth ( 60 mg) daily.   *If you need a refill on your cardiac medications before your next appointment, please call your pharmacy*   Lab Work: -None  If you have labs (blood work) drawn today and your tests are completely normal, you will receive your results only by: MyChart Message (if you have MyChart) OR A paper copy in the mail If you have any lab test that is abnormal or we need to change your treatment, we will call you to review the results.   Testing/Procedures:  -None   Follow-Up: At [facility], you and your health needs are our priority.  As part of our continuing mission to provide you with exceptional heart care, we have created designated Provider Care Teams.  These Care Teams include your primary Cardiologist (physician) and Advanced Practice Providers (APPs -  Physician Assistants and Nurse Practitioners) who all work together to provide you with the care you need, when you need it.  We recommend signing up for the patient portal called "MyChart".  Sign up information is provided on this After Visit Summary.  MyChart is used to connect with patients for Virtual Visits (Telemedicine).  Patients are able to view lab/test results, encounter notes, upcoming appointments, etc.  Non-urgent messages can be sent to your provider as well.   To learn more about what you can do with MyChart, go to [email or web address].    Your next appointment:   6 month(s)  The format for your next appointment:   In Person  Provider:   Tonny Bollman, MD   Other Instructions Your physician wants you to follow-up in: 6 month f/u with Dr. Excell Seltzer.  You will receive a reminder letter in the mail two months in advance. If you don't receive a letter, please call our office to schedule the follow-up appointment.

## 2021-04-06 ENCOUNTER — Ambulatory Visit (HOSPITAL_BASED_OUTPATIENT_CLINIC_OR_DEPARTMENT_OTHER): Payer: Medicare PPO | Admitting: Cardiology

## 2021-04-06 ENCOUNTER — Other Ambulatory Visit: Payer: Self-pay

## 2021-04-16 ENCOUNTER — Other Ambulatory Visit: Payer: Self-pay

## 2021-04-16 ENCOUNTER — Ambulatory Visit (HOSPITAL_BASED_OUTPATIENT_CLINIC_OR_DEPARTMENT_OTHER): Payer: Medicare PPO | Attending: Physician Assistant | Admitting: Cardiology

## 2021-04-16 DIAGNOSIS — I10 Essential (primary) hypertension: Secondary | ICD-10-CM | POA: Diagnosis not present

## 2021-04-16 DIAGNOSIS — R5383 Other fatigue: Secondary | ICD-10-CM | POA: Diagnosis not present

## 2021-04-16 DIAGNOSIS — R0683 Snoring: Secondary | ICD-10-CM | POA: Diagnosis not present

## 2021-04-16 DIAGNOSIS — G4733 Obstructive sleep apnea (adult) (pediatric): Secondary | ICD-10-CM | POA: Diagnosis not present

## 2021-04-16 NOTE — Procedures (Signed)
Erroneous encounter

## 2021-04-16 NOTE — Progress Notes (Signed)
This encounter was created in error - please disregard.

## 2021-04-18 NOTE — Procedures (Signed)
   Patient Name: [name] Marks, [name] Marks Date: 04/16/2021 Gender: Male D.O.B: 12-11-1934 Age (years): 4 Referring Provider: Tereso Newcomer Height (inches): 70 Interpreting Physician: Armanda Magic MD, ABSM Weight (lbs): 200 RPSGT: [place] BMI: 29 MRN: [id number] Neck Size: 16.50  CLINICAL INFORMATION Sleep Study Type: HST  Indication for sleep study: N/A  Epworth Sleepiness Score: 6  SLEEP STUDY TECHNIQUE A multi-channel overnight portable sleep study was performed. The channels recorded were: nasal airflow, thoracic respiratory movement, and oxygen saturation with a pulse oximetry. Snoring was also monitored.  MEDICATIONS Patient self administered medications include: N/A.  SLEEP ARCHITECTURE Patient was studied for 404.7 minutes. The sleep efficiency was 100.0 % and the patient was supine for 57.4%. The arousal index was 0.0 per hour.  RESPIRATORY PARAMETERS The overall AHI was 29.8 per hour, with a central apnea index of 0.1 per hour.  The oxygen nadir was 83% during sleep.  CARDIAC DATA Mean heart rate during sleep was 59.8 bpm.  IMPRESSIONS - Severe obstructive sleep apnea occurred during this study (AHI = 29.8/h). - No significant central sleep apnea occurred during this study (CAI = 0.1/h). - Moderate oxygen desaturation was noted during this study (Min O2 = 83%). - Patient snored 1.5% during the sleep.  DIAGNOSIS - Obstructive Sleep Apnea (G47.33)  RECOMMENDATIONS - Recommend in lab CPAP titration due to severity of sleep disordered breathing. - Avoid alcohol, sedatives and other CNS depressants that may worsen sleep apnea and disrupt normal sleep architecture. - Sleep hygiene should be reviewed to assess factors that may improve sleep quality. - Weight management and regular exercise should be initiated or continued. - Patient may benefit from in-lab study  [Electronically signed] 04/18/2021 11:34 AM  Armanda Magic MD, ABSM Diplomate, American  Board of Sleep Medicine

## 2021-04-19 ENCOUNTER — Telehealth: Payer: Self-pay | Admitting: *Deleted

## 2021-04-19 DIAGNOSIS — G4733 Obstructive sleep apnea (adult) (pediatric): Secondary | ICD-10-CM

## 2021-04-19 NOTE — Telephone Encounter (Signed)
The patient has been notified of the result and verbalized understanding.  All questions (if any) were answered. Latrelle Dodrill, CMA 04/19/2021 2:10 PM    Titration precerted  Tracking #: [id number]

## 2021-04-19 NOTE — Telephone Encounter (Signed)
Ok to schedule TITRATION valid dates are 05/07/21-06/06/21 aut #[id number].  

## 2021-04-19 NOTE — Telephone Encounter (Signed)
Prior Authorization for TITRATION sent to [facility] via web portal. Tracking Number  Tracking #: [id number].

## 2021-04-19 NOTE — Telephone Encounter (Signed)
Ok to schedule TITRATION valid dates are 05/07/21-06/06/21 aut #[id number].

## 2021-04-19 NOTE — Telephone Encounter (Signed)
-----   Message from Quintella Reichert, MD sent at 04/18/2021 11:43 AM EDT ----- Please let patient know that they have sleep apnea.  Recommend therapeutic CPAP titration for treatment of patient's sleep disordered breathing.  If unable to perform an in lab titration then initiate ResMed auto CPAP from 4 to 15cm H2O with heated humidity and mask of choice and overnight pulse ox on CPAP.

## 2021-05-04 ENCOUNTER — Other Ambulatory Visit: Payer: Self-pay | Admitting: Cardiovascular Disease

## 2021-05-10 NOTE — Addendum Note (Signed)
Addended by: Reesa Chew on: 05/10/2021 01:43 PM   Modules accepted: Orders

## 2021-05-15 NOTE — Telephone Encounter (Signed)
Patient is scheduled for CPAP Titration on 05/31/21. Patient understands his titration study will be done at [facility] sleep lab. Patient understands he will receive a letter in a week or so detailing appointment, date, time, and location. Patient understands to call if he does not receive the letter  in a timely manner. Patient agrees with treatment and thanked me for call.

## 2021-05-31 ENCOUNTER — Other Ambulatory Visit: Payer: Self-pay

## 2021-05-31 ENCOUNTER — Ambulatory Visit (HOSPITAL_BASED_OUTPATIENT_CLINIC_OR_DEPARTMENT_OTHER): Payer: Medicare PPO | Attending: Cardiology | Admitting: Cardiology

## 2021-05-31 DIAGNOSIS — G4733 Obstructive sleep apnea (adult) (pediatric): Secondary | ICD-10-CM | POA: Diagnosis not present

## 2021-06-12 NOTE — Procedures (Signed)
    Patient Name: [name], Marks Date: 05/31/2021 Gender: Male D.O.B: 12-04-1934 Age (years): 35 Referring Provider: Tereso Newcomer, PA Height (inches): 70 Interpreting Physician: Armanda Magic MD, ABSM Weight (lbs): 200 RPSGT: [place]ry Ram BMI: 29 MRN: [id number] Neck Size: 16.50  CLINICAL INFORMATION The patient is referred for a CPAP titration to treat sleep apnea.  SLEEP STUDY TECHNIQUE As per the AASM Manual for the Scoring of Sleep and Associated Events v2.3 (April 2016) with a hypopnea requiring 4% desaturations.  The channels recorded and monitored were frontal, central and occipital EEG, electrooculogram (EOG), submentalis EMG (chin), nasal and oral airflow, thoracic and abdominal wall motion, anterior tibialis EMG, snore microphone, electrocardiogram, and pulse oximetry. Continuous positive airway pressure (CPAP) was initiated at the beginning of the study and titrated to treat sleep-disordered breathing.  MEDICATIONS Medications self-administered by patient taken the night of the study : N/A  TECHNICIAN COMMENTS Comments added by technician: Patient was restless all through the night. Comments added by scorer: N/A  RESPIRATORY PARAMETERS Optimal PAP Pressure (cm): 15  AHI at Optimal Pressure (/hr):2.1 Overall Minimal O2 (%):83.0  Supine % at Optimal Pressure (%):30 Minimal O2 at Optimal Pressure (%): 94.0   SLEEP ARCHITECTURE The study was initiated at 10:18:14 PM and ended at 4:54:31 AM.  Sleep onset time was 1.3 minutes and the sleep efficiency was 44.5%. The total sleep time was 176.5 minutes.  The patient spent 23.5% of the night in stage N1 sleep, 66.0% in stage N2 sleep, 0.0% in stage N3 and 10.5% in REM.Stage REM latency was 318.5 minutes  Wake after sleep onset was 218.5. Alpha intrusion was absent. Supine sleep was 17.56%.  CARDIAC DATA The 2 lead EKG demonstrated sinus rhythm. The mean heart rate was 52.6 beats per minute. Other EKG  findings include: PVCs, PACs.  LEG MOVEMENT DATA The total Periodic Limb Movements of Sleep (PLMS) were 0. The PLMS index was 0.0. A PLMS index of <15 is considered normal in adults.  IMPRESSIONS - The optimal PAP pressure was 15 cm of water. - Central sleep apnea was not noted during this titration (CAI = 2.4/h). - Moderate oxygen desaturations were observed during this titration (min O2 = 83.0%). - The patient snored with moderate snoring volume during this titration study. - PACs and PVCs were observed during this study. - Clinically significant periodic limb movements were not noted during this study. Arousals associated with PLMs were rare.  DIAGNOSIS - Obstructive Sleep Apnea (G47.33)  RECOMMENDATIONS - Trial of CPAP therapy on 15 cm H2O with a Large size Resmed Full Face Mask AirFit F20 mask and heated humidification. - Avoid alcohol, sedatives and other CNS depressants that may worsen sleep apnea and disrupt normal sleep architecture. - Sleep hygiene should be reviewed to assess factors that may improve sleep quality. - Weight management and regular exercise should be initiated or continued. - Return to Sleep Center for re-evaluation after 4-6 weeks of therapy  [Electronically signed] 06/12/2021 01:21 PM  Armanda Magic MD, ABSM Diplomate, American Board of Sleep Medicine

## 2021-06-14 NOTE — Telephone Encounter (Addendum)
The patient has been notified of the result and verbalized understanding.   Left detailed message on voicemail and informed patient to call back with questions  Latrelle Dodrill, CMA 06/14/2021 6:04 PM   Mayford Knife, Cornelious Bryant, MD  Reesa Chew, CMA Please let patient know that they had a successful PAP titration and let DME know that orders are in EPIC.  Please set up 6 week OV with me.     Per dr Mayford Knife, Trial of CPAP therapy on 15 cm H2O with a Large size Resmed Full Face Mask AirFit F20 mask and heated humidification.  Upon patient request DME selection is [facility] Patient understands he will be contacted by [facility] to set up his cpap. Patient understands to call if [facility] does not contact him with new setup in a timely manner. Patient understands they will be called once confirmation has been received from [facility] that they have received their new machine to schedule 10 week follow up appointment.   [facility] notified of new cpap order  Please add to airview Patient was grateful for the call and thanked me

## 2021-06-14 NOTE — Progress Notes (Signed)
Sleep study shows OSA. Pt to be set up for CPAP. Tereso Newcomer, PA-C    06/14/2021 9:28 AM

## 2021-06-18 NOTE — Telephone Encounter (Signed)
Return call: [name] Marks called in to get clarity on her husbands study results.  All questions were answered.  Spoke per patients request to Kathleen Lime (daughter) who also wanted calrity to help her parents. All questions were answered. Latrelle Dodrill, CMA 06/18/2021 11:17 AM

## 2021-06-21 ENCOUNTER — Other Ambulatory Visit (HOSPITAL_COMMUNITY): Payer: Self-pay | Admitting: Interventional Radiology

## 2021-06-21 DIAGNOSIS — I611 Nontraumatic intracerebral hemorrhage in hemisphere, cortical: Secondary | ICD-10-CM

## 2021-07-09 ENCOUNTER — Ambulatory Visit (HOSPITAL_COMMUNITY)
Admission: RE | Admit: 2021-07-09 | Discharge: 2021-07-09 | Disposition: A | Discharge status: Home / Self Care | Payer: Medicare PPO | Source: Ambulatory Visit | Attending: Interventional Radiology | Admitting: Interventional Radiology

## 2021-07-09 ENCOUNTER — Encounter (HOSPITAL_COMMUNITY): Payer: Self-pay

## 2021-07-09 ENCOUNTER — Other Ambulatory Visit: Payer: Self-pay

## 2021-07-09 DIAGNOSIS — I611 Nontraumatic intracerebral hemorrhage in hemisphere, cortical: Secondary | ICD-10-CM | POA: Diagnosis present

## 2021-07-09 LAB — POCT I-STAT CREATININE: Creatinine, Ser: 1.2 mg/dL (ref 0.61–1.24)

## 2021-07-09 MED ORDER — IOHEXOL 350 MG/ML SOLN
75.0000 mL | Freq: Once | INTRAVENOUS | Status: AC | PRN
  Administered 2021-07-09: 75 mL via INTRAVENOUS

## 2021-07-11 ENCOUNTER — Telehealth (HOSPITAL_COMMUNITY): Payer: Self-pay

## 2021-07-11 NOTE — Telephone Encounter (Signed)
Called pt regarding recent imaging, no answer, left vm. AW  

## 2021-07-11 NOTE — Telephone Encounter (Signed)
Pt agreed to f/u in 1 year with mrv w/wo. No sx at this time. AW

## 2021-10-23 ENCOUNTER — Telehealth: Payer: Self-pay | Admitting: *Deleted

## 2021-10-23 ENCOUNTER — Other Ambulatory Visit: Payer: Self-pay

## 2021-10-23 ENCOUNTER — Ambulatory Visit: Payer: Medicare PPO | Admitting: Cardiology

## 2021-10-23 ENCOUNTER — Encounter: Payer: Self-pay | Admitting: Cardiology

## 2021-10-23 VITALS — BP 144/70 | HR 66 | Ht 70.0 in | Wt 210.2 lb

## 2021-10-23 DIAGNOSIS — I10 Essential (primary) hypertension: Secondary | ICD-10-CM | POA: Diagnosis not present

## 2021-10-23 DIAGNOSIS — G4733 Obstructive sleep apnea (adult) (pediatric): Secondary | ICD-10-CM | POA: Diagnosis not present

## 2021-10-23 NOTE — Progress Notes (Signed)
Cardiology Office Note:    Date:  10/23/2021   ID:  [name] Brownieavid S Mincer, DOB 08/20/1935, MRN [id number]  PCP:  Elizabeth PalauAnderson, Teresa, FNP  Cardiologist:  Tonny BollmanMichael Cooper, MD    Referring MD: Elizabeth PalauAnderson, Teresa, FNP   Chief Complaint  Patient presents with   Sleep Apnea   Hypertension    History of Present Illness:    [name] Marks is a 87 y.o. male with a hx of ASCAD, hyperlipidemia, hypertension, PAD who was referred for excessive daytime sleepiness.  He underwent home sleep study showing severe obstructive sleep apnea with an AHI of 29.8/h with lowest O2 saturation 83% but not enough to qualify for nocturnal hypoxemia diagnosis.  He underwent CPAP titration to 15 cm H2O and is now here for follow-up.  He is struggling with his CPAP device.  He is using a full face mask which he says is blowing off his face all the time.  He has not been back to the DME for a repeat mask fit.   Since going on CPAP he feels rested in the am and has no significant daytime sleepiness.  He is having a lot of problems with mouth dryness. He turned up his humidity but then got significant condensation in the tube and mask.  He denies any significant nasal dryness or nasal congestion.  He does not think that he snores.     Past Medical History:  Diagnosis Date   Cerebrovascular disease    DJD (degenerative joint disease)    History of echocardiogram    Echo 5/22: EF 60-65, no RWMA, mild MR, trivial AI, mild-moderate AV sclerosis without stenosis   Hyperlipidemia    Hypertension    Peripheral vascular disease (HCC)    Polyposis of colon    Subdural hematoma    history of     Past Surgical History:  Procedure Laterality Date   CARDIAC CATHETERIZATION  03/21/2010   Patent LAD (coronary artery) stents - No significant obstructive disease in the left circumflex -- Patent RCA with moderately severe ostial stenosis of the posterior descending (coronary) artery branch.   CARDIAC CATHETERIZATION  08/01/2009    IR  ANGIO INTRA EXTRACRAN SEL COM CAROTID INNOMINATE BILAT MOD SED  09/14/2019   IR ANGIO VERTEBRAL SEL SUBCLAVIAN INNOMINATE UNI L MOD SED  09/14/2019   IR ANGIO VERTEBRAL SEL VERTEBRAL UNI R MOD SED  09/14/2019   IR US GUIDE VASC ACCESS RIGHT  09/14/2019   LEFT HEART CATHETERIZATION WITH CORONARY ANGIOGRAM N/A 11/08/2013   Procedure: LEFT HEART CATHETERIZATION WITH CORONARY ANGIOGRAM;  Surgeon: Micheline ChapmanMichael D Cooper, MD;  [facility];  Service: Cardiovascular;  Laterality: N/A;    Current Medications: Current Meds  Medication Sig   acetaminophen (TYLENOL) 325 MG tablet Take 2 tablets (650 mg total) by mouth every 6 (six) hours as needed for headache.   aspirin EC 81 MG tablet Take 81 mg by mouth daily. Swallow whole.   cholecalciferol (VITAMIN D) 1000 UNITS tablet Take 1,000 Units by mouth 2 (two) times daily.    guaiFENesin (MUCINEX) 600 MG 12 hr tablet Take 600 mg by mouth 2 (two) times daily as needed for cough.   isosorbide mononitrate (IMDUR) 60 MG 24 hr tablet Take 1 tablet (60 mg total) by mouth daily.   loratadine (CLARITIN) 10 MG tablet Take 10 mg by mouth daily as needed for allergies.   losartan (COZAAR) 100 MG tablet Take 100 mg by mouth daily.    metoprolol tartrate (LOPRESSOR) 25 MG  tablet TAKE 1 TABLET(25 MG) BY MOUTH TWICE DAILY   Multiple Vitamins-Minerals (PRESERVISION AREDS PO) Take 1 tablet by mouth 2 (two) times daily.    simvastatin (ZOCOR) 40 MG tablet TAKE 1 TABLET BY MOUTH EVERY NIGHT AT BEDTIME     Allergies:   Patient has no known allergies.   Social History   Socioeconomic History   Marital status: Married    Spouse name: Not on file   Number of children: Not on file   Years of education: Not on file   Highest education level: Not on file  Occupational History   Not on file  Tobacco Use   Smoking status: Former   Smokeless tobacco: Former    Types: Chew    Quit date: 09/09/1971  Vaping Use   Vaping Use: Never used  Substance and Sexual Activity    Alcohol use: Never   Drug use: Never   Sexual activity: Not on file  Other Topics Concern   Not on file  Social History Narrative   Not on file   Social Determinants of Health   Financial Resource Strain: Not on file  Food Insecurity: Not on file  Transportation Needs: Not on file  Physical Activity: Not on file  Stress: Not on file  Social Connections: Not on file     Family History: The patient's family history includes Breast cancer in his sister and sister; Heart attack in his father and sister; Heart disease in his brother.  ROS:   Please see the history of present illness.    ROS  All other systems reviewed and negative.   EKGs/Labs/Other Studies Reviewed:    The following studies were reviewed today: Home sleep study and CPAP titration  EKG:  EKG is not ordered today.    Recent Labs: 01/30/2021: ALT 10; BUN 35; NT-Pro BNP 142; Potassium 5.1; Sodium 140; TSH 2.770 02/26/2021: Hemoglobin 11.9; Platelets 288 07/09/2021: Creatinine, Ser 1.20   Recent Lipid Panel    Component Value Date/Time   CHOL 123 05/21/2019 2045   TRIG 72 05/21/2019 2045   HDL 52 05/21/2019 2045   CHOLHDL 2.4 05/21/2019 2045   VLDL 14 05/21/2019 2045   LDLCALC 57 05/21/2019 2045     Physical Exam:    VS:  BP (!) 144/70    Pulse 66    Ht 5\' 10"  (1.778 m)    Wt 210 lb 3.2 oz (95.3 kg)    SpO2 97%    BMI 30.16 kg/m     Wt Readings from Last 3 Encounters:  10/23/21 210 lb 3.2 oz (95.3 kg)  05/31/21 203 lb (92.1 kg)  04/16/21 200 lb (90.7 kg)     GEN:  Well nourished, well developed in no acute distress HEENT: Normal NECK: No JVD; No carotid bruits LYMPHATICS: No lymphadenopathy CARDIAC: RRR, no murmurs, rubs, gallops RESPIRATORY:  Clear to auscultation without rales, wheezing or rhonchi  ABDOMEN: Soft, non-tender, non-distended MUSCULOSKELETAL:  No edema; No deformity  SKIN: Warm and dry NEUROLOGIC:  Alert and oriented x 3 PSYCHIATRIC:  Normal affect   ASSESSMENT:    1. OSA  (obstructive sleep apnea)   2. Primary hypertension    PLAN:    In order of problems listed above:   OSA - The patient is tolerating PAP therapy well without any problems. The PAP download performed by his DME was personally reviewed and interpreted by me today and showed an AHI of 0/hr on 15cm with 68% compliance in using more than 4  hours nightly.  The patient has been using and benefiting from PAP use and will continue to benefit from therapy.  -I encouraged him to be more compliant with his device -There does appear to be a significant mask leak on stability so I will get him an appointment with his DME to check his mask fit -I will change his device to auto CPAP from 4 to 15cm H2O and get a download in 4 weeks  2.  Hypertension -BP is adequately controlled on exam -Continue prescription drug management with Lopressor 25 mg twice daily with as needed refills  Followup with me in 8 weeks   Time Spent: 20 minutes total time of encounter, including 15 minutes spent in face-to-face patient care on the date of this encounter. This time includes coordination of care and counseling regarding above mentioned problem list. Remainder of non-face-to-face time involved reviewing chart documents/testing relevant to the patient encounter and documentation in the medical record. I have independently reviewed documentation from referring provider  Medication Adjustments/Labs and Tests Ordered: Current medicines are reviewed at length with the patient today.  Concerns regarding medicines are outlined above.  No orders of the defined types were placed in this encounter.  No orders of the defined types were placed in this encounter.   Signed, Armanda Magic, MD  10/23/2021 2:07 PM    [facility]

## 2021-10-23 NOTE — Telephone Encounter (Signed)
-----   Message from Theresia Majors, RN sent at 10/23/2021  2:18 PM EST ----- Per Dr. Mayford Knife: -I will get him an appointment with his DME to check his mask fit -I will change his device to auto CPAP from 4 to 15cm H2O and get a download in 4 weeks

## 2021-10-23 NOTE — Telephone Encounter (Signed)
Order placed to [facility] via community mesage to get him an appointment with his DME to check his mask fit   -I will change his device to auto CPAP from 4 to 15cm H2O and get a download in 4 weeks

## 2021-10-23 NOTE — Patient Instructions (Signed)
Medication Instructions:  Your physician recommends that you continue on your current medications as directed. Please refer to the Current Medication list given to you today.  *If you need a refill on your cardiac medications before your next appointment, please call your pharmacy*  Follow-Up: At [facility], you and your health needs are our priority.  As part of our continuing mission to provide you with exceptional heart care, we have created designated Provider Care Teams.  These Care Teams include your primary Cardiologist (physician) and Advanced Practice Providers (APPs -  Physician Assistants and Nurse Practitioners) who all work together to provide you with the care you need, when you need it.  Your next appointment:   2 month(s)  The format for your next appointment:   In Person  Provider:  Armanda Magic, MD  Other Instructions Dr. Mayford Knife is changing your device to auto CPAP from 4 to 15cm H2O. She is going to set you up with your DME for a mask fitting.

## 2021-11-14 IMAGING — DX DG CHEST 2V
2 series · 2 of 2 positions shown · non-contrast
Comparison: 01/30/2021

CLINICAL DATA: Right lower lobe pneumonia.

EXAM:
CHEST - 2 VIEW

[dg chest 2 view (1 of 2)]
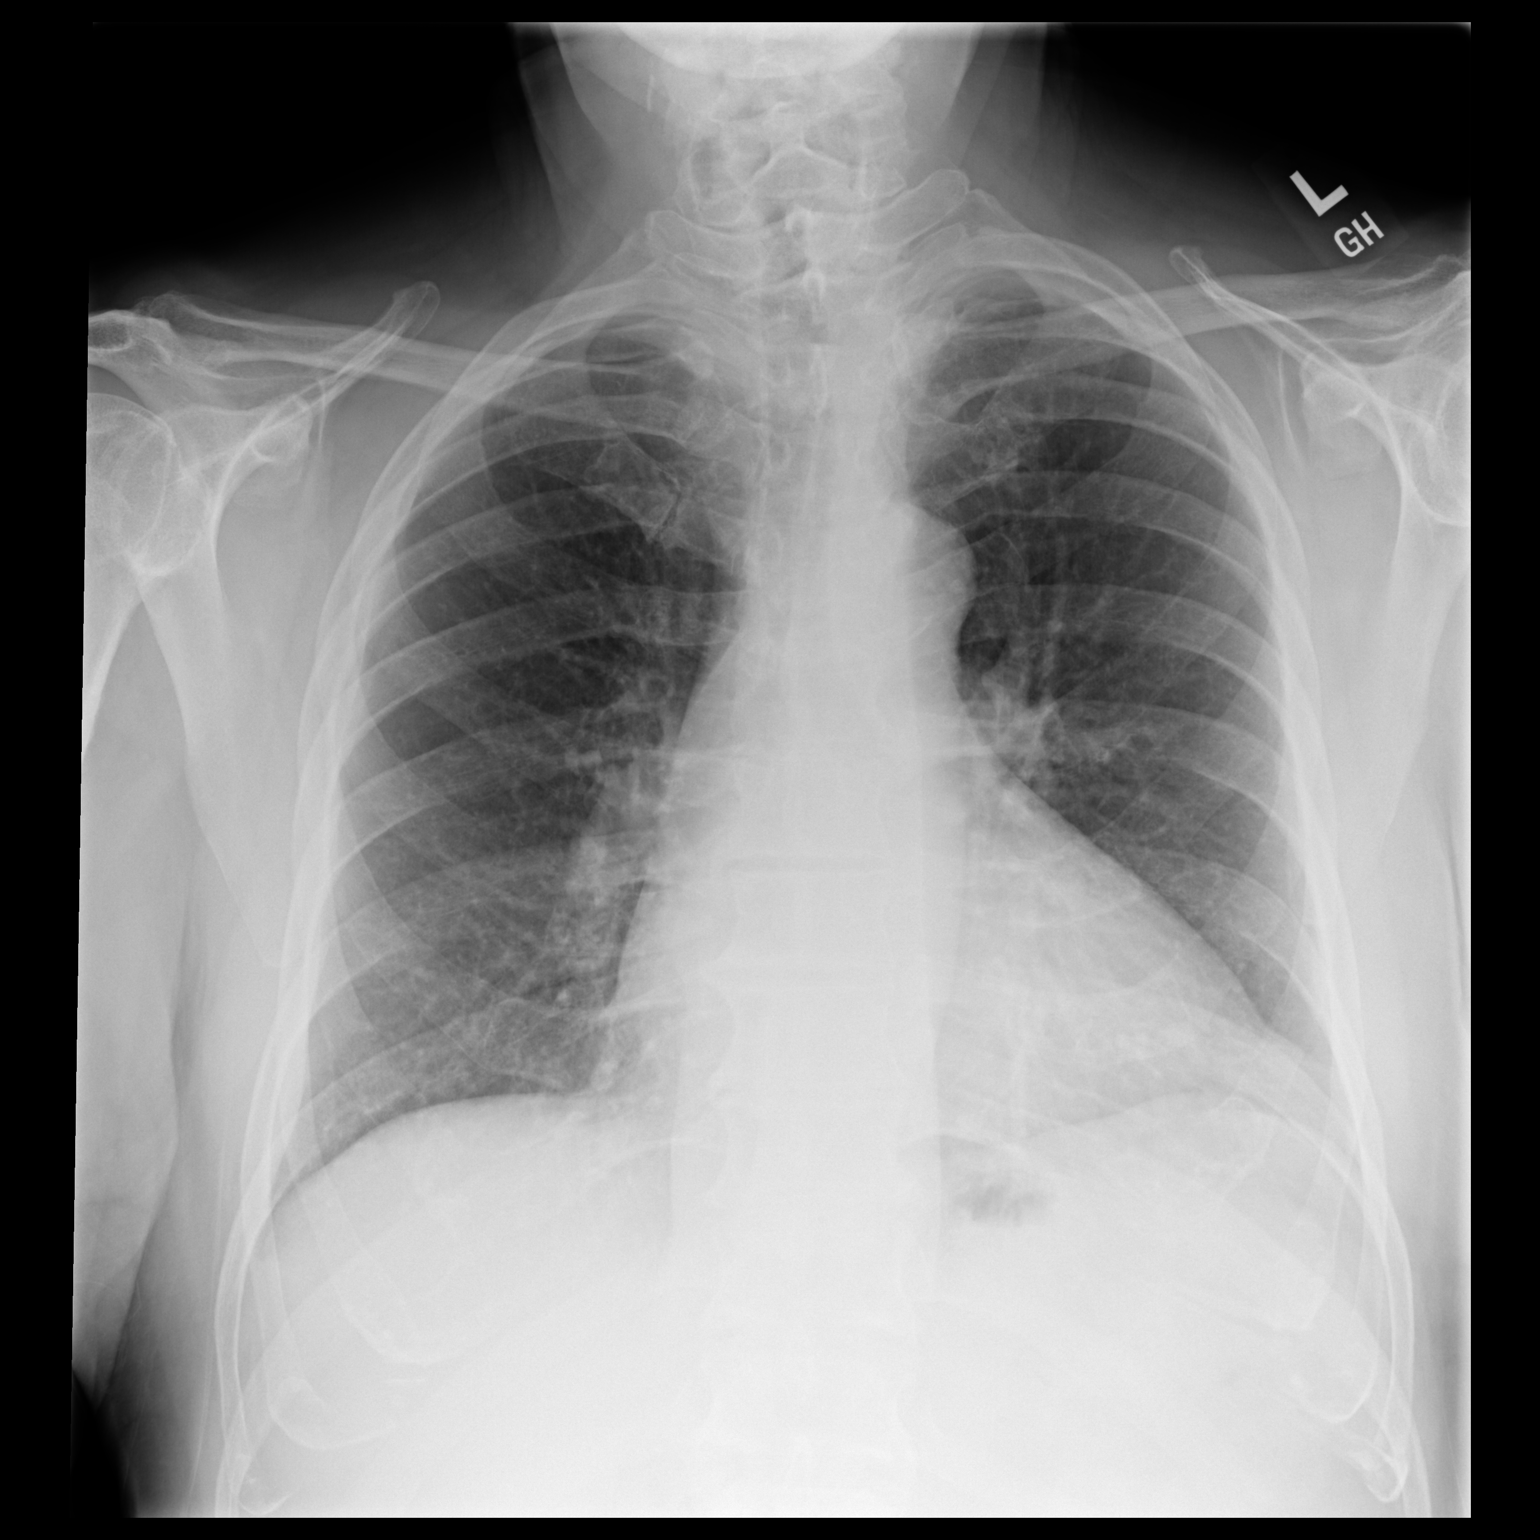

[dg chest 2 view (2 of 2)]
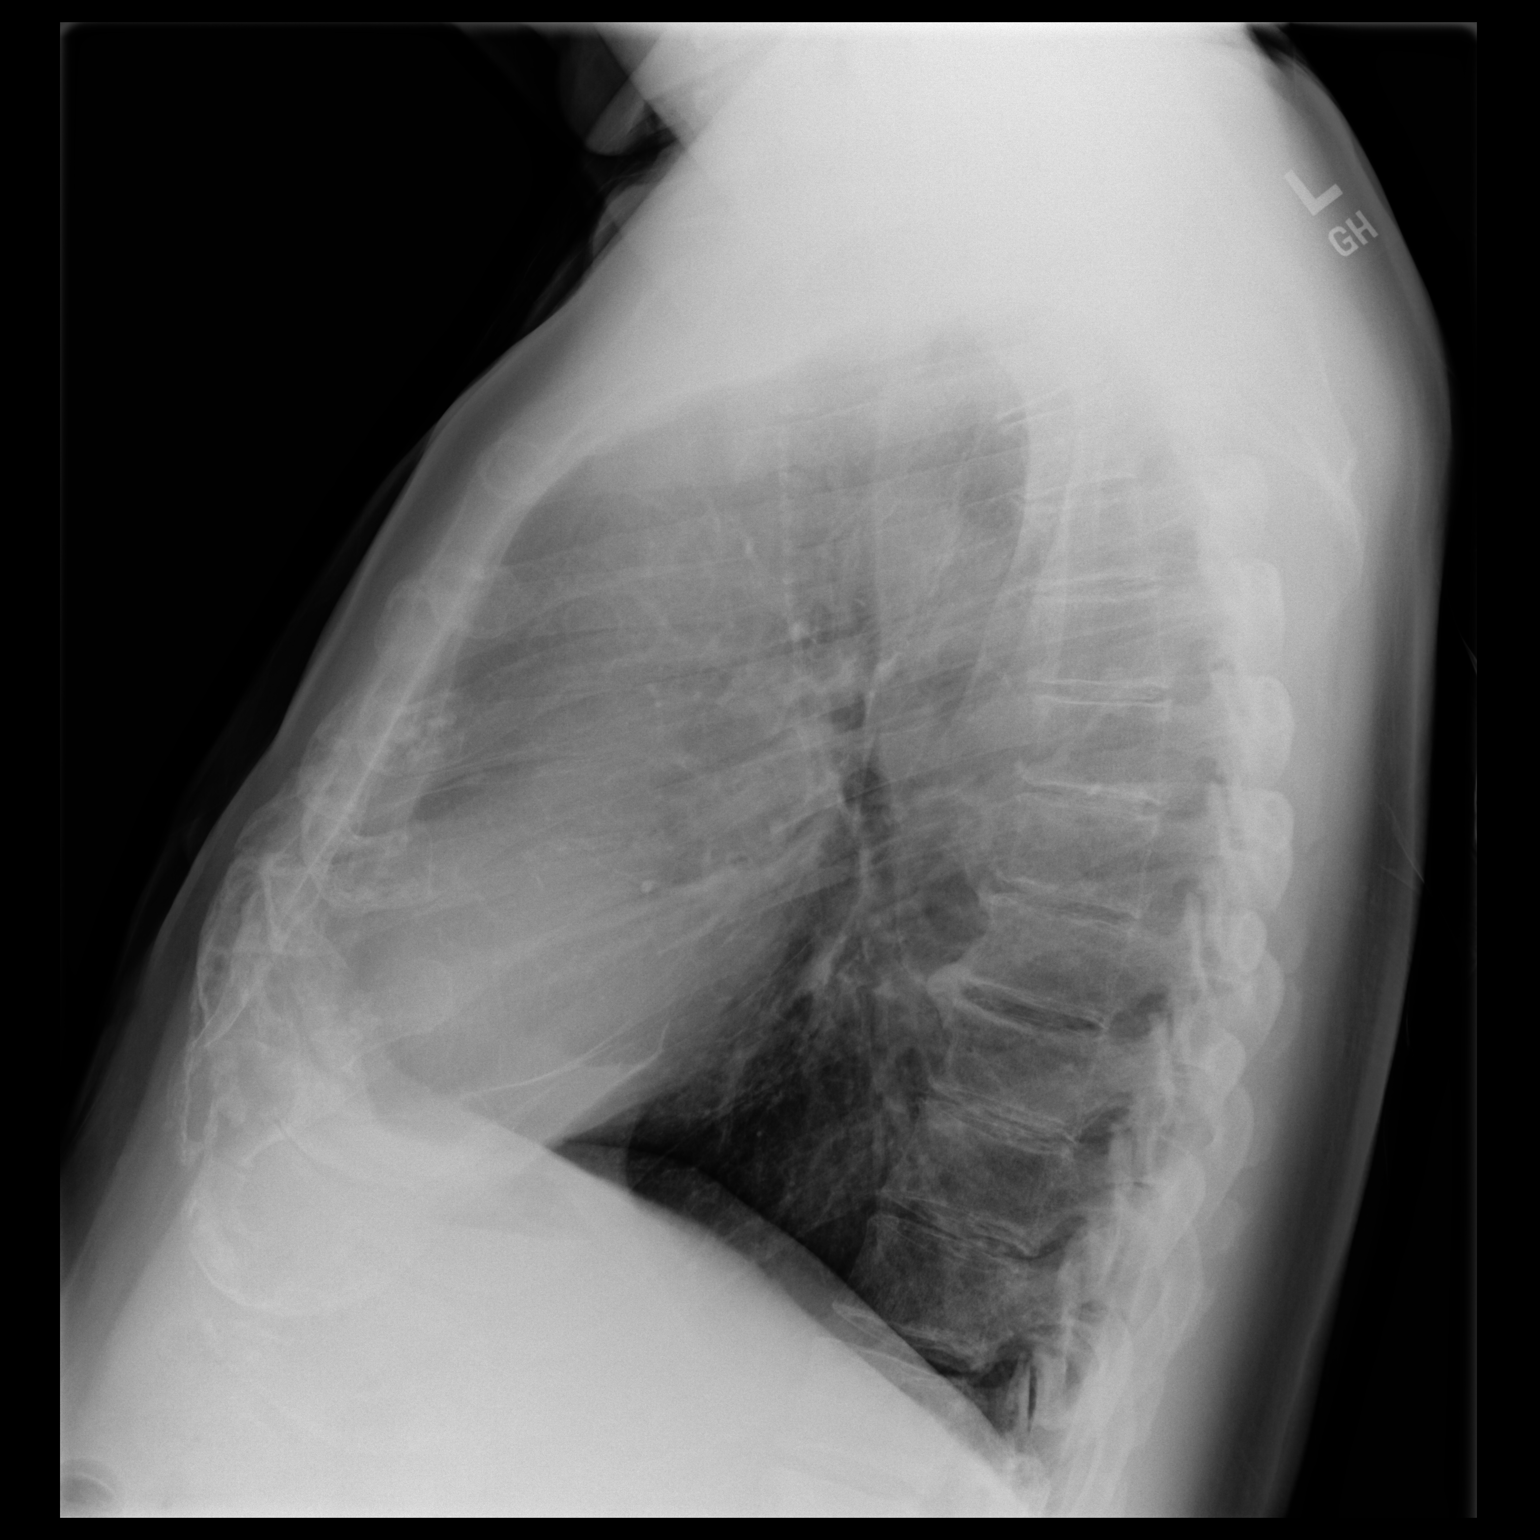

[2 of 2 positions shown; findings below may reference images not displayed]

FINDINGS: Lungs are adequately inflated with resolution of the previously seen
airspace process over the right lower lobe. Remainder of the lungs
are clear. Cardiomediastinal silhouette and remainder of the exam is
unchanged.
IMPRESSION: No active cardiopulmonary disease.

## 2021-12-05 ENCOUNTER — Encounter: Payer: Self-pay | Admitting: Cardiology

## 2022-01-01 ENCOUNTER — Ambulatory Visit: Payer: Medicare PPO | Admitting: Cardiology

## 2022-01-18 ENCOUNTER — Emergency Department (HOSPITAL_BASED_OUTPATIENT_CLINIC_OR_DEPARTMENT_OTHER): Payer: Medicare PPO | Admitting: Radiology

## 2022-01-18 ENCOUNTER — Telehealth: Payer: Self-pay | Admitting: Cardiovascular Disease

## 2022-01-18 ENCOUNTER — Other Ambulatory Visit: Payer: Self-pay

## 2022-01-18 ENCOUNTER — Emergency Department (HOSPITAL_BASED_OUTPATIENT_CLINIC_OR_DEPARTMENT_OTHER)
Admission: EM | Admit: 2022-01-18 | Discharge: 2022-01-18 | Disposition: A | Discharge status: Home / Self Care | Payer: Medicare PPO | Attending: Emergency Medicine | Admitting: Emergency Medicine

## 2022-01-18 ENCOUNTER — Encounter (HOSPITAL_BASED_OUTPATIENT_CLINIC_OR_DEPARTMENT_OTHER): Payer: Self-pay | Admitting: Emergency Medicine

## 2022-01-18 DIAGNOSIS — R079 Chest pain, unspecified: Secondary | ICD-10-CM | POA: Diagnosis present

## 2022-01-18 LAB — BASIC METABOLIC PANEL
Anion gap: 11 (ref 5–15)
BUN: 23 mg/dL (ref 8–23)
CO2: 22 mmol/L (ref 22–32)
Calcium: 9.4 mg/dL (ref 8.9–10.3)
Chloride: 109 mmol/L (ref 98–111)
Creatinine, Ser: 1.23 mg/dL (ref 0.61–1.24)
GFR, Estimated: 58 mL/min — ABNORMAL LOW (ref >60)
Glucose, Bld: 118 mg/dL — ABNORMAL HIGH (ref 70–99)
Potassium: 4 mmol/L (ref 3.5–5.1)
Sodium: 142 mmol/L (ref 135–145)

## 2022-01-18 LAB — CBC
HCT: 37.6 % — ABNORMAL LOW (ref 39.0–52.0)
Hemoglobin: 12.4 g/dL — ABNORMAL LOW (ref 13.0–17.0)
MCH: 32 pg (ref 26.0–34.0)
MCHC: 33 g/dL (ref 30.0–36.0)
MCV: 96.9 fL (ref 80.0–100.0)
Platelets: 220 10*3/uL (ref 150–400)
RBC: 3.88 MIL/uL — ABNORMAL LOW (ref 4.22–5.81)
RDW: 13.6 % (ref 11.5–15.5)
WBC: 7.4 10*3/uL (ref 4.0–10.5)
nRBC: 0 % (ref 0.0–0.2)

## 2022-01-18 LAB — TROPONIN I (HIGH SENSITIVITY)
Troponin I (High Sensitivity): 6 ng/L (ref <18)
Troponin I (High Sensitivity): 5 ng/L (ref <18)

## 2022-01-18 MED ORDER — OMEPRAZOLE 20 MG PO CPDR: 20.0000 mg | DELAYED_RELEASE_CAPSULE | Freq: Every day | ORAL | 0 refills | Status: DC

## 2022-01-18 MED ORDER — LIDOCAINE VISCOUS HCL 2 % MT SOLN
15.0000 mL | Freq: Once | OROMUCOSAL | Status: AC
  Administered 2022-01-18: 15 mL via ORAL
  Filled 2022-01-18: qty 15

## 2022-01-18 MED ORDER — ALUM & MAG HYDROXIDE-SIMETH 200-200-20 MG/5ML PO SUSP
30.0000 mL | Freq: Once | ORAL | Status: AC
  Administered 2022-01-18: 30 mL via ORAL
  Filled 2022-01-18: qty 30

## 2022-01-18 NOTE — Telephone Encounter (Signed)
Pt's wife states that pt was seen in the ER today, she wanted to inform Dr. Excell Seltzer of this. Pt's wife would like a callback. Pt has scheduled appt with Excell Seltzer for 02/04/22. Please advise ?

## 2022-01-18 NOTE — ED Provider Notes (Signed)
?[facility] EMERGENCY DEPT ?Provider Note ? ? ?CSN: [id number] ?Arrival date & time: 01/18/22  0439 ? ?  ? ?History ? ?Chief Complaint  ?Patient presents with  ? Chest Pain  ? ? ?[name] Marks is a 87 y.o. male. ? ?Patient is an 87 year old male with past medical history of coronary artery disease with prior stents in 2007 and 2011, hypertension, hyperlipidemia.  Patient presenting today for evaluation of chest discomfort.  This started at approximately 2 AM and woke him from sleep.  He describes "indigestion" to the center of his chest with associated shortness of breath, but no diaphoresis or radiation.  Patient reports symptoms feel somewhat similar to what he experienced with his prior cardiac issues.  He stated that he felt like if he could "just burp" he would feel better.  Symptoms resolved upon arriving to the ER. ? ?The history is provided by the patient.  ?Chest Pain ?Pain location:  Substernal area ?Pain quality: tightness   ?Pain radiates to:  Does not radiate ?Pain severity:  Moderate ?Onset quality:  Sudden ?Duration:  3 hours ?Timing:  Constant ?Progression:  Resolved ? ?  ? ?Home Medications ?Prior to Admission medications   ?Medication Sig Start Date End Date Taking? Authorizing Provider  ?acetaminophen (TYLENOL) 325 MG tablet Take 2 tablets (650 mg total) by mouth every 6 (six) hours as needed for headache. 05/23/19   Rinehuls, Kinnie Scales, PA-C  ?aspirin EC 81 MG tablet Take 81 mg by mouth daily. Swallow whole.    [provider]  ?cholecalciferol (VITAMIN D) 1000 UNITS tablet Take 1,000 Units by mouth 2 (two) times daily.     [provider]  ?guaiFENesin (MUCINEX) 600 MG 12 hr tablet Take 600 mg by mouth 2 (two) times daily as needed for cough.    [provider]  ?isosorbide mononitrate (IMDUR) 60 MG 24 hr tablet Take 1 tablet (60 mg total) by mouth daily. 03/21/21   Tereso Newcomer T, PA-C  ?loratadine (CLARITIN) 10 MG tablet Take 10 mg by mouth daily as  needed for allergies.    [provider]  ?losartan (COZAAR) 100 MG tablet Take 100 mg by mouth daily.     [provider]  ?metoprolol tartrate (LOPRESSOR) 25 MG tablet TAKE 1 TABLET(25 MG) BY MOUTH TWICE DAILY 02/27/21   Tonny Bollman, MD  ?Multiple Vitamins-Minerals (PRESERVISION AREDS PO) Take 1 tablet by mouth 2 (two) times daily.     [provider]  ?simvastatin (ZOCOR) 40 MG tablet TAKE 1 TABLET BY MOUTH EVERY NIGHT AT BEDTIME 12/21/19   Tonny Bollman, MD  ?   ? ?Allergies    ?Patient has no known allergies.   ? ?Review of Systems   ?Review of Systems  ?Cardiovascular:  Positive for chest pain.  ?All other systems reviewed and are negative. ? ?Physical Exam ?Updated Vital Signs ?BP (!) 185/86   Pulse (!) 58   Resp 15   Wt 90.7 kg   SpO2 99%   BMI 28.70 kg/m?  ?Physical Exam ?Vitals and nursing note reviewed.  ?Constitutional:   ?   General: He is not in acute distress. ?   Appearance: He is well-developed. He is not diaphoretic.  ?HENT:  ?   Head: Normocephalic and atraumatic.  ?Cardiovascular:  ?   Rate and Rhythm: Normal rate and regular rhythm.  ?   Heart sounds: No murmur heard. ?  No friction rub.  ?Pulmonary:  ?   Effort: Pulmonary effort is normal. No  respiratory distress.  ?   Breath sounds: Normal breath sounds. No wheezing or rales.  ?Abdominal:  ?   General: Bowel sounds are normal. There is no distension.  ?   Palpations: Abdomen is soft.  ?   Tenderness: There is no abdominal tenderness.  ?Musculoskeletal:     ?   General: Normal range of motion.  ?   Cervical back: Normal range of motion and neck supple.  ?   Right lower leg: No tenderness. No edema.  ?   Left lower leg: No tenderness. No edema.  ?Skin: ?   General: Skin is warm and dry.  ?Neurological:  ?   Mental Status: He is alert and oriented to person, place, and time.  ?   Coordination: Coordination normal.  ? ? ?ED Results / Procedures / Treatments   ?Labs ?(all labs ordered are listed, but only  abnormal results are displayed) ?Labs Reviewed  ?BASIC METABOLIC PANEL  ?CBC  ?TROPONIN I (HIGH SENSITIVITY)  ? ? ?EKG ?EKG Interpretation ? ?Date/Time:  Friday January 18 2022 05:11:23 EDT ?Ventricular Rate:  54 ?PR Interval:  197 ?QRS Duration: 97 ?QT Interval:  444 ?QTC Calculation: 421 ?R Axis:   -2 ?Text Interpretation: Sinus rhythm Abnormal R-wave progression, early transition Left ventricular hypertrophy similar to earlier in the day Confirmed by Pricilla Loveless [phone]) on 01/19/2022 8:36:35 AM ? ?Radiology ?No results found. ? ?Procedures ?Procedures  ? ? ?Medications Ordered in ED ?Medications - No data to display ? ?ED Course/ Medical Decision Making/ A&P ? ?Patient presenting with complaints of chest discomfort as described in the HPI.  Initial EKG shows no acute changes.  Laboratory studies are pending and care to be signed out to oncoming provider at shift change.  Disposition to be determined based on the results of these studies. ? ?Final Clinical Impression(s) / ED Diagnoses ?Final diagnoses:  ?None  ? ? ?Rx / DC Orders ?ED Discharge Orders   ? ? None  ? ?  ? ? ?  ?Geoffery Lyons, MD ?01/29/22 2306 ? ?

## 2022-01-18 NOTE — Discharge Instructions (Signed)
1.  Call your cardiologist office today to schedule an appointment as soon as possible.  Let them know you are seen in the emergency department this morning for chest pain evaluation. ?2.  Start taking omeprazole every morning for the next 2 weeks.  Your pain may have been due to gastric reflux AKA heartburn.  Review the attached instructions for suggestions on diet and management of this condition. ?3.  Return to the emergency department immediately if you get more episodes of pain, feel dizzy, feel nauseated, feel sweaty, generally weak or other concerning symptoms. ?

## 2022-01-18 NOTE — ED Provider Notes (Signed)
Wax wane CP. Cardiac Hx. Better with GI cocktail. F/U repeat trop, reassess for dispo ?Physical Exam  ?BP 139/73   Pulse (!) 52   Temp 98 ?F (36.7 ?C) (Oral)   Resp 11   Wt 90.7 kg   SpO2 96%   BMI 28.70 kg/m?  ? ?Physical Exam ? ?Procedures  ?Procedures ? ?ED Course / MDM  ?  ?Medical Decision Making ?Amount and/or Complexity of Data Reviewed ?Labs: ordered. ?Radiology: ordered. ? ?Risk ?OTC drugs. ?Prescription drug management. ? ? ?Patient has remained pain-free after GI cocktail.  Serial troponins are negative.  Patient did yard work yesterday with push mowing mowing and other activities without any anginal type symptoms.  He had dinner yesterday evening of a foot long hot dog and [place] fries and onion rings.  He awakened to go to the bathroom early this morning and asked when he suddenly had chest pain. ? ?At this time, with negative diagnostic work-up, normal vital signs and pain resolved, my recommendation is for expeditious follow-up with cardiology.  Patient will trial omeprazole.  Information included for GERD management.  Strict return precautions reviewed with the patient and his wife at bedside for immediate return if any recurrence of symptoms. ? ? ? ? ?  ?Arby Barrette, MD ?01/18/22 [phone] ? ?

## 2022-01-18 NOTE — ED Notes (Signed)
Pt verbalized complete relief of chest pain following GI cocktail. Second trop sent to the lab.  ?

## 2022-01-18 NOTE — ED Triage Notes (Signed)
Pt presents from home for substernal chest pain (nonradiating, accompanied by dizziness, diaphoresis).  ?H.o Mi with stents.  ?Did not take any nitro or ASA at home but did take "an extra metoprolol because my doc told me to do that if I ever felt like this". ?States this feels similar to last cardiac event.  ? ? ?

## 2022-01-18 NOTE — ED Notes (Signed)
Patient transported to X-ray 

## 2022-01-21 NOTE — Telephone Encounter (Signed)
Called pt back and spoke to wife Karen Kitchens who states that he had woke up with bad indigestion and chest pain. Pt received GI cocktail in the ED and still remains pain free at this time after starting Omeprazole. No other concerns or requests at this time. ?

## 2022-02-04 ENCOUNTER — Ambulatory Visit: Payer: Medicare PPO | Admitting: Cardiovascular Disease

## 2022-02-04 ENCOUNTER — Encounter: Payer: Self-pay | Admitting: Cardiovascular Disease

## 2022-02-04 VITALS — BP 160/90 | HR 60 | Ht 70.0 in | Wt 205.2 lb

## 2022-02-04 DIAGNOSIS — I25119 Atherosclerotic heart disease of native coronary artery with unspecified angina pectoris: Secondary | ICD-10-CM | POA: Diagnosis not present

## 2022-02-04 DIAGNOSIS — I10 Essential (primary) hypertension: Secondary | ICD-10-CM | POA: Diagnosis not present

## 2022-02-04 DIAGNOSIS — E782 Mixed hyperlipidemia: Secondary | ICD-10-CM | POA: Diagnosis not present

## 2022-02-04 NOTE — Progress Notes (Signed)
?Cardiology Office Note:   ? ?Date:  02/04/2022  ? ?ID:  [name] Marks, DOB 08-04-1935, MRN [id number] ? ?PCP:  [name] Palau, FNP ?  ?[facility] Providers ?Cardiologist:  [name] Bollman, MD ?Cardiology APP:  [name] Lecher, PA-C  ?Sleep Medicine:  [name] Magic, MD    ? ?Referring MD: [name] Palau, FNP  ? ?Chief Complaint  ?Patient presents with  ? Coronary Artery Disease  ? ? ?History of Present Illness:   ? ?[name] Marks is a 87 y.o. 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 ?The patient is here with his wife today.  He has been doing well.  He denies any episodes of chest pain, chest pressure, or shortness of breath.  He still gets out in his yard to do work.  Denies exertional symptoms.  States home blood pressures run 130s over 70s. ? ?Past Medical History:  ?Diagnosis Date  ? Cerebrovascular disease   ? DJD (degenerative joint disease)   ? History of echocardiogram   ? Echo 5/22: EF 60-65, no RWMA, mild MR, trivial AI, mild-moderate AV sclerosis without stenosis  ? Hyperlipidemia   ? Hypertension   ? Peripheral vascular disease (HCC)   ? Polyposis of colon   ? Subdural hematoma (HCC)   ? history of   ? ? ?Past Surgical History:  ?Procedure Laterality Date  ? CARDIAC CATHETERIZATION  03/21/2010  ? Patent LAD (coronary artery) stents - No significant obstructive disease in the left circumflex -- Patent RCA with moderately severe ostial stenosis of the posterior descending (coronary) artery branch.  ? CARDIAC CATHETERIZATION  08/01/2009   ? IR ANGIO INTRA EXTRACRAN SEL COM CAROTID INNOMINATE BILAT MOD SED  09/14/2019  ? IR ANGIO VERTEBRAL SEL SUBCLAVIAN INNOMINATE UNI L MOD SED   09/14/2019  ? IR ANGIO VERTEBRAL SEL VERTEBRAL UNI R MOD SED  09/14/2019  ? IR US GUIDE VASC ACCESS RIGHT  09/14/2019  ? LEFT HEART CATHETERIZATION WITH CORONARY ANGIOGRAM N/A 11/08/2013  ? Procedure: LEFT HEART CATHETERIZATION WITH CORONARY ANGIOGRAM;  Surgeon: [name] Chapman, MD;  Location: [facility] CATH LAB;  Service: Cardiovascular;  Laterality: N/A;  ? ? ?Current Medications: ?Current Meds  ?Medication Sig  ? acetaminophen (TYLENOL) 325 MG tablet Take 2 tablets (650 mg total) by mouth every 6 (six) hours as needed for headache.  ? aspirin EC 81 MG tablet Take 81 mg by mouth daily. Swallow whole.  ? azelastine (ASTELIN) 0.1 % nasal spray Place into both nostrils as needed.  ? cholecalciferol (VITAMIN D) 1000 UNITS tablet Take 1,000 Units by mouth 2 (two) times daily.   ? guaiFENesin (MUCINEX) 600 MG 12 hr tablet Take 600 mg by mouth 2 (two) times daily as needed for cough.  ? isosorbide mononitrate (IMDUR) 60 MG 24 hr tablet Take 1 tablet (60 mg total) by mouth daily.  ? loratadine (CLARITIN) 10 MG tablet Take 10 mg by mouth daily as needed for allergies.  ? losartan (COZAAR) 100 MG tablet Take 100 mg by mouth daily.   ? metoprolol tartrate (LOPRESSOR) 25 MG tablet TAKE 1 TABLET(25 MG) BY MOUTH TWICE DAILY  ? Multiple Vitamins-Minerals (PRESERVISION AREDS PO) Take 1 tablet by mouth  2 (two) times daily.   ? omeprazole (PRILOSEC) 20 MG capsule Take 1 capsule (20 mg total) by mouth daily. (Patient taking differently: Take 20 mg by mouth as needed.)  ? simvastatin (ZOCOR) 40 MG tablet TAKE 1 TABLET BY MOUTH EVERY NIGHT AT BEDTIME  ?  ? ?Allergies:   Patient has no known allergies.  ? ?Social History  ? ?Socioeconomic History  ? Marital status: Married  ?  Spouse name: Not on file  ? Number of children: Not on file  ? Years of education: Not on file  ? Highest education level: Not on file  ?Occupational History  ? Not on file  ?Tobacco Use  ? Smoking status: Former  ? Smokeless tobacco: Former  ?  Types: Chew  ?  Quit date:  09/09/1971  ?Vaping Use  ? Vaping Use: Never used  ?Substance and Sexual Activity  ? Alcohol use: Never  ? Drug use: Never  ? Sexual activity: Not on file  ?Other Topics Concern  ? Not on file  ?Social History Narrative  ? Not on file  ? ?Social Determinants of Health  ? ?Financial Resource Strain: Not on file  ?Food Insecurity: Not on file  ?Transportation Needs: Not on file  ?Physical Activity: Not on file  ?Stress: Not on file  ?Social Connections: Not on file  ?  ? ?Family History: ?The patient's family history includes Breast cancer in his sister and sister; Heart attack in his father and sister; Heart disease in his brother. ? ?ROS:   ?Please see the history of present illness.    ?All other systems reviewed and are negative. ? ?EKGs/Labs/Other Studies Reviewed:   ? ?EKG:  EKG is not ordered today.  ? ?Recent Labs: ?01/18/2022: BUN 23; Creatinine, Ser 1.23; Hemoglobin 12.4; Platelets 220; Potassium 4.0; Sodium 142  ?Recent Lipid Panel ?   ?Component Value Date/Time  ? CHOL 123 05/21/2019 2045  ? TRIG 72 05/21/2019 2045  ? HDL 52 05/21/2019 2045  ? CHOLHDL 2.4 05/21/2019 2045  ? VLDL 14 05/21/2019 2045  ? LDLCALC 57 05/21/2019 2045  ? ? ? ?Risk Assessment/Calculations:   ?  ? ?    ? ?Physical Exam:   ? ?VS:  BP (!) 160/90   Pulse 60   Ht 5\' 10"  (1.778 m)   Wt 205 lb 3.2 oz (93.1 kg)   SpO2 97%   BMI 29.44 kg/m?    ? ?Wt Readings from Last 3 Encounters:  ?02/04/22 205 lb 3.2 oz (93.1 kg)  ?01/18/22 200 lb (90.7 kg)  ?10/23/21 210 lb 3.2 oz (95.3 kg)  ?  ? ?GEN:  Well nourished, well developed elderly male in no acute distress ?HEENT: Normal ?NECK: No JVD; No carotid bruits ?LYMPHATICS: No lymphadenopathy ?CARDIAC: RRR, no murmurs, rubs, gallops ?RESPIRATORY:  Clear to auscultation without rales, wheezing or rhonchi  ?ABDOMEN: Soft, non-tender, non-distended ?MUSCULOSKELETAL:  No edema; No deformity  ?SKIN: Warm and dry ?NEUROLOGIC:  Alert and oriented x 3 ?PSYCHIATRIC:  Normal affect  ? ?ASSESSMENT:   ? ?1.  Coronary artery disease involving native coronary artery of native heart with angina pectoris (HCC)   ?2. Essential hypertension   ?3. Mixed hyperlipidemia   ? ?PLAN:   ? ?In order of problems listed above: ? ?Stable without symptoms of angina.  We will continue on aspirin for antiplatelet therapy, isosorbide, metoprolol, and simvastatin. ?Blood pressure on my recheck today is 178/85.  He reports longstanding whitecoat hypertension.  States blood pressures are elevated when  he sees the ophthalmologist, dentist, or any of his medical providers.  Reports home blood pressures never higher than 130s over 70s.  I asked him to check his blood pressure twice weekly and report readings to [place] if they are greater than 140/90. ?Lipids reviewed from his primary care office which were drawn in December 2022 with a cholesterol 144, triglycerides 60, HDL 61, LDL 71.  He will continue current management.  This includes use of a statin drug. ? ?   ? ?   ? ? ?Medication Adjustments/Labs and Tests Ordered: ?Current medicines are reviewed at length with the patient today.  Concerns regarding medicines are outlined above.  ?No orders of the defined types were placed in this encounter. ? ?No orders of the defined types were placed in this encounter. ? ? ?Patient Instructions  ?Medication Instructions:  ?Your physician recommends that you continue on your current medications as directed. Please refer to the Current Medication list given to you today. ? ?*If you need a refill on your cardiac medications before your next appointment, please call your pharmacy* ? ? ?Lab Work: ?NONE ?If you have labs (blood work) drawn today and your tests are completely normal, you will receive your results only by: ?MyChart Message (if you have MyChart) OR ?A paper copy in the mail ?If you have any lab test that is abnormal or we need to change your treatment, we will call you to review the results. ? ? ?Testing/Procedures: ?**Please see attached BP  instructions. Check your pressure daily and let us know if your reading reaches and maintains greater than 140/90** ? ? ?Follow-Up: ?At [facility], you and your health needs are our priority.  As part of our cont

## 2022-02-04 NOTE — Patient Instructions (Signed)
Medication Instructions:  ?Your physician recommends that you continue on your current medications as directed. Please refer to the Current Medication list given to you today. ? ?*If you need a refill on your cardiac medications before your next appointment, please call your pharmacy* ? ? ?Lab Work: ?NONE ?If you have labs (blood work) drawn today and your tests are completely normal, you will receive your results only by: ?MyChart Message (if you have MyChart) OR ?A paper copy in the mail ?If you have any lab test that is abnormal or we need to change your treatment, we will call you to review the results. ? ? ?Testing/Procedures: ?**Please see attached BP instructions. Check your pressure daily and let us know if your reading reaches and maintains greater than 140/90** ? ? ?Follow-Up: ?At [facility], you and your health needs are our priority.  As part of our continuing mission to provide you with exceptional heart care, we have created designated Provider Care Teams.  These Care Teams include your primary Cardiologist (physician) and Advanced Practice Providers (APPs -  Physician Assistants and Nurse Practitioners) who all work together to provide you with the care you need, when you need it. ? ?Your next appointment:   ?1 year(s) ? ?The format for your next appointment:   ?In Person ? ?Provider:   ?Tonny Bollman, MD   ? ?  ? ?Important Information About Sugar ? ? ? ? ?  ?

## 2022-02-21 ENCOUNTER — Other Ambulatory Visit: Payer: Self-pay | Admitting: Cardiovascular Disease

## 2022-02-21 DIAGNOSIS — I10 Essential (primary) hypertension: Secondary | ICD-10-CM

## 2022-02-21 DIAGNOSIS — E785 Hyperlipidemia, unspecified: Secondary | ICD-10-CM

## 2022-02-21 DIAGNOSIS — I25118 Atherosclerotic heart disease of native coronary artery with other forms of angina pectoris: Secondary | ICD-10-CM

## 2022-04-04 ENCOUNTER — Other Ambulatory Visit: Payer: Self-pay | Admitting: Physician Assistant

## 2022-04-04 NOTE — Progress Notes (Signed)
Sleep Office Note:    Date:  04/05/2022   ID:  [name] Marks, DOB Feb 27, 1935, MRN [id number]  PCP:  Elizabeth Palau, FNP  Cardiologist:  Tonny Bollman, MD    Referring MD: Elizabeth Palau, FNP   Chief Complaint  Patient presents with   Sleep Apnea   Hypertension    History of Present Illness:    [name] Marks is a 87 y.o. male with a hx of ASCAD, hyperlipidemia, hypertension, PAD who was referred for excessive daytime sleepiness.  He underwent home sleep study showing severe obstructive sleep apnea with an AHI of 29.8/h with lowest O2 saturation 83% but not enough to qualify for nocturnal hypoxemia diagnosis.  He underwent CPAP titration to 15 cm H2O and is now here for follow-up.  When I last saw him he was not tolerating his CPAP at a fixed pressure and had a lot of issues with mask leak.  He was changed to auto CPAP and was send back to the DME for a mask fit but he says that the DME never called for an appt.  He tells me that the mask is leaking a lot and the air is cold.     Past Medical History:  Diagnosis Date   Cerebrovascular disease    DJD (degenerative joint disease)    History of echocardiogram    Echo 5/22: EF 60-65, no RWMA, mild MR, trivial AI, mild-moderate AV sclerosis without stenosis   Hyperlipidemia    Hypertension    Peripheral vascular disease (HCC)    Polyposis of colon    Subdural hematoma (HCC)    history of     Past Surgical History:  Procedure Laterality Date   CARDIAC CATHETERIZATION  03/21/2010   Patent LAD (coronary artery) stents - No significant obstructive disease in the left circumflex -- Patent RCA with moderately severe ostial stenosis of the posterior descending (coronary) artery branch.   CARDIAC CATHETERIZATION  08/01/2009    IR ANGIO INTRA EXTRACRAN SEL COM CAROTID INNOMINATE BILAT MOD SED  09/14/2019   IR ANGIO VERTEBRAL SEL SUBCLAVIAN INNOMINATE UNI L MOD SED  09/14/2019   IR ANGIO VERTEBRAL SEL VERTEBRAL UNI R MOD SED   09/14/2019   IR US GUIDE VASC ACCESS RIGHT  09/14/2019   LEFT HEART CATHETERIZATION WITH CORONARY ANGIOGRAM N/A 11/08/2013   Procedure: LEFT HEART CATHETERIZATION WITH CORONARY ANGIOGRAM;  Surgeon: Micheline Chapman, MD;  Location: [facility] CATH LAB;  Service: Cardiovascular;  Laterality: N/A;    Current Medications: Current Meds  Medication Sig   acetaminophen (TYLENOL) 325 MG tablet Take 2 tablets (650 mg total) by mouth every 6 (six) hours as needed for headache.   aspirin EC 81 MG tablet Take 81 mg by mouth daily. Swallow whole.   azelastine (ASTELIN) 0.1 % nasal spray Place into both nostrils as needed.   cholecalciferol (VITAMIN D) 1000 UNITS tablet Take 1,000 Units by mouth 2 (two) times daily.    guaiFENesin (MUCINEX) 600 MG 12 hr tablet Take 600 mg by mouth 2 (two) times daily as needed for cough.   isosorbide mononitrate (IMDUR) 60 MG 24 hr tablet TAKE 1 TABLET(60 MG) BY MOUTH DAILY   loratadine (CLARITIN) 10 MG tablet Take 10 mg by mouth daily as needed for allergies.   losartan (COZAAR) 100 MG tablet Take 100 mg by mouth daily.    metoprolol tartrate (LOPRESSOR) 25 MG tablet TAKE 1 TABLET(25 MG) BY MOUTH TWICE DAILY   Multiple Vitamins-Minerals (PRESERVISION AREDS PO) Take 1 tablet by  mouth 2 (two) times daily.    omeprazole (PRILOSEC) 20 MG capsule Take 1 capsule (20 mg total) by mouth daily.   simvastatin (ZOCOR) 40 MG tablet TAKE 1 TABLET BY MOUTH EVERY NIGHT AT BEDTIME     Allergies:   Patient has no known allergies.   Social History   Socioeconomic History   Marital status: Married    Spouse name: Not on file   Number of children: Not on file   Years of education: Not on file   Highest education level: Not on file  Occupational History   Not on file  Tobacco Use   Smoking status: Former   Smokeless tobacco: Former    Types: Chew    Quit date: 09/09/1971  Vaping Use   Vaping Use: Never used  Substance and Sexual Activity   Alcohol use: Never   Drug use: Never   Sexual  activity: Not on file  Other Topics Concern   Not on file  Social History Narrative   Not on file   Social Determinants of Health   Financial Resource Strain: Not on file  Food Insecurity: Not on file  Transportation Needs: Not on file  Physical Activity: Not on file  Stress: Not on file  Social Connections: Not on file     Family History: The patient's family history includes Breast cancer in his sister and sister; Heart attack in his father and sister; Heart disease in his brother.  ROS:   Please see the history of present illness.    ROS  All other systems reviewed and negative.   EKGs/Labs/Other Studies Reviewed:    The following studies were reviewed today: Home sleep study and CPAP titration  EKG:  EKG is not ordered today.    Recent Labs: 01/18/2022: BUN 23; Creatinine, Ser 1.23; Hemoglobin 12.4; Platelets 220; Potassium 4.0; Sodium 142   Recent Lipid Panel    Component Value Date/Time   CHOL 123 05/21/2019 2045   TRIG 72 05/21/2019 2045   HDL 52 05/21/2019 2045   CHOLHDL 2.4 05/21/2019 2045   VLDL 14 05/21/2019 2045   LDLCALC 57 05/21/2019 2045     Physical Exam:    VS:  BP (!) 156/88   Pulse 61   Ht 5\' 10"  (1.778 m)   Wt 205 lb 9.6 oz (93.3 kg)   SpO2 98%   BMI 29.50 kg/m     Wt Readings from Last 3 Encounters:  04/05/22 205 lb 9.6 oz (93.3 kg)  02/04/22 205 lb 3.2 oz (93.1 kg)  01/18/22 200 lb (90.7 kg)     GEN: Well nourished, well developed in no acute distress HEENT: Normal NECK: No JVD; No carotid bruits LYMPHATICS: No lymphadenopathy CARDIAC:RRR, no murmurs, rubs, gallops RESPIRATORY:  Clear to auscultation without rales, wheezing or rhonchi  ABDOMEN: Soft, non-tender, non-distended MUSCULOSKELETAL:  No edema; No deformity  SKIN: Warm and dry NEUROLOGIC:  Alert and oriented x 3 PSYCHIATRIC:  Normal affect   ASSESSMENT:    1. OSA (obstructive sleep apnea)   2. Primary hypertension    PLAN:    In order of problems listed  above:   OSA  -The PAP download performed by his DME was personally reviewed and interpreted by me today and showed an AHI of 0.1/hr on auto CPAP2 cm H2O with 2.2% compliance in using more than 4 hours nightly.   -he never got an appt with his DME for a mask fit so still struggling with his mask -he says that  the air is not heated and is very cold -I will get an appt with the DME for his device to be checked, get instruction on how to use the device and also get another mask fit  2.  Hypertension -BP borderline controlled on exam today>>was 132/51mmHg yesterday at PCP office and he just took his meds before he came -continue prescription drug management with Lopressor 25mg  BID, Losartan 100mg  daily and IMdur 60mg  daily with PRN refills  Followup with me in 3 months   Time Spent: 20 minutes total time of encounter, including 15 minutes spent in face-to-face patient care on the date of this encounter. This time includes coordination of care and counseling regarding above mentioned problem list. Remainder of non-face-to-face time involved reviewing chart documents/testing relevant to the patient encounter and documentation in the medical record. I have independently reviewed documentation from referring provider  Medication Adjustments/Labs and Tests Ordered: Current medicines are reviewed at length with the patient today.  Concerns regarding medicines are outlined above.  No orders of the defined types were placed in this encounter.  No orders of the defined types were placed in this encounter.   Signed, , MD  04/05/2022 9:03 AM    [facility]

## 2022-04-05 ENCOUNTER — Encounter: Payer: Self-pay | Admitting: Cardiology

## 2022-04-05 ENCOUNTER — Telehealth: Payer: Self-pay | Admitting: *Deleted

## 2022-04-05 ENCOUNTER — Ambulatory Visit: Payer: Medicare PPO | Admitting: Cardiology

## 2022-04-05 VITALS — BP 156/88 | HR 61 | Ht 70.0 in | Wt 205.6 lb

## 2022-04-05 DIAGNOSIS — I10 Essential (primary) hypertension: Secondary | ICD-10-CM | POA: Diagnosis not present

## 2022-04-05 DIAGNOSIS — G4733 Obstructive sleep apnea (adult) (pediatric): Secondary | ICD-10-CM

## 2022-04-05 NOTE — Patient Instructions (Signed)
[facility] will get you an appointment with your DME  Medication Instructions:  Your physician recommends that you continue on your current medications as directed. Please refer to the Current Medication list given to you today.  *If you need a refill on your cardiac medications before your next appointment, please call your pharmacy*  Follow-Up: At [facility], you and your health needs are our priority.  As part of our continuing mission to provide you with exceptional heart care, we have created designated Provider Care Teams.  These Care Teams include your primary Cardiologist (physician) and Advanced Practice Providers (APPs -  Physician Assistants and Nurse Practitioners) who all work together to provide you with the care you need, when you need it.  Your next appointment:   3 month(s)  The format for your next appointment:   In Person  Provider:   Armanda Magic, MD  Important Information About Sugar

## 2022-04-05 NOTE — Telephone Encounter (Signed)
Patient has an appointment with [facility] 04/10/22. To have his mask fit. Patient notified and agrees to treatment.

## 2022-06-23 NOTE — Progress Notes (Unsigned)
Sleep Office Note:    Date:  06/24/2022   ID:  [name] Marks, DOB 04-28-1935, MRN [id number]  PCP:  Elizabeth Palau, FNP  Cardiologist:  Tonny Bollman, MD    Referring MD: Elizabeth Palau, FNP   Chief Complaint  Patient presents with   Sleep Apnea   Hypertension    History of Present Illness:    [name] Marks is a 87 y.o. male with a hx of ASCAD, hyperlipidemia, hypertension, PAD who was referred for excessive daytime sleepiness.  He underwent home sleep study showing severe obstructive sleep apnea with an AHI of 29.8/h with lowest O2 saturation 83% but not enough to qualify for nocturnal hypoxemia diagnosis.  He underwent CPAP titration to 15 cm H2O and is now here for follow-up.  He has had some issues with no tolerating his CPAP at a fixed pressure and had a lot of issues with mask leak.  He was changed to auto CPAP and was send back to the DME for a mask fit but at last appt he told me that the DME never called for an appt.  His mask was still leaking a lot and the air was cold.  I got him another appt with the DME for his device to be checked, get instruction on how to use the device and also get another mask fit.  He is doing well with his PAP device and thinks that he has gotten used to it.  He tolerates the mask and feels the pressure is adequate.  Since going on PAP he feels rested in the am and has no significant daytime sleepiness.  Occasionally he will nap if he sits down for a little while in the afternoon.  He denies any significant mouth or nasal dryness or nasal congestion.  He does not think that he snores.    Past Medical History:  Diagnosis Date   Cerebrovascular disease    DJD (degenerative joint disease)    History of echocardiogram    Echo 5/22: EF 60-65, no RWMA, mild MR, trivial AI, mild-moderate AV sclerosis without stenosis   Hyperlipidemia    Hypertension    Peripheral vascular disease (HCC)    Polyposis of colon    Subdural hematoma (HCC)     history of     Past Surgical History:  Procedure Laterality Date   CARDIAC CATHETERIZATION  03/21/2010   Patent LAD (coronary artery) stents - No significant obstructive disease in the left circumflex -- Patent RCA with moderately severe ostial stenosis of the posterior descending (coronary) artery branch.   CARDIAC CATHETERIZATION  08/01/2009    IR ANGIO INTRA EXTRACRAN SEL COM CAROTID INNOMINATE BILAT MOD SED  09/14/2019   IR ANGIO VERTEBRAL SEL SUBCLAVIAN INNOMINATE UNI L MOD SED  09/14/2019   IR ANGIO VERTEBRAL SEL VERTEBRAL UNI R MOD SED  09/14/2019   IR US GUIDE VASC ACCESS RIGHT  09/14/2019   LEFT HEART CATHETERIZATION WITH CORONARY ANGIOGRAM N/A 11/08/2013   Procedure: LEFT HEART CATHETERIZATION WITH CORONARY ANGIOGRAM;  Surgeon: Micheline Chapman, MD;  Location: [facility] CATH LAB;  Service: Cardiovascular;  Laterality: N/A;    Current Medications: Current Meds  Medication Sig   acetaminophen (TYLENOL) 325 MG tablet Take 2 tablets (650 mg total) by mouth every 6 (six) hours as needed for headache.   aspirin EC 81 MG tablet Take 81 mg by mouth daily. Swallow whole.   azelastine (ASTELIN) 0.1 % nasal spray Place into both nostrils as needed.   cholecalciferol (VITAMIN D)  1000 UNITS tablet Take 1,000 Units by mouth 2 (two) times daily.    guaiFENesin (MUCINEX) 600 MG 12 hr tablet Take 600 mg by mouth 2 (two) times daily as needed for cough.   isosorbide mononitrate (IMDUR) 60 MG 24 hr tablet TAKE 1 TABLET(60 MG) BY MOUTH DAILY   loratadine (CLARITIN) 10 MG tablet Take 10 mg by mouth daily as needed for allergies.   losartan (COZAAR) 100 MG tablet Take 100 mg by mouth daily.    metoprolol tartrate (LOPRESSOR) 25 MG tablet TAKE 1 TABLET(25 MG) BY MOUTH TWICE DAILY   Multiple Vitamins-Minerals (PRESERVISION AREDS PO) Take 1 tablet by mouth 2 (two) times daily.    omeprazole (PRILOSEC) 20 MG capsule Take 1 capsule (20 mg total) by mouth daily.   simvastatin (ZOCOR) 40 MG tablet TAKE 1 TABLET BY  MOUTH EVERY NIGHT AT BEDTIME     Allergies:   Patient has no known allergies.   Social History   Socioeconomic History   Marital status: Married    Spouse name: Not on file   Number of children: Not on file   Years of education: Not on file   Highest education level: Not on file  Occupational History   Not on file  Tobacco Use   Smoking status: Former   Smokeless tobacco: Former    Types: Chew    Quit date: 09/09/1971  Vaping Use   Vaping Use: Never used  Substance and Sexual Activity   Alcohol use: Never   Drug use: Never   Sexual activity: Not on file  Other Topics Concern   Not on file  Social History Narrative   Not on file   Social Determinants of Health   Financial Resource Strain: Not on file  Food Insecurity: Not on file  Transportation Needs: Not on file  Physical Activity: Not on file  Stress: Not on file  Social Connections: Not on file     Family History: The patient's family history includes Breast cancer in his sister and sister; Heart attack in his father and sister; Heart disease in his brother.  ROS:   Please see the history of present illness.    ROS  All other systems reviewed and negative.   EKGs/Labs/Other Studies Reviewed:    The following studies were reviewed today: Home sleep study and CPAP titration  EKG:  EKG is not ordered today.    Recent Labs: 01/18/2022: BUN 23; Creatinine, Ser 1.23; Hemoglobin 12.4; Platelets 220; Potassium 4.0; Sodium 142   Recent Lipid Panel    Component Value Date/Time   CHOL 123 05/21/2019 2045   TRIG 72 05/21/2019 2045   HDL 52 05/21/2019 2045   CHOLHDL 2.4 05/21/2019 2045   VLDL 14 05/21/2019 2045   LDLCALC 57 05/21/2019 2045     Physical Exam:    VS:  BP 110/62   Pulse 63   Ht 5\' 10"  (1.778 m)   Wt 204 lb (92.5 kg)   SpO2 99%   BMI 29.27 kg/m     Wt Readings from Last 3 Encounters:  06/24/22 204 lb (92.5 kg)  04/05/22 205 lb 9.6 oz (93.3 kg)  02/04/22 205 lb 3.2 oz (93.1 kg)      GEN: Well nourished, well developed in no acute distress HEENT: Normal NECK: No JVD; No carotid bruits LYMPHATICS: No lymphadenopathy CARDIAC:RRR, no murmurs, rubs, gallops RESPIRATORY:  Clear to auscultation without rales, wheezing or rhonchi  ABDOMEN: Soft, non-tender, non-distended MUSCULOSKELETAL:  No edema; No deformity  SKIN: Warm and dry NEUROLOGIC:  Alert and oriented x 3 PSYCHIATRIC:  Normal affect   ASSESSMENT:    1. OSA (obstructive sleep apnea)   2. Essential hypertension    PLAN:    In order of problems listed above:   OSA - The patient is tolerating PAP therapy well without any problems. The PAP download performed by his DME was personally reviewed and interpreted by me today and showed an AHI of 0.1/hr on auto CPAP from 4 to 15 cm H2O with 2% compliance in using more than 4 hours nightly.  The patient has been using and benefiting from PAP use and will continue to benefit from therapy.  -he had a skin lesions worked on with Derm and did not use his device for a week and is going to have Moh's surgery next month -I encouraged him to try to use his device at lieast 5 hours nightly  2.  Hypertension -Bp controlled on exam today -continue prescription drug management with Lopressor 25mg  BID, Losartan 100mg  daily and Imdur 60mg  daily with PRN refills.  Followup with me in 1 year   Time Spent: 20 minutes total time of encounter, including 15 minutes spent in face-to-face patient care on the date of this encounter. This time includes coordination of care and counseling regarding above mentioned problem list. Remainder of non-face-to-face time involved reviewing chart documents/testing relevant to the patient encounter and documentation in the medical record. I have independently reviewed documentation from referring provider  Medication Adjustments/Labs and Tests Ordered: Current medicines are reviewed at length with the patient today.  Concerns regarding medicines are  outlined above.  No orders of the defined types were placed in this encounter.  No orders of the defined types were placed in this encounter.   Signed, Fransico Him, MD  06/24/2022 8:13 AM    [place]

## 2022-06-24 ENCOUNTER — Ambulatory Visit: Payer: Medicare PPO | Attending: Cardiology | Admitting: Cardiology

## 2022-06-24 ENCOUNTER — Encounter: Payer: Self-pay | Admitting: Cardiology

## 2022-06-24 VITALS — BP 110/62 | HR 63 | Ht 70.0 in | Wt 204.0 lb

## 2022-06-24 DIAGNOSIS — G4733 Obstructive sleep apnea (adult) (pediatric): Secondary | ICD-10-CM | POA: Diagnosis not present

## 2022-06-24 DIAGNOSIS — I10 Essential (primary) hypertension: Secondary | ICD-10-CM | POA: Diagnosis not present

## 2022-06-24 NOTE — Patient Instructions (Signed)
Medication Instructions:  Your physician recommends that you continue on your current medications as directed. Please refer to the Current Medication list given to you today.  *If you need a refill on your cardiac medications before your next appointment, please call your pharmacy*  Follow-Up: At [facility], you and your health needs are our priority.  As part of our continuing mission to provide you with exceptional heart care, we have created designated Provider Care Teams.  These Care Teams include your primary Cardiologist (physician) and Advanced Practice Providers (APPs -  Physician Assistants and Nurse Practitioners) who all work together to provide you with the care you need, when you need it.  Your next appointment:   1 year(s)  The format for your next appointment:   In Person  Provider:   Traci Turner, MD     Important Information About Sugar       

## 2022-06-26 ENCOUNTER — Telehealth: Payer: Self-pay | Admitting: *Deleted

## 2022-06-26 ENCOUNTER — Other Ambulatory Visit (HOSPITAL_COMMUNITY): Payer: Self-pay | Admitting: Interventional Radiology

## 2022-06-26 NOTE — Telephone Encounter (Signed)
Patient understands his AHI showed normal. Pt is aware and agreeable to improving in compliance.

## 2022-06-26 NOTE — Telephone Encounter (Signed)
-----   Message from Sueanne Margarita, MD sent at 06/22/2022 10:56 PM EDT ----- Regarding: RE: Monday appt 9/18 for sleep Needs to improve compliance ----- Message ----- From: Freada Bergeron, CMA Sent: 06/21/2022   4:34 PM EDT To: Sueanne Margarita, MD Subject: Monday appt 9/18 for sleep                     Wireless Compliance Report for [name] Date generated: 06/21/2022 Location Information Organization: [employer] Address 1: [place] Address 2: [place]: [employer]: [place] ZIP code: [phone] Phone: Physician Information Name: Rosine Organization: [employer] Address 1: [place], [place], [place] Address 2: [address]: [place]: [place] ZIP code: [phone] Phone: [phone] Patient Information Patient name: [name] Marks Patient ID: [id number] Device serial: [id number] Gender: Male Date of birth: 1935/07/14 Therapy start: 08/15/2021 Address [address]: [address] Address 2: Country: [place]: [place]  State: [place] ZIP code: [phone] Device Settings Mode Auto-CPAP Ramp pressure 4.0 cmH2O Minimum pressure 4.0 cmH2O Maximum pressure 15.0 cmH2O Compliance Summary Start date 08/15/2021 End date 11/12/2021 Days in range 90 Days of use 72 / 90 days (80.0%) Total usage in date range 2 days, 16 hours, 37 minutes Average usage (on days used) 54 minutes Average usage (on all days) 43 minutes Maximum usage 4 hours, 9 minutes Minimum usage 0 minutes Days with usage >= 4 hours 2.2% (2 of 90 days) Usage Summary Mean pressure 13.0 cmH2O Average P95 14.1 cmH2O Highest daily average pressure 14.6 cmH2O Lowest daily average pressure 0.0 cmH2O Average high leak time 4 hours, 31 minutes Average leak 104.1 LPM Average AHI 0.1 Average CAI 0.0 Key P95: device was at or under this pressure 95% of the time High leak time: amount of time spent blowing  with a leak > 90 L/min. CAI: clear airway apnea index Page 1 of

## 2022-07-08 ENCOUNTER — Telehealth: Payer: Self-pay | Admitting: *Deleted

## 2022-07-08 ENCOUNTER — Telehealth: Payer: Self-pay | Admitting: Cardiovascular Disease

## 2022-07-08 NOTE — Telephone Encounter (Signed)
Jasmine from  have [place] at [facility] was returning phone call. Please call back at [phone]

## 2022-07-08 NOTE — Telephone Encounter (Signed)
   Pre-operative Risk Assessment    Patient Name: [name] Marks  DOB: 1935/07/13 MRN: [id number]      Request for Surgical Clearance    Procedure:   WAITING ON CALL BACK FROM SURGEON'S OFFICE, AS WAS LEFT OFF CLEARANCE REQUEST.  Date of Surgery:  Clearance 07/19/22                                 Surgeon:  Rutherford Nail, MD Surgeon's Group or Practice Name:  [facility] Phone number:  [phone] Fax number:  [phone]   Type of Clearance Requested:   - Pharmacy:  Hold Aspirin X'S 7 DAYS   Type of Anesthesia:  MAC   Additional requests/questions:    Astrid Divine   07/08/2022, 9:30 AM

## 2022-07-08 NOTE — Telephone Encounter (Signed)
Duplicate encounter.  Please use original preop clearance encounter.

## 2022-07-08 NOTE — Telephone Encounter (Signed)
Jasmine returning call. She states the procedure is a Vitrectomy internal limiting membrane peel. She says they are requesting to hold the medication for 7 days prior.

## 2022-07-08 NOTE — Telephone Encounter (Signed)
Dr. Radford Pax, you recently saw this patient on 06/24/2022 on whom we have now received a request to hold aspirin for 7 days prior to vitrectomy. Could you please comment on his clearance for upcoming procedure to p cv div preop?  Thank you, Emmaline Life, NP-C  07/08/2022, 3:48 PM [address], [address] Office [phone] Fax [phone]

## 2022-07-08 NOTE — Telephone Encounter (Signed)
Returned call to [place], [place], she is on lunch.  Left another message for her to call back with the procedure and to give it to the operator that answers so we want continue to play phone tag.

## 2022-07-09 NOTE — Telephone Encounter (Signed)
Primary [place], MD   Preoperative team, please contact this patient and set up a phone call appointment for further preoperative risk assessment. Please obtain consent and complete medication review. Thank you for your help.   Per office protocol, he may hold aspirin for 7 days prior to procedure pending no symptoms of ACS which will be determined during virtual visit.   Emmaline Life, NP-C  07/09/2022, 7:31 AM [address], [address] Office [phone] Fax [phone]

## 2022-07-09 NOTE — Telephone Encounter (Signed)
Left message for pt to call back for tele pre op appt.  

## 2022-07-10 ENCOUNTER — Telehealth: Payer: Self-pay | Admitting: *Deleted

## 2022-07-10 NOTE — Telephone Encounter (Signed)
Left message x 2 to call for tele pre op appt  

## 2022-07-10 NOTE — Telephone Encounter (Signed)
I s/w both the pt and his wife and they are agreeable to plan of care. Tele pre op appt 07/11/22 @ 10:20. Med rec and consent are done.

## 2022-07-10 NOTE — Telephone Encounter (Signed)
Pt is returning call and is requesting call back. Req call back on cell.

## 2022-07-10 NOTE — Telephone Encounter (Signed)
I s/w both the pt and his wife and they are agreeable to plan of care. Tele pre op appt 07/11/22 @ 10:20. Med rec and consent are done.      Patient Consent for Virtual Visit        [name] Marks has provided verbal consent on 07/10/2022 for a virtual visit (video or telephone).   CONSENT FOR VIRTUAL VISIT FOR:  [name] Marks  By participating in this virtual visit I agree to the following:  I hereby voluntarily request, consent and authorize [facility] and its employed or contracted physicians, physician assistants, nurse practitioners or other licensed health care professionals (the Practitioner), to provide me with telemedicine health care services (the "Services") as deemed necessary by the treating Practitioner. I acknowledge and consent to receive the Services by the Practitioner via telemedicine. I understand that the telemedicine visit will involve communicating with the Practitioner through live audiovisual communication technology and the disclosure of certain medical information by electronic transmission. I acknowledge that I have been given the opportunity to request an in-person assessment or other available alternative prior to the telemedicine visit and am voluntarily participating in the telemedicine visit.  I understand that I have the right to withhold or withdraw my consent to the use of telemedicine in the course of my care at any time, without affecting my right to future care or treatment, and that the Practitioner or I may terminate the telemedicine visit at any time. I understand that I have the right to inspect all information obtained and/or recorded in the course of the telemedicine visit and may receive copies of available information for a reasonable fee.  I understand that some of the potential risks of receiving the Services via telemedicine include:  Delay or interruption in medical evaluation due to technological equipment failure or  disruption; Information transmitted may not be sufficient (e.g. poor resolution of images) to allow for appropriate medical decision making by the Practitioner; and/or  In rare instances, security protocols could fail, causing a breach of personal health information.  Furthermore, I acknowledge that it is my responsibility to provide information about my medical history, conditions and care that is complete and accurate to the best of my ability. I acknowledge that Practitioner's advice, recommendations, and/or decision may be based on factors not within their control, such as incomplete or inaccurate data provided by me or distortions of diagnostic images or specimens that may result from electronic transmissions. I understand that the practice of medicine is not an exact science and that Practitioner makes no warranties or guarantees regarding treatment outcomes. I acknowledge that a copy of this consent can be made available to me via my patient portal ([facility]), or I can request a printed copy by calling the office of [facility].    I understand that my insurance will be billed for this visit.   I have read or had this consent read to me. I understand the contents of this consent, which adequately explains the benefits and risks of the Services being provided via telemedicine.  I have been provided ample opportunity to ask questions regarding this consent and the Services and have had my questions answered to my satisfaction. I give my informed consent for the services to be provided through the use of telemedicine in my medical care

## 2022-07-11 ENCOUNTER — Ambulatory Visit: Payer: Medicare PPO | Attending: Cardiology | Admitting: General Practice

## 2022-07-11 DIAGNOSIS — Z01810 Encounter for preprocedural cardiovascular examination: Secondary | ICD-10-CM

## 2022-07-11 NOTE — Progress Notes (Signed)
Virtual Visit via Telephone Note   Because of [name] Marks co-morbid illnesses, he is at least at moderate risk for complications without adequate follow up.  This format is felt to be most appropriate for this patient at this time.  The patient did not have access to video technology/had technical difficulties with video requiring transitioning to audio format only (telephone).  All issues noted in this document were discussed and addressed.  No physical exam could be performed with this format.  Please refer to the patient's chart for his consent to telehealth for [facility].  Evaluation Performed:  Preoperative cardiovascular risk assessment _____________   Date:  07/11/2022   Patient ID:  [name] Marks, DOB 1934/10/27, MRN [id number] Patient Location:  Home Provider location:   Office  Primary Care Provider:  Vicenta Aly, [place] Primary Cardiologist:  Sherren Mocha, MD  Chief Complaint / Patient Profile   87 y.o. y/o male with a h/o coronary artery disease, hyperlipidemia, hypertension who is pending vitrectomy and presents today for telephonic preoperative cardiovascular risk assessment.  Past Medical History    Past Medical History:  Diagnosis Date   Cerebrovascular disease    DJD (degenerative joint disease)    History of echocardiogram    Echo 5/22: EF 60-65, no RWMA, mild MR, trivial AI, mild-moderate AV sclerosis without stenosis   Hyperlipidemia    Hypertension    Peripheral vascular disease ([place])    Polyposis of colon    Subdural hematoma ([place])    history of    Past Surgical History:  Procedure Laterality Date   CARDIAC CATHETERIZATION  03/21/2010   Patent LAD (coronary artery) stents - No significant obstructive disease in the left circumflex -- Patent RCA with moderately severe ostial stenosis of the posterior descending (coronary) artery branch.   CARDIAC CATHETERIZATION  08/01/2009    IR ANGIO INTRA EXTRACRAN SEL COM CAROTID INNOMINATE  BILAT MOD SED  09/14/2019   IR ANGIO VERTEBRAL SEL SUBCLAVIAN INNOMINATE UNI L MOD SED  09/14/2019   IR ANGIO VERTEBRAL SEL VERTEBRAL UNI R MOD SED  09/14/2019   IR US GUIDE VASC ACCESS RIGHT  09/14/2019   LEFT HEART CATHETERIZATION WITH CORONARY ANGIOGRAM N/A 11/08/2013   Procedure: LEFT HEART CATHETERIZATION WITH CORONARY ANGIOGRAM;  Surgeon: Blane Ohara, MD;  Location: [facility] CATH LAB;  Service: Cardiovascular;  Laterality: N/A;    Allergies  No Known Allergies  History of Present Illness    [name] Marks is a 87 y.o. male who presents via audio/video conferencing for a telehealth visit today.  Pt was last seen in cardiology clinic on 02/04/2022 by Dr. Burt Knack.  At that time [name] Marks was doing well .  The patient is now pending procedure as outlined above. Since his last visit, he remains stable from a cardiac standpoint.  Today he denies chest pain, shortness of breath, lower extremity edema, fatigue, palpitations, melena, hematuria, hemoptysis, diaphoresis, weakness, presyncope, syncope, orthopnea, and PND.   Home Medications    Prior to Admission medications   Medication Sig Start Date End Date Taking? Authorizing Provider  acetaminophen (TYLENOL) 325 MG tablet Take 2 tablets (650 mg total) by mouth every 6 (six) hours as needed for headache. 05/23/19   Rinehuls, Early Chars, PA-C  aspirin EC 81 MG tablet Take 81 mg by mouth daily. Swallow whole.    [provider]  azelastine (ASTELIN) 0.1 % nasal spray Place into both nostrils as needed. 09/03/21   [provider]  cholecalciferol (  VITAMIN D) 1000 UNITS tablet Take 1,000 Units by mouth 2 (two) times daily.     [provider]  guaiFENesin (MUCINEX) 600 MG 12 hr tablet Take 600 mg by mouth 2 (two) times daily as needed for cough.    [provider]  isosorbide mononitrate (IMDUR) 60 MG 24 hr tablet TAKE 1 TABLET(60 MG) BY MOUTH DAILY 04/04/22   Richardson Dopp T, PA-C  loratadine (CLARITIN) 10 MG  tablet Take 10 mg by mouth daily as needed for allergies.    [provider]  losartan (COZAAR) 100 MG tablet Take 100 mg by mouth daily.     [provider]  metoprolol tartrate (LOPRESSOR) 25 MG tablet TAKE 1 TABLET(25 MG) BY MOUTH TWICE DAILY 02/21/22   Sherren Mocha, MD  Multiple Vitamins-Minerals (PRESERVISION AREDS PO) Take 1 tablet by mouth 2 (two) times daily.     [provider]  omeprazole (PRILOSEC) 20 MG capsule Take 1 capsule (20 mg total) by mouth daily. 01/18/22   Charlesetta Shanks, MD  simvastatin (ZOCOR) 40 MG tablet TAKE 1 TABLET BY MOUTH EVERY NIGHT AT BEDTIME 12/21/19   Sherren Mocha, MD    Physical Exam    Vital Signs:  [name] Marks does not have vital signs available for review today.  Given telephonic nature of communication, physical exam is limited. AAOx3. NAD. Normal affect.  Speech and respirations are unlabored.  Accessory Clinical Findings    None  Assessment & Plan    1.  Preoperative Cardiovascular Risk Assessment: Vitrectomy, Dr. Rutherford Nail, [place] retina  The patient was advised that if he develops new symptoms prior to surgery to contact our office to arrange for a follow-up visit, and he verbalized understanding.  His aspirin may be held for 7 days prior to his procedure.  Please resume as soon as hemostasis is achieved.  He is able to complete greater than 4 METS of physical activity.  A copy of this note will be routed to requesting surgeon.  Time:   Today, I have spent 5 minutes with the patient with telehealth technology discussing medical history, symptoms, and management plan.  Prior to his telephone visit/evaluation I spent greater than 10 minutes reviewing his past medical history and cardiac medications.   Deberah Pelton, NP  07/11/2022, 7:59 AM

## 2022-08-01 ENCOUNTER — Other Ambulatory Visit (HOSPITAL_COMMUNITY): Payer: Self-pay | Admitting: Interventional Radiology

## 2022-08-01 ENCOUNTER — Telehealth (HOSPITAL_COMMUNITY): Payer: Self-pay

## 2022-08-01 DIAGNOSIS — I611 Nontraumatic intracerebral hemorrhage in hemisphere, cortical: Secondary | ICD-10-CM

## 2022-08-01 NOTE — Telephone Encounter (Signed)
Called to schedule mrv, no answer, left vm. AW  

## 2022-08-15 ENCOUNTER — Ambulatory Visit (HOSPITAL_COMMUNITY)
Admission: RE | Admit: 2022-08-15 | Discharge: 2022-08-15 | Disposition: A | Discharge status: Home / Self Care | Payer: Medicare PPO | Source: Ambulatory Visit | Attending: Interventional Radiology | Admitting: Interventional Radiology

## 2022-08-15 DIAGNOSIS — I611 Nontraumatic intracerebral hemorrhage in hemisphere, cortical: Secondary | ICD-10-CM | POA: Diagnosis present

## 2022-08-15 MED ORDER — GADOBUTROL 1 MMOL/ML IV SOLN
10.0000 mL | Freq: Once | INTRAVENOUS | Status: AC | PRN
  Administered 2022-08-15: 10 mL via INTRAVENOUS

## 2022-08-21 ENCOUNTER — Telehealth (HOSPITAL_COMMUNITY): Payer: Self-pay

## 2022-08-21 NOTE — Telephone Encounter (Signed)
Called pt regarding recent imaging,no further f/u needed, no answer, left vm. AW

## 2022-08-21 NOTE — Telephone Encounter (Signed)
Per Dr. Corliss Skains, after viewing recent imaging, no further f/u is needed. Pt is to call if he becomes symptomatic. Relayed message to pt's wife. She agreed with this plan. AW

## 2022-12-30 ENCOUNTER — Other Ambulatory Visit: Payer: Self-pay

## 2022-12-30 ENCOUNTER — Inpatient Hospital Stay (HOSPITAL_COMMUNITY): Payer: Medicare PPO

## 2022-12-30 ENCOUNTER — Encounter (HOSPITAL_COMMUNITY): Payer: Self-pay

## 2022-12-30 ENCOUNTER — Inpatient Hospital Stay (HOSPITAL_COMMUNITY)
Admission: EM | Admit: 2022-12-30 | Discharge: 2023-01-06 | DRG: 064 | Disposition: E | Payer: Medicare PPO | Attending: Neurology | Admitting: Neurology

## 2022-12-30 ENCOUNTER — Emergency Department (HOSPITAL_COMMUNITY): Payer: Medicare PPO

## 2022-12-30 DIAGNOSIS — I629 Nontraumatic intracranial hemorrhage, unspecified: Secondary | ICD-10-CM | POA: Diagnosis not present

## 2022-12-30 DIAGNOSIS — K219 Gastro-esophageal reflux disease without esophagitis: Secondary | ICD-10-CM | POA: Diagnosis present

## 2022-12-30 DIAGNOSIS — E854 Organ-limited amyloidosis: Secondary | ICD-10-CM | POA: Diagnosis present

## 2022-12-30 DIAGNOSIS — Z803 Family history of malignant neoplasm of breast: Secondary | ICD-10-CM

## 2022-12-30 DIAGNOSIS — R29810 Facial weakness: Secondary | ICD-10-CM | POA: Diagnosis present

## 2022-12-30 DIAGNOSIS — H53462 Homonymous bilateral field defects, left side: Secondary | ICD-10-CM | POA: Diagnosis present

## 2022-12-30 DIAGNOSIS — I6201 Nontraumatic acute subdural hemorrhage: Secondary | ICD-10-CM | POA: Diagnosis present

## 2022-12-30 DIAGNOSIS — I739 Peripheral vascular disease, unspecified: Secondary | ICD-10-CM

## 2022-12-30 DIAGNOSIS — E785 Hyperlipidemia, unspecified: Secondary | ICD-10-CM | POA: Diagnosis present

## 2022-12-30 DIAGNOSIS — R29715 NIHSS score 15: Secondary | ICD-10-CM | POA: Diagnosis present

## 2022-12-30 DIAGNOSIS — G936 Cerebral edema: Secondary | ICD-10-CM | POA: Diagnosis present

## 2022-12-30 DIAGNOSIS — I358 Other nonrheumatic aortic valve disorders: Secondary | ICD-10-CM | POA: Diagnosis present

## 2022-12-30 DIAGNOSIS — E669 Obesity, unspecified: Secondary | ICD-10-CM | POA: Diagnosis present

## 2022-12-30 DIAGNOSIS — Z87891 Personal history of nicotine dependence: Secondary | ICD-10-CM

## 2022-12-30 DIAGNOSIS — Z7189 Other specified counseling: Secondary | ICD-10-CM | POA: Diagnosis not present

## 2022-12-30 DIAGNOSIS — I680 Cerebral amyloid angiopathy: Secondary | ICD-10-CM | POA: Diagnosis present

## 2022-12-30 DIAGNOSIS — H518 Other specified disorders of binocular movement: Secondary | ICD-10-CM | POA: Diagnosis present

## 2022-12-30 DIAGNOSIS — Z515 Encounter for palliative care: Secondary | ICD-10-CM

## 2022-12-30 DIAGNOSIS — I161 Hypertensive emergency: Secondary | ICD-10-CM | POA: Diagnosis not present

## 2022-12-30 DIAGNOSIS — Z6830 Body mass index (BMI) 30.0-30.9, adult: Secondary | ICD-10-CM

## 2022-12-30 DIAGNOSIS — I609 Nontraumatic subarachnoid hemorrhage, unspecified: Secondary | ICD-10-CM | POA: Diagnosis present

## 2022-12-30 DIAGNOSIS — R0603 Acute respiratory distress: Secondary | ICD-10-CM | POA: Diagnosis not present

## 2022-12-30 DIAGNOSIS — I615 Nontraumatic intracerebral hemorrhage, intraventricular: Principal | ICD-10-CM | POA: Diagnosis present

## 2022-12-30 DIAGNOSIS — I611 Nontraumatic intracerebral hemorrhage in hemisphere, cortical: Secondary | ICD-10-CM | POA: Diagnosis present

## 2022-12-30 DIAGNOSIS — R1310 Dysphagia, unspecified: Secondary | ICD-10-CM | POA: Diagnosis present

## 2022-12-30 DIAGNOSIS — G935 Compression of brain: Secondary | ICD-10-CM | POA: Diagnosis not present

## 2022-12-30 DIAGNOSIS — Z66 Do not resuscitate: Secondary | ICD-10-CM | POA: Diagnosis not present

## 2022-12-30 DIAGNOSIS — G8194 Hemiplegia, unspecified affecting left nondominant side: Secondary | ICD-10-CM | POA: Diagnosis present

## 2022-12-30 DIAGNOSIS — Z8673 Personal history of transient ischemic attack (TIA), and cerebral infarction without residual deficits: Secondary | ICD-10-CM

## 2022-12-30 DIAGNOSIS — I2510 Atherosclerotic heart disease of native coronary artery without angina pectoris: Secondary | ICD-10-CM | POA: Diagnosis present

## 2022-12-30 DIAGNOSIS — E870 Hyperosmolality and hypernatremia: Secondary | ICD-10-CM | POA: Diagnosis not present

## 2022-12-30 DIAGNOSIS — R34 Anuria and oliguria: Secondary | ICD-10-CM | POA: Diagnosis not present

## 2022-12-30 DIAGNOSIS — I10 Essential (primary) hypertension: Secondary | ICD-10-CM

## 2022-12-30 DIAGNOSIS — I6389 Other cerebral infarction: Secondary | ICD-10-CM | POA: Diagnosis not present

## 2022-12-30 DIAGNOSIS — R451 Restlessness and agitation: Secondary | ICD-10-CM | POA: Diagnosis not present

## 2022-12-30 DIAGNOSIS — Z7982 Long term (current) use of aspirin: Secondary | ICD-10-CM

## 2022-12-30 DIAGNOSIS — Z955 Presence of coronary angioplasty implant and graft: Secondary | ICD-10-CM

## 2022-12-30 DIAGNOSIS — Z79899 Other long term (current) drug therapy: Secondary | ICD-10-CM

## 2022-12-30 DIAGNOSIS — E876 Hypokalemia: Secondary | ICD-10-CM | POA: Diagnosis present

## 2022-12-30 DIAGNOSIS — I616 Nontraumatic intracerebral hemorrhage, multiple localized: Secondary | ICD-10-CM

## 2022-12-30 DIAGNOSIS — R482 Apraxia: Secondary | ICD-10-CM | POA: Diagnosis present

## 2022-12-30 DIAGNOSIS — R400 Somnolence: Secondary | ICD-10-CM | POA: Diagnosis not present

## 2022-12-30 DIAGNOSIS — I619 Nontraumatic intracerebral hemorrhage, unspecified: Secondary | ICD-10-CM | POA: Diagnosis present

## 2022-12-30 DIAGNOSIS — Z8249 Family history of ischemic heart disease and other diseases of the circulatory system: Secondary | ICD-10-CM

## 2022-12-30 DIAGNOSIS — R339 Retention of urine, unspecified: Secondary | ICD-10-CM | POA: Diagnosis not present

## 2022-12-30 LAB — DIFFERENTIAL
Abs Immature Granulocytes: 0.06 10*3/uL (ref 0.00–0.07)
Basophils Absolute: 0.1 10*3/uL (ref 0.0–0.1)
Basophils Relative: 1 %
Eosinophils Absolute: 0 10*3/uL (ref 0.0–0.5)
Eosinophils Relative: 0 %
Immature Granulocytes: 1 %
Lymphocytes Relative: 12 %
Lymphs Abs: 1.6 10*3/uL (ref 0.7–4.0)
Monocytes Absolute: 0.8 10*3/uL (ref 0.1–1.0)
Monocytes Relative: 6 %
Neutro Abs: 10.3 10*3/uL — ABNORMAL HIGH (ref 1.7–7.7)
Neutrophils Relative %: 80 %

## 2022-12-30 LAB — COMPREHENSIVE METABOLIC PANEL
ALT: 20 U/L (ref 0–44)
AST: 25 U/L (ref 15–41)
Albumin: 3.8 g/dL (ref 3.5–5.0)
Alkaline Phosphatase: 59 U/L (ref 38–126)
Anion gap: 14 (ref 5–15)
BUN: 20 mg/dL (ref 8–23)
CO2: 20 mmol/L — ABNORMAL LOW (ref 22–32)
Calcium: 9.3 mg/dL (ref 8.9–10.3)
Chloride: 103 mmol/L (ref 98–111)
Creatinine, Ser: 1.19 mg/dL (ref 0.61–1.24)
GFR, Estimated: 60 mL/min — ABNORMAL LOW (ref >60)
Glucose, Bld: 164 mg/dL — ABNORMAL HIGH (ref 70–99)
Potassium: 4.1 mmol/L (ref 3.5–5.1)
Sodium: 137 mmol/L (ref 135–145)
Total Bilirubin: 0.9 mg/dL (ref 0.3–1.2)
Total Protein: 6.8 g/dL (ref 6.5–8.1)

## 2022-12-30 LAB — I-STAT CHEM 8, ED
BUN: 21 mg/dL (ref 8–23)
Calcium, Ion: 1.07 mmol/L — ABNORMAL LOW (ref 1.15–1.40)
Chloride: 108 mmol/L (ref 98–111)
Creatinine, Ser: 1.1 mg/dL (ref 0.61–1.24)
Glucose, Bld: 156 mg/dL — ABNORMAL HIGH (ref 70–99)
HCT: 39 % (ref 39.0–52.0)
Hemoglobin: 13.3 g/dL (ref 13.0–17.0)
Potassium: 4.2 mmol/L (ref 3.5–5.1)
Sodium: 140 mmol/L (ref 135–145)
TCO2: 20 mmol/L — ABNORMAL LOW (ref 22–32)

## 2022-12-30 LAB — CBC
HCT: 38.6 % — ABNORMAL LOW (ref 39.0–52.0)
Hemoglobin: 13 g/dL (ref 13.0–17.0)
MCH: 32.9 pg (ref 26.0–34.0)
MCHC: 33.7 g/dL (ref 30.0–36.0)
MCV: 97.7 fL (ref 80.0–100.0)
Platelets: 211 10*3/uL (ref 150–400)
RBC: 3.95 MIL/uL — ABNORMAL LOW (ref 4.22–5.81)
RDW: 13.5 % (ref 11.5–15.5)
WBC: 12.8 10*3/uL — ABNORMAL HIGH (ref 4.0–10.5)
nRBC: 0 % (ref 0.0–0.2)

## 2022-12-30 LAB — PROTIME-INR
INR: 1.1 (ref 0.8–1.2)
Prothrombin Time: 13.9 seconds (ref 11.4–15.2)

## 2022-12-30 LAB — APTT: aPTT: 24 seconds (ref 24–36)

## 2022-12-30 LAB — ETHANOL
Alcohol, Ethyl (B): <10 mg/dL (ref <10)
Alcohol, Ethyl (B): <10 mg/dL (ref <10)

## 2022-12-30 LAB — CBG MONITORING, ED: Glucose-Capillary: 164 mg/dL — ABNORMAL HIGH (ref 70–99)

## 2022-12-30 LAB — SODIUM: Sodium: 134 mmol/L — ABNORMAL LOW (ref 135–145)

## 2022-12-30 MED ORDER — SIMVASTATIN 20 MG PO TABS: 40.0000 mg | ORAL_TABLET | Freq: Every day | ORAL | Status: DC

## 2022-12-30 MED ORDER — ADULT MULTIVITAMIN W/MINERALS CH
1.0000 | ORAL_TABLET | Freq: Every day | ORAL | Status: DC
  Administered 2022-12-31: 1 via ORAL
  Filled 2022-12-30 (×2): qty 1

## 2022-12-30 MED ORDER — ONDANSETRON HCL 4 MG/2ML IJ SOLN
4.0000 mg | Freq: Four times a day (QID) | INTRAMUSCULAR | Status: DC | PRN
  Administered 2022-12-31: 4 mg via INTRAVENOUS
  Filled 2022-12-30: qty 2

## 2022-12-30 MED ORDER — HYDROMORPHONE HCL 1 MG/ML IJ SOLN
0.5000 mg | Freq: Once | INTRAMUSCULAR | Status: AC
  Administered 2022-12-31: 0.5 mg via INTRAVENOUS
  Filled 2022-12-30 (×2): qty 1

## 2022-12-30 MED ORDER — PANTOPRAZOLE SODIUM 40 MG IV SOLR: 40.0000 mg | Freq: Every day | INTRAVENOUS | Status: DC

## 2022-12-30 MED ORDER — STROKE: EARLY STAGES OF RECOVERY BOOK
Freq: Once | Status: AC
  Filled 2022-12-30: qty 1

## 2022-12-30 MED ORDER — PANTOPRAZOLE SODIUM 40 MG PO TBEC
40.0000 mg | DELAYED_RELEASE_TABLET | Freq: Every day | ORAL | Status: DC
  Filled 2022-12-30: qty 1

## 2022-12-30 MED ORDER — ORAL CARE MOUTH RINSE
15.0000 mL | OROMUCOSAL | Status: DC
  Administered 2022-12-31 – 2023-01-04 (×19): 15 mL via OROMUCOSAL

## 2022-12-30 MED ORDER — IOHEXOL 350 MG/ML SOLN
75.0000 mL | Freq: Once | INTRAVENOUS | Status: AC | PRN
  Administered 2022-12-30: 75 mL via INTRAVENOUS

## 2022-12-30 MED ORDER — SENNOSIDES-DOCUSATE SODIUM 8.6-50 MG PO TABS
1.0000 | ORAL_TABLET | Freq: Two times a day (BID) | ORAL | Status: DC
  Administered 2022-12-31: 1 via ORAL
  Filled 2022-12-30 (×2): qty 1

## 2022-12-30 MED ORDER — ACETAMINOPHEN 160 MG/5ML PO SOLN
650.0000 mg | ORAL | Status: DC | PRN
  Administered 2023-01-01 – 2023-01-04 (×5): 650 mg
  Filled 2022-12-30 (×5): qty 20.3

## 2022-12-30 MED ORDER — CHLORHEXIDINE GLUCONATE CLOTH 2 % EX PADS
6.0000 | MEDICATED_PAD | Freq: Every day | CUTANEOUS | Status: DC
  Administered 2022-12-31 – 2023-01-04 (×5): 6 via TOPICAL

## 2022-12-30 MED ORDER — ACETAMINOPHEN 325 MG PO TABS
650.0000 mg | ORAL_TABLET | ORAL | Status: DC | PRN
  Administered 2022-12-31: 650 mg via ORAL
  Filled 2022-12-30 (×2): qty 2

## 2022-12-30 MED ORDER — ACETAMINOPHEN 650 MG RE SUPP
650.0000 mg | RECTAL | Status: DC | PRN
  Administered 2022-12-31 – 2023-01-03 (×3): 650 mg via RECTAL
  Filled 2022-12-30 (×3): qty 1

## 2022-12-30 MED ORDER — SODIUM CHLORIDE 3 % IV BOLUS
250.0000 mL | Freq: Once | INTRAVENOUS | Status: AC
  Administered 2022-12-30: 250 mL via INTRAVENOUS
  Filled 2022-12-30 (×2): qty 500

## 2022-12-30 MED ORDER — METOCLOPRAMIDE HCL 5 MG/ML IJ SOLN
10.0000 mg | Freq: Once | INTRAMUSCULAR | Status: AC
  Administered 2022-12-30: 10 mg via INTRAVENOUS
  Filled 2022-12-30: qty 2

## 2022-12-30 MED ORDER — SODIUM CHLORIDE 3 % IV SOLN
INTRAVENOUS | Status: DC
  Filled 2022-12-30 (×5): qty 500

## 2022-12-30 MED ORDER — ORAL CARE MOUTH RINSE: 15.0000 mL | OROMUCOSAL | Status: DC | PRN

## 2022-12-30 MED ORDER — ONDANSETRON HCL 4 MG/2ML IJ SOLN
4.0000 mg | Freq: Once | INTRAMUSCULAR | Status: AC
  Administered 2022-12-30: 4 mg via INTRAVENOUS

## 2022-12-30 MED ORDER — SODIUM CHLORIDE 0.9% FLUSH
3.0000 mL | Freq: Once | INTRAVENOUS | Status: AC
  Administered 2022-12-30: 3 mL via INTRAVENOUS

## 2022-12-30 MED ORDER — CLEVIDIPINE BUTYRATE 0.5 MG/ML IV EMUL
0.0000 mg/h | INTRAVENOUS | Status: DC
  Administered 2022-12-31 (×2): 2 mg/h via INTRAVENOUS
  Administered 2022-12-31: 4 mg/h via INTRAVENOUS
  Administered 2023-01-01 (×2): 8 mg/h via INTRAVENOUS
  Administered 2023-01-01: 2 mg/h via INTRAVENOUS
  Administered 2023-01-01: 6 mg/h via INTRAVENOUS
  Filled 2022-12-30 (×7): qty 50

## 2022-12-30 NOTE — H&P (Addendum)
NEUROLOGY H + P NOTE   Date of service: December 30, 2022 Patient Name: [name] Marks MRN:  [id number] DOB:  Feb 20, 1935  _ _ _   _ __   _ __ _ _  __ __   _ __   __ _  History of Present Illness  [name] Marks is a 88 y.o. male with PMH significant for HTN, peripheral vascular disease, prior L occipital lobe ICH, SDH who presents with headache and L sided weakness.  Patient lives with his wife, daughter lives 20 mins away. Wife left to get some lunch in the afternoon. Patient developed a R sided headache while the wife was gone. Not unusual for him to have a headache. Wife got back at 3 and gave him some tylenol and patient went to bed to rest. Around 6pm, woke up and still complaining of head hurting. Wife called his daughter who came in and did a stroke scale and found him weak on the left and called EMS.  He was brought in as a code stroke for L facial droop, L sided weakness and R gaze deviation.  STAT CT Head demonstrates 37ml, multiloculated large R temporal lobe hematoma with R IVH, 106mm midline shift along with small overlying SDH.  Patient denies any fall, did not hit his head. He only takes aspirin at home and took some this AM. History is limited from patient but he does endorse R temporoparietal throbbing headache with nausea and vomiting.  LKW: [id number] PM on 12/30/22. mRS: 1 tNKASE: not offered, 2/2 ICH and prior hx of ICH. Thrombectomy: not offered, 2/2 ICH and no LVO, no spot sign. ICH score: 3(age > 80, ICH vol > 30 with IVH) NIHSS components Score: Comment  1a Level of Conscious 0[x]  1[]  2[]  3[]      1b LOC Questions 0[x]  1[]  2[]       1c LOC Commands 0[x]  1[]  2[]       2 Best Gaze 0[]  1[]  2[x]       3 Visual 0[]  1[]  2[x]  3[]      4 Facial Palsy 0[]  1[x]  2[]  3[]      5a Motor Arm - left 0[]  1[]  2[x]  3[]  4[]  UN[]    5b Motor Arm - Right 0[x]  1[]  2[]  3[]  4[]  UN[]    6a Motor Leg - Left 0[]  1[]  2[]  3[x]  4[]  UN[]    6b Motor Leg - Right 0[x]  1[]  2[]  3[]  4[]  UN[]    7 Limb Ataxia 0[x]   1[]  2[]  3[]  UN[]     8 Sensory 0[]  1[]  2[x]  UN[]      9 Best Language 0[x]  1[]  2[]  3[]      10 Dysarthria 0[]  1[x]  2[]  UN[]      11 Extinct. and Inattention 0[]  1[x]  2[]       TOTAL: 14      ROS   Constitutional Denies weight loss, fever and chills.   HEENT +changes in vision,  hard of hearing.  Respiratory Denies SOB and cough.   CV Denies palpitations and CP   GI Denies abdominal pain, nausea, vomiting and diarrhea.   GU Denies dysuria and urinary frequency.  MSK Denies myalgia and joint pain.   Skin Denies rash and pruritus.   Neurological + headache but no syncope.   Psychiatric Denies recent changes in mood. Denies anxiety and depression.   Past History   Past Medical History:  Diagnosis Date   Cerebrovascular disease    DJD (degenerative joint disease)    History of echocardiogram    Echo 5/22: EF 60-65,  no RWMA, mild MR, trivial AI, mild-moderate AV sclerosis without stenosis   Hyperlipidemia    Hypertension    Peripheral vascular disease ([place])    Polyposis of colon    Subdural hematoma ([place])    history of    Past Surgical History:  Procedure Laterality Date   CARDIAC CATHETERIZATION  03/21/2010   Patent LAD (coronary artery) stents - No significant obstructive disease in the left circumflex -- Patent RCA with moderately severe ostial stenosis of the posterior descending (coronary) artery branch.   CARDIAC CATHETERIZATION  08/01/2009    IR ANGIO INTRA EXTRACRAN SEL COM CAROTID INNOMINATE BILAT MOD SED  09/14/2019   IR ANGIO VERTEBRAL SEL SUBCLAVIAN INNOMINATE UNI L MOD SED  09/14/2019   IR ANGIO VERTEBRAL SEL VERTEBRAL UNI R MOD SED  09/14/2019   IR US GUIDE VASC ACCESS RIGHT  09/14/2019   LEFT HEART CATHETERIZATION WITH CORONARY ANGIOGRAM N/A 11/08/2013   Procedure: LEFT HEART CATHETERIZATION WITH CORONARY ANGIOGRAM;  Surgeon: Blane Ohara, MD;  Location: [facility] CATH LAB;  Service: Cardiovascular;  Laterality: N/A;   Family History  Problem Relation Age of Onset   Heart  attack Father    Heart attack Sister    Breast cancer Sister    Breast cancer Sister    Heart disease Brother    Social History   Socioeconomic History   Marital status: Married    Spouse name: Not on file   Number of children: Not on file   Years of education: Not on file   Highest education level: Not on file  Occupational History   Not on file  Tobacco Use   Smoking status: Former   Smokeless tobacco: Former    Types: Chew    Quit date: 09/09/1971  Vaping Use   Vaping Use: Never used  Substance and Sexual Activity   Alcohol use: Never   Drug use: Never   Sexual activity: Not on file  Other Topics Concern   Not on file  Social History Narrative   Not on file   Social Determinants of Health   Financial Resource Strain: Not on file  Food Insecurity: Not on file  Transportation Needs: Not on file  Physical Activity: Not on file  Stress: Not on file  Social Connections: Not on file   No Known Allergies  Medications  (Not in a hospital admission)    Vitals   Vitals:   12/30/22 2101 12/30/22 2102 12/30/22 2105 12/30/22 2115  BP:    126/69  Pulse:    79  Resp:    15  Temp:   98 F (36.7 C)   SpO2: 99%   100%  Weight:      Height:  5\' 10"  (1.778 m)       Body mass index is 30.94 kg/m.  Physical Exam   General: Laying comfortably in bed; in no acute distress.  HENT: Normal oropharynx and mucosa. Normal external appearance of ears and nose.  Neck: Supple, no pain or tenderness  CV: No JVD. No peripheral edema.  Pulmonary: Symmetric Chest rise. Normal respiratory effort.  Abdomen: Soft to touch, non-tender.  Ext: No cyanosis, edema, or deformity  Skin: No rash. Normal palpation of skin.   Musculoskeletal: Normal digits and nails by inspection. No clubbing.   Neurologic Examination  Mental status/Cognition: Alert, oriented to self, month but not to place and year, poor attention.  Speech/language: slightly dysarthric speech, fluent, comprehension  intact, object naming intact, repetition intact.  Cranial nerves:  CN II Pupils equal and reactive to light, L hemianopsia.   CN III,IV,VI R gaze preference, does not cross midline.   CN V normal sensation in V1, V2, and V3 segments bilaterally   CN VII L facial droop   CN VIII Does not turn head towards left to voice. Makes eye contact to voice, hard of hearing.   CN IX & X normal palatal elevation, no uvular deviation   CN XI Does not turn head towards left.   CN XII midline tongue protrusion   Motor:  Muscle bulk: normal, tone decreased in LUE compared to other extremities. Mvmt Root Nerve  Muscle Right Left Comments  SA C5/6 Ax Deltoid  3   EF C5/6 Mc Biceps 5 3   EE C6/7/8 Rad Triceps 5 2   WF C6/7 Med FCR     WE C7/8 PIN ECU     F Ab C8/T1 U ADM/FDI 5 2   HF L1/2/3 Fem Illopsoas 5 2   KE L2/3/4 Fem Quad     DF L4/5 D Peron Tib Ant 5 1   PF S1/2 Tibial Grc/Sol 5 1    Sensation:  Light touch Decreased in LUE and LLE vs extinction.   Pin prick    Temperature    Vibration   Proprioception    Coordination/Complex Motor:  - Finger to Nose intact on the right, unable to do with LUE - Heel to shin unable to do - Rapid alternating movement are slowed on the right, unable to do with left. - Gait: deferred for patient safety.  Labs   CBC:  Recent Labs  Lab 12/30/22 2027 12/30/22 2032  WBC 12.8*  --   NEUTROABS 10.3*  --   HGB 13.0 13.3  HCT 38.6* 39.0  MCV 97.7  --   PLT 211  --     Basic Metabolic Panel:  Lab Results  Component Value Date   NA 140 12/30/2022   K 4.2 12/30/2022   CO2 20 (L) 12/30/2022   GLUCOSE 156 (H) 12/30/2022   BUN 21 12/30/2022   CREATININE 1.10 12/30/2022   CALCIUM 9.3 12/30/2022   GFRNONAA 60 (L) 12/30/2022   GFRAA >60 09/14/2019   Lipid Panel:  Lab Results  Component Value Date   LDLCALC 57 05/21/2019   HgbA1c:  Lab Results  Component Value Date   HGBA1C 5.8 (H) 05/21/2019   Urine Drug Screen: No results found for:  "LABOPIA", "COCAINSCRNUR", "LABBENZ", "AMPHETMU", "THCU", "LABBARB"  Alcohol Level     Component Value Date/Time   ETH <10 12/30/2022 2027    CT Head without contrast(Personally reviewed): 1. Large intraparenchymal hematoma centered in the right temporal lobe, measuring 6.2 x 3.2 x 3.5 cm (volume 35 mL). 2. Intraventricular extension into the right lateral ventricle. 3. Leftward midline shift of 6 mm.  CT angio Head and Neck with contrast(Personally reviewed): No LVO, no spot sign, no AVM.  CT venogram head: pending  MRI Brain: Pending   Impression   [name] Marks is a 88 y.o. male with PMH significant for HTN, peripheral vascular disease, prior L occipital lobe ICH, SDH who presents with headache and L sided weakness. He was found to have 50ml, multiloculated large R temporal lobe hematoma with R IVH, 59mm midline shift along with small overlying SDH.  No trauma, not on AC. No obvious amyloidosis noted on prior MRI from 2020. Was hypertensive to 190s at one point but BP spontaneously down to [id number] systolic prior to any intervention.  Recommendations  Right temporal lobe ICH with IVH with brain compression and midline shift of brain: - Admit to ICU - Stability scan in 6 hours or STAT with any neurological decline - Frequent neuro checks; q69min for 1 hour, then q1hour - No antiplatelets or anticoagulants due to [place] - SCD for DVT prophylaxis, pharmacological DVT ppx at 24 hours if ICH is stable - Blood pressure control with goal systolic [id number] - [id number], cleverplex and labetalol PRN - Stroke labs, HgbA1c, fasting lipid panel - MRI brain with and without contrast when stabilized to evaluate for underlying mass - CT Venogram head to evaluate for CVST. - Hypertonic saline 260ml bolus, followed by 46ml/Hr with goal sodium of 150-155. - will consider neurosurgery eval if he deteriorates overnight. - Risk factor modification - Echocardiogram - PT consult, OT consult, Speech consult. -  Stroke team to follow  Hypertension: Goal SBP as above, PRN labetalol and cleviprex for SBP above goal. Hold home meds overnight.  GERD: - continue home PPI.  HLD: - continue home simvastatin.  Peripheral vascular disease: - no antiplatelets 2/2 ICH.  Verified allergies with wife and daughters and patient is not allergic to anythinig that they know of. Also verified code status with wife and daughters and patient is FULL CODE but they would not want him on the vent long term, short term is okay. ______________________________________________________________________  This patient is critically ill and at significant risk of neurological worsening, death and care requires constant monitoring of vital signs, hemodynamics,respiratory and cardiac monitoring, neurological assessment, discussion with family, other specialists and medical decision making of high complexity. I spent 110 minutes of neurocritical care time  in the care of  this patient. This was time spent independent of any time provided by nurse practitioner or PA.  Donnetta Simpers [facility] Pager Number [phone] 12/30/2022  9:48 PM  Thank you for the opportunity to take part in the care of this patient. If you have any further questions, please contact the neurology consultation attending.  Signed,  [place] Pager Number [phone] _ _ _   _ __   _ __ _ _  __ __   _ __   __ _

## 2022-12-30 NOTE — ED Provider Notes (Signed)
[place] Provider Note   CSN: [id number] Arrival date & time: 12/30/22  2023  An emergency department physician performed an initial assessment on this suspected stroke patient at 2023.  History  Chief Complaint  Patient presents with   Code Stroke    [name] Marks is a 88 y.o. male presented to ED with headaches, confusion, left-sided weakness.  Onset of headache earlier today.  Wife went out shopping.  Patient was confused on her return.  Patient arrives as a code stroke with left-sided weakness and right-sided gaze deviation.  No reported falls or trauma.  Patient is reporting a headache  HPI     Home Medications Prior to Admission medications   Medication Sig Start Date End Date Taking? Authorizing Provider  acetaminophen (TYLENOL) 325 MG tablet Take 2 tablets (650 mg total) by mouth every 6 (six) hours as needed for headache. 05/23/19   Rinehuls, Early Chars, PA-C  aspirin EC 81 MG tablet Take 81 mg by mouth daily. Swallow whole.    [provider]  azelastine (ASTELIN) 0.1 % nasal spray Place into both nostrils as needed. 09/03/21   [provider]  cholecalciferol (VITAMIN D) 1000 UNITS tablet Take 1,000 Units by mouth 2 (two) times daily.     [provider]  guaiFENesin (MUCINEX) 600 MG 12 hr tablet Take 600 mg by mouth 2 (two) times daily as needed for cough.    [provider]  isosorbide mononitrate (IMDUR) 60 MG 24 hr tablet TAKE 1 TABLET(60 MG) BY MOUTH DAILY 04/04/22   Richardson Dopp T, PA-C  loratadine (CLARITIN) 10 MG tablet Take 10 mg by mouth daily as needed for allergies.    [provider]  losartan (COZAAR) 100 MG tablet Take 100 mg by mouth daily.     [provider]  metoprolol tartrate (LOPRESSOR) 25 MG tablet TAKE 1 TABLET(25 MG) BY MOUTH TWICE DAILY 02/21/22   Sherren Mocha, MD  Multiple Vitamins-Minerals (PRESERVISION AREDS PO) Take 1 tablet by mouth 2 (two)  times daily.     [provider]  omeprazole (PRILOSEC) 20 MG capsule Take 1 capsule (20 mg total) by mouth daily. 01/18/22   Charlesetta Shanks, MD  simvastatin (ZOCOR) 40 MG tablet TAKE 1 TABLET BY MOUTH EVERY NIGHT AT BEDTIME 12/21/19   Sherren Mocha, MD      Allergies    Patient has no known allergies.    Review of Systems   Review of Systems  Physical Exam Updated Vital Signs BP 138/71   Pulse 88   Temp 98.1 F (36.7 C) (Oral)   Resp 14   Ht 5\' 10"  (1.778 m)   Wt 97.8 kg   SpO2 98%   BMI 30.94 kg/m  Physical Exam Constitutional:      General: He is not in acute distress. HENT:     Head: Normocephalic and atraumatic.  Eyes:     Conjunctiva/sclera: Conjunctivae normal.     Pupils: Pupils are equal, round, and reactive to light.  Cardiovascular:     Rate and Rhythm: Normal rate and regular rhythm.  Pulmonary:     Effort: Pulmonary effort is normal. No respiratory distress.  Abdominal:     General: There is no distension.     Tenderness: There is no abdominal tenderness.  Skin:    General: Skin is warm and dry.  Neurological:     Mental Status: He is alert.     Comments: Slow speech, left-sided  weakness, right-sided gaze deviation     ED Results / Procedures / Treatments   Labs (all labs ordered are listed, but only abnormal results are displayed) Labs Reviewed  CBC - Abnormal; Notable for the following components:      Result Value   WBC 12.8 (*)    RBC 3.95 (*)    HCT 38.6 (*)    All other components within normal limits  DIFFERENTIAL - Abnormal; Notable for the following components:   Neutro Abs 10.3 (*)    All other components within normal limits  COMPREHENSIVE METABOLIC PANEL - Abnormal; Notable for the following components:   CO2 20 (*)    Glucose, Bld 164 (*)    GFR, Estimated 60 (*)    All other components within normal limits  CBC - Abnormal; Notable for the following components:   WBC 13.0 (*)    RBC 3.77 (*)    Hemoglobin 12.3 (*)     HCT 37.5 (*)    All other components within normal limits  COMPREHENSIVE METABOLIC PANEL - Abnormal; Notable for the following components:   CO2 20 (*)    Glucose, Bld 184 (*)    Calcium 8.6 (*)    GFR, Estimated 57 (*)    All other components within normal limits  SODIUM - Abnormal; Notable for the following components:   Sodium 134 (*)    All other components within normal limits  I-STAT CHEM 8, ED - Abnormal; Notable for the following components:   Glucose, Bld 156 (*)    Calcium, Ion 1.07 (*)    TCO2 20 (*)    All other components within normal limits  CBG MONITORING, ED - Abnormal; Notable for the following components:   Glucose-Capillary 164 (*)    All other components within normal limits  MRSA NEXT GEN BY PCR, NASAL  PROTIME-INR  APTT  ETHANOL  ETHANOL  RAPID URINE DRUG SCREEN, HOSP PERFORMED  SODIUM  SODIUM  SODIUM  HEMOGLOBIN A1C    EKG EKG Interpretation  Date/Time:  Monday December 30 2022 20:59:23 EDT Ventricular Rate:  88 PR Interval:  234 QRS Duration: 104 QT Interval:  367 QTC Calculation: 444 R Axis:   -17 Text Interpretation: Sinus rhythm Prolonged PR interval LVH with secondary repolarization abnormality Confirmed by Octaviano Glow [id number]) on 12/30/2022 9:06:24 PM  Radiology CT HEAD WO CONTRAST  Result Date: 12/31/2022 CLINICAL DATA:  Hemorrhagic stroke follow-up EXAM: CT HEAD WITHOUT CONTRAST TECHNIQUE: Contiguous axial images were obtained from the base of the skull through the vertex without intravenous contrast. RADIATION DOSE REDUCTION: This exam was performed according to the departmental dose-optimization program which includes automated exposure control, adjustment of the mA and/or kV according to patient size and/or use of iterative reconstruction technique. COMPARISON:  12/30/2022 FINDINGS: Brain: Unchanged appearance of large intraparenchymal hematoma centered in the right temporal lobe. Small volume overlying subarachnoid and subdural  blood. There is now a small amount of subarachnoid blood visible over the posterior left hemisphere. Blood within both lateral ventricles, slightly increased. Unchanged 5 mm leftward midline shift. Vascular: Atherosclerotic calcification of the internal carotid arteries at the skull base. Skull: Normal. Negative for fracture or focal lesion. Sinuses/Orbits: No acute finding. Other: None. IMPRESSION: 1. Unchanged appearance of large intraparenchymal hematoma centered in the right temporal lobe with small volume overlying subarachnoid and subdural blood. 2. There is now a small amount of subarachnoid blood visible over the posterior left hemisphere. 3. Blood within both lateral ventricles, slightly increased. 4. Unchanged  5 mm leftward midline shift. Electronically Signed   By: Ulyses Jarred M.D.   On: 12/31/2022 03:54   MR BRAIN W WO CONTRAST  Result Date: 12/31/2022 CLINICAL DATA:  Transient ischemic attack EXAM: MRI HEAD WITHOUT AND WITH CONTRAST TECHNIQUE: Multiplanar, multiecho pulse sequences of the brain and surrounding structures were obtained without and with intravenous contrast. CONTRAST:  9.71mL GADAVIST GADOBUTROL 1 MMOL/ML IV SOLN COMPARISON:  08/11/2019 FINDINGS: Brain: Large area of hemorrhage in the right temporal lobe with overlying subdural and subarachnoid blood. There is blood in the right lateral ventricle, unchanged. Unchanged 5 mm leftward midline shift. 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 There is stippled contrast enhancement in the right temporal lobe, possibly due to blood brain barrier breakdown following hemorrhage. The midline structures are normal. Vascular: Normal flow voids. Skull and upper cervical spine: Normal marrow signal. Sinuses/Orbits: Negative. Other: None. IMPRESSION: 1. Unchanged large area of hemorrhage in the right temporal lobe with overlying subdural and subarachnoid blood. 2. Unchanged 5 mm leftward midline shift. 3. Stippled contrast enhancement in the right  temporal lobe, possibly due to blood brain barrier breakdown following hemorrhage. 4. 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 Electronically Signed   By: Ulyses Jarred M.D.   On: 12/31/2022 03:48   CT VENOGRAM HEAD  Result Date: 12/30/2022 CLINICAL DATA:  Hemorrhagic stroke EXAM: CT VENOGRAM HEAD TECHNIQUE: Venographic phase images of the brain were obtained following the administration of intravenous contrast. Multiplanar reformats and maximum intensity projections were generated. RADIATION DOSE REDUCTION: This exam was performed according to the departmental dose-optimization program which includes automated exposure control, adjustment of the mA and/or kV according to patient size and/or use of iterative reconstruction technique. CONTRAST:  38mL OMNIPAQUE IOHEXOL 350 MG/ML SOLN COMPARISON:  None Available. FINDINGS: Superior sagittal sinus: Normal. Straight sinus: Normal. Inferior sagittal sinus, vein of Galen and internal cerebral veins: Normal. Transverse sinuses: Normal. Sigmoid sinuses: Normal. Visualized jugular veins: Normal. IMPRESSION: No dural venous sinus thrombosis. Electronically Signed   By: Ulyses Jarred M.D.   On: 12/30/2022 22:30   CT ANGIO HEAD NECK W WO CM (CODE STROKE)  Result Date: 12/30/2022 CLINICAL DATA:  Left-sided weakness and facial EXAM: CT ANGIOGRAPHY HEAD AND NECK TECHNIQUE: Multidetector CT imaging of the head and neck was performed using the standard protocol during bolus administration of intravenous contrast. Multiplanar CT image reconstructions and MIPs were obtained to evaluate the vascular anatomy. Carotid stenosis measurements (when applicable) are obtained utilizing NASCET criteria, using the distal internal carotid diameter as the denominator. RADIATION DOSE REDUCTION: This exam was performed according to the departmental dose-optimization program which includes automated exposure control, adjustment of the mA and/or kV according to patient size and/or use of  iterative reconstruction technique. CONTRAST:  66mL OMNIPAQUE IOHEXOL 350 MG/ML SOLN COMPARISON:  None Available. FINDINGS: CTA NECK FINDINGS SKELETON: There is no bony spinal canal stenosis. No lytic or blastic lesion. OTHER NECK: Normal pharynx, larynx and major salivary glands. No cervical lymphadenopathy. Unremarkable thyroid gland. UPPER CHEST: No pneumothorax or pleural effusion. No nodules or masses. AORTIC ARCH: There is calcific atherosclerosis of the aortic arch. There is no aneurysm, dissection or hemodynamically significant stenosis of the visualized portion of the aorta. Conventional 3 vessel aortic branching pattern. The visualized proximal subclavian arteries are widely patent. RIGHT CAROTID SYSTEM: No dissection, occlusion or aneurysm. Mild atherosclerotic calcification at the carotid bifurcation without hemodynamically significant stenosis. LEFT CAROTID SYSTEM: No dissection, occlusion or aneurysm. Mild atherosclerotic calcification at the carotid bifurcation without hemodynamically significant  stenosis. VERTEBRAL ARTERIES: Left dominant configuration. Both origins are clearly patent. There is no dissection, occlusion or flow-limiting stenosis to the skull base (V1-V3 segments). CTA HEAD FINDINGS POSTERIOR CIRCULATION: --Vertebral arteries: Normal V4 segments. --Inferior cerebellar arteries: Normal. --Basilar artery: Normal. --Superior cerebellar arteries: Normal. --Posterior cerebral arteries (PCA): Normal. ANTERIOR CIRCULATION: --Intracranial internal carotid arteries: Atherosclerotic calcification of the internal carotid arteries at the skull base without hemodynamically significant stenosis. --Anterior cerebral arteries (ACA): Normal. Both A1 segments are present. Patent anterior communicating artery (a-comm). --Middle cerebral arteries (MCA): Normal. VENOUS SINUSES: As permitted by contrast timing, patent. ANATOMIC VARIANTS: None Review of the MIP images confirms the above findings. IMPRESSION:  1. No emergent large vessel occlusion or hemodynamically significant stenosis of the head or neck. 2. Mild bilateral carotid bifurcation atherosclerosis without hemodynamically significant stenosis. 3. Unchanged size of right temporal intraparenchymal hemorrhage. No causative vascular lesion identified Aortic atherosclerosis (ICD10-I70.0). Electronically Signed   By: Ulyses Jarred M.D.   On: 12/30/2022 21:12   CT HEAD CODE STROKE WO CONTRAST  Result Date: 12/30/2022 CLINICAL DATA:  Code stroke.  Left-sided weakness and facial droop EXAM: CT HEAD WITHOUT CONTRAST TECHNIQUE: Contiguous axial images were obtained from the base of the skull through the vertex without intravenous contrast. RADIATION DOSE REDUCTION: This exam was performed according to the departmental dose-optimization program which includes automated exposure control, adjustment of the mA and/or kV according to patient size and/or use of iterative reconstruction technique. COMPARISON:  08/17/2020 FINDINGS: Brain: Large intraparenchymal hematoma centered in the right temporal lobe, measuring 6.2 x 3.2 x 3.5 cm (volume 35 mL). There is intraventricular extension into the right lateral ventricle. Small amount of subdural hematoma and subarachnoid blood overlying the primary hemorrhagic site. There is leftward midline shift of 6 mm. Old infarct in the left PCA territory. Vascular: No abnormal hyperdensity of the major intracranial arteries or dural venous sinuses. No intracranial atherosclerosis. Skull: The visualized skull base, calvarium and extracranial soft tissues are normal. Sinuses/Orbits: No fluid levels or advanced mucosal thickening of the visualized paranasal sinuses. No mastoid or middle ear effusion. The orbits are normal. IMPRESSION: 1. Large intraparenchymal hematoma centered in the right temporal lobe, measuring 6.2 x 3.2 x 3.5 cm (volume 35 mL). 2. Intraventricular extension into the right lateral ventricle. 3. Leftward midline shift of  6 mm. Critical Value/emergent results were called by telephone at the time of interpretation on 12/30/2022 at 8:42 pm to provider [facility], who verbally acknowledged these results. Electronically Signed   By: Ulyses Jarred M.D.   On: 12/30/2022 20:43    Procedures Procedures    Medications Ordered in ED Medications  multivitamin with minerals tablet 1 tablet (has no administration in time range)  simvastatin (ZOCOR) tablet 40 mg (has no administration in time range)  acetaminophen (TYLENOL) tablet 650 mg ( Oral See Alternative 12/31/22 0501)    Or  acetaminophen (TYLENOL) 160 MG/5ML solution 650 mg ( Per Tube See Alternative 12/31/22 0501)    Or  acetaminophen (TYLENOL) suppository 650 mg (650 mg Rectal Given 12/31/22 0501)  senna-docusate (Senokot-S) tablet 1 tablet (1 tablet Oral Not Given 12/30/22 2300)  clevidipine (CLEVIPREX) infusion 0.5 mg/mL (has no administration in time range)  sodium chloride (hypertonic) 3 % solution ( Intravenous New Bag/Given 12/31/22 0935)  ondansetron (ZOFRAN) injection 4 mg (4 mg Intravenous Given 12/31/22 0036)  pantoprazole (PROTONIX) EC tablet 40 mg (40 mg Oral Not Given 12/30/22 2300)  Chlorhexidine Gluconate Cloth 2 % PADS 6 each (has no administration in  time range)  Oral care mouth rinse (has no administration in time range)  Oral care mouth rinse (has no administration in time range)  insulin aspart (novoLOG) injection 0-15 Units (has no administration in time range)  sodium chloride flush (NS) 0.9 % injection 3 mL (3 mLs Intravenous Given 12/30/22 2111)  ondansetron (ZOFRAN) injection 4 mg (4 mg Intravenous Given 12/30/22 2026)  metoCLOPramide (REGLAN) injection 10 mg (10 mg Intravenous Given 12/30/22 2122)  HYDROmorphone (DILAUDID) injection 0.5 mg (0.5 mg Intravenous Given 12/31/22 0031)  iohexol (OMNIPAQUE) 350 MG/ML injection 75 mL (75 mLs Intravenous Contrast Given 12/30/22 2054)   stroke: early stages of recovery book ( Does not apply Given  12/31/22 0934)  sodium chloride 3% (hypertonic) IV bolus 250 mL ( Intravenous Infusion Verify 12/31/22 0100)  iohexol (OMNIPAQUE) 350 MG/ML injection 75 mL (75 mLs Intravenous Contrast Given 12/30/22 2200)  gadobutrol (GADAVIST) 1 MMOL/ML injection 9.5 mL (9.5 mLs Intravenous Contrast Given 12/31/22 [id number])    ED Course/ Medical Decision Making/ A&P Clinical Course as of 12/31/22 1023  Mon Dec 30, 2022  2120 Neurologist reporting plan to admit patient  [MT]    Clinical Course User Index [MT] Langston Masker Carola Rhine, MD                             Medical Decision Making Amount and/or Complexity of Data Reviewed Labs: ordered. Radiology: ordered.  Risk Prescription drug management. Decision regarding hospitalization.   This patient presents to the ED with concern for headache, left-sided weakness. This involves an extensive number of treatment options, and is a complaint that carries with it a high risk of complications and morbidity.  The differential diagnosis includes CVA versus ICH versus other  Additional history obtained from EMS   I ordered and personally interpreted labs.  The pertinent results include: No emergent findings.  Minor leukocytosis likely reactive to brain bleed.  Glucose within normal limits  I ordered imaging studies including CT head I independently visualized and interpreted imaging which showed large hemorrhage visualized I agree with the radiologist interpretation  The patient was maintained on a cardiac monitor.  I personally viewed and interpreted the cardiac monitored which showed an underlying rhythm of: Sinus rhythm  Per my interpretation the patient's ECG shows no acute ischemic findings  I ordered medication including IV Reglan and Dilaudid for headache, Zofran was also given.  Patient continues to have nausea   I requested consultation with the neurology,  and discussed lab and imaging findings as well as pertinent plan - they recommend: Medical neuro  ICU admission.  Per review of neurologist note, neurologist planning for medical management at this time but will consider neurosurgery consultation if there is deterioration in patient's status overnight.  Patient's BP [id number] mmhg systolic on my assessment; further BP control by neurology services  At this time his GCS is above 8 and there is no indication for emergent intubation.  He appears to be protecting his airway.  If continues to decompensate he may require intubation at a later time.  Goals of care discussion being held between neurologist and the patient's family members who are en route to the hospital.  After the interventions noted above, I reevaluated the patient and found that they have: stayed the same  Dispostion:  After consideration of the diagnostic results and the patients response to treatment, I feel that the patent would benefit from medical admission  Final Clinical Impression(s) / ED Diagnoses Final diagnoses:  Intracranial hemorrhage [facility])    Rx / DC Orders ED Discharge Orders     None         Wyvonnia Dusky, MD 12/31/22 1025

## 2022-12-30 NOTE — Code Documentation (Signed)
Stroke Response Nurse Documentation Code Documentation  [name] Marks is a 88 y.o. male arriving to [facility]  via [facility] on 3/25 with past medical hx of HTN, PVD, L occipital ICH. On aspirin 81 mg daily. Code stroke was activated by EMS.   Patient from home where he was LKW at 1215 and now complaining of left sided weakness and headache.   Stroke team at the bedside on patient arrival. Labs drawn and patient cleared for CT by Dr. Alvino Chapel. Patient to CT with team. NIHSS 15, see documentation for details and code stroke times. Patient with disoriented, right gaze preference , left hemianopia, left facial droop, left arm weakness, left leg weakness, left decreased sensation, dysarthria , and Sensory  neglect on exam. The following imaging was completed:  CT Head and CTA. Patient is not a candidate for IV Thrombolytic due to [place]. Patient is not a candidate for IR due to [place], No LVO.   Care Plan: SBP 130-150.   Bedside handoff with ED RN Anderson Malta.    Madelynn Done  Rapid Response RN

## 2022-12-31 ENCOUNTER — Inpatient Hospital Stay (HOSPITAL_COMMUNITY): Payer: Medicare PPO

## 2022-12-31 ENCOUNTER — Other Ambulatory Visit: Payer: Self-pay

## 2022-12-31 DIAGNOSIS — I6389 Other cerebral infarction: Secondary | ICD-10-CM

## 2022-12-31 DIAGNOSIS — I611 Nontraumatic intracerebral hemorrhage in hemisphere, cortical: Secondary | ICD-10-CM | POA: Diagnosis not present

## 2022-12-31 LAB — COMPREHENSIVE METABOLIC PANEL
ALT: 17 U/L (ref 0–44)
AST: 26 U/L (ref 15–41)
Albumin: 3.7 g/dL (ref 3.5–5.0)
Alkaline Phosphatase: 58 U/L (ref 38–126)
Anion gap: 13 (ref 5–15)
BUN: 18 mg/dL (ref 8–23)
CO2: 20 mmol/L — ABNORMAL LOW (ref 22–32)
Calcium: 8.6 mg/dL — ABNORMAL LOW (ref 8.9–10.3)
Chloride: 108 mmol/L (ref 98–111)
Creatinine, Ser: 1.24 mg/dL (ref 0.61–1.24)
GFR, Estimated: 57 mL/min — ABNORMAL LOW (ref >60)
Glucose, Bld: 184 mg/dL — ABNORMAL HIGH (ref 70–99)
Potassium: 4.4 mmol/L (ref 3.5–5.1)
Sodium: 141 mmol/L (ref 135–145)
Total Bilirubin: 0.9 mg/dL (ref 0.3–1.2)
Total Protein: 6.6 g/dL (ref 6.5–8.1)

## 2022-12-31 LAB — MRSA NEXT GEN BY PCR, NASAL: MRSA by PCR Next Gen: NOT DETECTED

## 2022-12-31 LAB — ECHOCARDIOGRAM COMPLETE
AR max vel: 2.22 cm2
AV Area VTI: 2.05 cm2
AV Area mean vel: 2.17 cm2
AV Mean grad: 10 mmHg
AV Peak grad: 18.3 mmHg
Ao pk vel: 2.14 m/s
Est EF: >75
Height: 70 in
S' Lateral: 3 cm
Weight: 3449.76 oz

## 2022-12-31 LAB — CBC
HCT: 37.5 % — ABNORMAL LOW (ref 39.0–52.0)
Hemoglobin: 12.3 g/dL — ABNORMAL LOW (ref 13.0–17.0)
MCH: 32.6 pg (ref 26.0–34.0)
MCHC: 32.8 g/dL (ref 30.0–36.0)
MCV: 99.5 fL (ref 80.0–100.0)
Platelets: 206 10*3/uL (ref 150–400)
RBC: 3.77 MIL/uL — ABNORMAL LOW (ref 4.22–5.81)
RDW: 14 % (ref 11.5–15.5)
WBC: 13 10*3/uL — ABNORMAL HIGH (ref 4.0–10.5)
nRBC: 0 % (ref 0.0–0.2)

## 2022-12-31 LAB — RAPID URINE DRUG SCREEN, HOSP PERFORMED
Amphetamines: NOT DETECTED
Barbiturates: NOT DETECTED
Benzodiazepines: NOT DETECTED
Cocaine: NOT DETECTED
Opiates: NOT DETECTED
Tetrahydrocannabinol: NOT DETECTED

## 2022-12-31 LAB — LIPID PANEL
Cholesterol: 128 mg/dL (ref 0–200)
HDL: 59 mg/dL (ref >40)
LDL Cholesterol: 61 mg/dL (ref 0–99)
Total CHOL/HDL Ratio: 2.2 RATIO
Triglycerides: 42 mg/dL (ref <150)
VLDL: 8 mg/dL (ref 0–40)

## 2022-12-31 LAB — SODIUM
Sodium: 146 mmol/L — ABNORMAL HIGH (ref 135–145)
Sodium: 149 mmol/L — ABNORMAL HIGH (ref 135–145)
Sodium: 153 mmol/L — ABNORMAL HIGH (ref 135–145)

## 2022-12-31 LAB — GLUCOSE, CAPILLARY
Glucose-Capillary: 120 mg/dL — ABNORMAL HIGH (ref 70–99)
Glucose-Capillary: 142 mg/dL — ABNORMAL HIGH (ref 70–99)
Glucose-Capillary: 148 mg/dL — ABNORMAL HIGH (ref 70–99)

## 2022-12-31 MED ORDER — METOPROLOL TARTRATE 25 MG PO TABS
25.0000 mg | ORAL_TABLET | Freq: Two times a day (BID) | ORAL | Status: DC
  Administered 2022-12-31: 25 mg via ORAL
  Filled 2022-12-31 (×3): qty 1

## 2022-12-31 MED ORDER — QUETIAPINE FUMARATE 25 MG PO TABS
12.5000 mg | ORAL_TABLET | Freq: Every day | ORAL | Status: DC
  Administered 2022-12-31: 12.5 mg via ORAL
  Filled 2022-12-31: qty 1

## 2022-12-31 MED ORDER — INSULIN ASPART 100 UNIT/ML IJ SOLN
0.0000 [IU] | INTRAMUSCULAR | Status: DC
  Administered 2022-12-31: 2 [IU] via SUBCUTANEOUS
  Administered 2023-01-01: 3 [IU] via SUBCUTANEOUS
  Administered 2023-01-01 (×3): 2 [IU] via SUBCUTANEOUS
  Administered 2023-01-01: 3 [IU] via SUBCUTANEOUS
  Administered 2023-01-01: 2 [IU] via SUBCUTANEOUS
  Administered 2023-01-02: 3 [IU] via SUBCUTANEOUS
  Administered 2023-01-02 (×3): 2 [IU] via SUBCUTANEOUS
  Administered 2023-01-02: 3 [IU] via SUBCUTANEOUS
  Administered 2023-01-02 – 2023-01-03 (×4): 2 [IU] via SUBCUTANEOUS
  Administered 2023-01-03: 3 [IU] via SUBCUTANEOUS
  Administered 2023-01-03: 2 [IU] via SUBCUTANEOUS
  Administered 2023-01-04: 3 [IU] via SUBCUTANEOUS
  Administered 2023-01-04 (×3): 2 [IU] via SUBCUTANEOUS

## 2022-12-31 MED ORDER — ISOSORBIDE MONONITRATE ER 30 MG PO TB24
60.0000 mg | ORAL_TABLET | Freq: Every day | ORAL | Status: DC
  Filled 2022-12-31 (×2): qty 2

## 2022-12-31 MED ORDER — GADOBUTROL 1 MMOL/ML IV SOLN
9.5000 mL | Freq: Once | INTRAVENOUS | Status: AC | PRN
  Administered 2022-12-31: 9.5 mL via INTRAVENOUS

## 2022-12-31 MED ORDER — SIMVASTATIN 20 MG PO TABS: 40.0000 mg | ORAL_TABLET | Freq: Every day | ORAL | Status: DC

## 2022-12-31 MED ORDER — LIDOCAINE HCL URETHRAL/MUCOSAL 2 % EX GEL
1.0000 | Freq: Once | CUTANEOUS | Status: AC
  Administered 2022-12-31: 1 via URETHRAL
  Filled 2022-12-31: qty 11

## 2022-12-31 MED ORDER — LOSARTAN POTASSIUM 50 MG PO TABS
100.0000 mg | ORAL_TABLET | Freq: Every day | ORAL | Status: DC
  Filled 2022-12-31 (×2): qty 2

## 2022-12-31 MED ORDER — SODIUM CHLORIDE 0.9% FLUSH: 10.0000 mL | INTRAVENOUS | Status: DC | PRN

## 2022-12-31 MED ORDER — SODIUM CHLORIDE 0.9% FLUSH
10.0000 mL | Freq: Two times a day (BID) | INTRAVENOUS | Status: DC
  Administered 2022-12-31 – 2023-01-01 (×3): 10 mL
  Administered 2023-01-02: 20 mL
  Administered 2023-01-02 – 2023-01-03 (×3): 10 mL
  Administered 2023-01-04: 30 mL
  Administered 2023-01-04: 10 mL

## 2022-12-31 NOTE — Evaluation (Signed)
Speech Language Pathology Evaluation Patient Details Name: [name] Marks MRN: [id number] DOB: 04-22-1935 Today's Date: 12/31/2022 Time: [id number] SLP Time Calculation (min) (ACUTE ONLY): 18 min  Problem List:  Patient Active Problem List   Diagnosis Date Noted   OSA (obstructive sleep apnea) 05/31/2021   ICH (intracerebral hemorrhage) ([place]) 05/21/2019   Impingement syndrome of left shoulder 10/17/2016   Acute pain of left shoulder 09/19/2016   Preop testing 12/06/2011   CHEST PAIN UNSPECIFIED 03/13/2010   HEARTBURN 08/18/2009   Hypertension 04/19/2009   ANGINA, CHRONIC 04/19/2009   Dyslipidemia 11/19/2007   CORONARY ARTERY DISEASE, S/P PTCA 11/19/2007   NEOPLASM, MALIGNANT, PROSTATE, HX OF 11/19/2007   NEPHROLITHIASIS, HX OF 11/19/2007   Past Medical History:  Past Medical History:  Diagnosis Date   Cerebrovascular disease    DJD (degenerative joint disease)    History of echocardiogram    Echo 5/22: EF 60-65, no RWMA, mild MR, trivial AI, mild-moderate AV sclerosis without stenosis   Hyperlipidemia    Hypertension    Peripheral vascular disease ([place])    Polyposis of colon    Subdural hematoma ([place])    history of    Past Surgical History:  Past Surgical History:  Procedure Laterality Date   CARDIAC CATHETERIZATION  03/21/2010   Patent LAD (coronary artery) stents - No significant obstructive disease in the left circumflex -- Patent RCA with moderately severe ostial stenosis of the posterior descending (coronary) artery branch.   CARDIAC CATHETERIZATION  08/01/2009    IR ANGIO INTRA EXTRACRAN SEL COM CAROTID INNOMINATE BILAT MOD SED  09/14/2019   IR ANGIO VERTEBRAL SEL SUBCLAVIAN INNOMINATE UNI L MOD SED  09/14/2019   IR ANGIO VERTEBRAL SEL VERTEBRAL UNI R MOD SED  09/14/2019   IR US GUIDE VASC ACCESS RIGHT  09/14/2019   LEFT HEART CATHETERIZATION WITH CORONARY ANGIOGRAM N/A 11/08/2013   Procedure: LEFT HEART CATHETERIZATION WITH CORONARY ANGIOGRAM;  Surgeon: Blane Ohara, MD;  Location: [facility] CATH LAB;  Service: Cardiovascular;  Laterality: N/A;   HPI:  Pt is an 88 yo male presenting via EMS with L sided weakness and headahce. Found to have R eye gaze preference, L hemianopia, L facial droop, L arm weakness, and dysarthria. MRI Brain 3/25 revealed unchanged large intraparenchymal hematoma centered in R temporal lobe and slightly increased blood within both lateral ventricles. PMH includes HTN, PVD, L occipital ICH   Assessment / Plan / Recommendation Clinical Impression  Pt was drowsy throughout today's session. He initially did not open his eyes to any stimuli and intermittently followed commands noted through an oral motor exam and functional tasks (brushing teeth and washing face). Pt's cognition was evaluated informally due to lethargy and appears to be impaired, evidenced by difficulties with orientation, attention, and awareness. As POs were presented during a bedside swallowing evaluation, his attention improved and he sustained alertness throughout PO trials with Max cueing. Pt attempted to speak minimally throughout the session with a flat affect and mild dysarthria. Question impact of hearing loss and lethargy on cognition. Recommend SLP f/u to address noted cognitive deficits and speech intelligibility through functional treatment activities as mentation improves.    SLP Assessment  SLP Recommendation/Assessment: Patient needs continued Speech Lanaguage Pathology Services SLP Visit Diagnosis: Cognitive communication deficit (R41.841);Dysarthria and anarthria (R47.1)    Recommendations for follow up therapy are one component of a multi-disciplinary discharge planning process, led by the attending physician.  Recommendations may be updated based on patient status, additional functional criteria and insurance authorization.  Follow Up Recommendations  Acute inpatient rehab (3hours/day)    Assistance Recommended at Discharge  Frequent or constant  Supervision/Assistance  Functional Status Assessment Patient has had a recent decline in their functional status and demonstrates the ability to make significant improvements in function in a reasonable and predictable amount of time.  Frequency and Duration min 2x/week  2 weeks      SLP Evaluation Cognition  Overall Cognitive Status: Impaired/Different from baseline Arousal/Alertness: Lethargic Orientation Level: Other (comment) (pt did not respond to any initial orientation questions) Attention: Sustained Sustained Attention: Impaired Sustained Attention Impairment: Verbal basic Memory: Impaired Memory Impairment: Decreased short term memory Decreased Short Term Memory: Verbal basic Awareness: Impaired Awareness Impairment: Intellectual impairment Problem Solving: Impaired Problem Solving Impairment: Verbal basic;Functional basic Safety/Judgment: Impaired       Comprehension  Auditory Comprehension Overall Auditory Comprehension: Impaired Commands: Impaired One Step Basic Commands: 25-49% accurate Conversation: Simple Interfering Components: Hearing;Attention;Other (comment) (level of alertness) Visual Recognition/Discrimination Discrimination: Not tested Reading Comprehension Reading Status: Not tested    Expression Expression Primary Mode of Expression: Verbal Verbal Expression Overall Verbal Expression: Impaired Level of Generative/Spontaneous Verbalization: Phrase Pragmatics: Impairment Impairments: Abnormal affect;Eye contact Written Expression Written Expression: Not tested   Oral / Motor  Oral Motor/Sensory Function Overall Oral Motor/Sensory Function: Mild impairment (pt not consistently following commands) Facial Symmetry: Abnormal symmetry left Motor Speech Overall Motor Speech: Impaired Articulation: Impaired Level of Impairment: Word Intelligibility: Intelligibility reduced Word: 75-100% accurate            Fabio Asa., Student SLP  12/31/2022,  11:25 AM

## 2022-12-31 NOTE — Progress Notes (Signed)
PT Cancellation Note  Patient Details Name: [name] Marks MRN: [id number] DOB: May 23, 1935   Cancelled Treatment:    Reason Eval/Treat Not Completed: Active bedrest order  Will monitor for appropriateness to proceed with evaluation.    [place]  Office [phone]  Rexanne Mano 12/31/2022, 8:22 AM

## 2022-12-31 NOTE — Progress Notes (Signed)
Echocardiogram 2D Echocardiogram has been performed.  [name] Marks 12/31/2022, 11:22 AM

## 2022-12-31 NOTE — Progress Notes (Addendum)
STROKE TEAM PROGRESS NOTE   INTERVAL HISTORY His family is at the bedside.  Patient was a code stroke yesterday, activated for left facial droop left-sided weakness and right gaze.  Stat CT demonstrated large right temporal lobe hematoma with right IVH, 6 mm midline shift, small overlying SDH.   Patient received a bolus of 3% and is now on 3% at 75 an hour, with goal sodium of 150-155. On assessment patient opens eyes to voice intermittently, intermittently follows commands.  Exhibiting eye-opening apraxia.   Strict blood pressure control of 130-150 goal x 24 hours, goal less than 160 after 24hrs with stable imaging. AM CT ordered.   Vitals:   12/31/22 1200 12/31/22 1300 12/31/22 1400 12/31/22 1500  BP: 137/78 (!) 143/66 131/83 (!) 140/64  Pulse: (!) 116 (!) 103 98 84  Resp: (!) 25 (!) 23 18 16   Temp:      TempSrc:      SpO2: 97% 97% 97% 99%  Weight:      Height:       CBC:  Recent Labs  Lab 12/30/22 2027 12/30/22 2032 12/31/22 0405  WBC 12.8*  --  13.0*  NEUTROABS 10.3*  --   --   HGB 13.0 13.3 12.3*  HCT 38.6* 39.0 37.5*  MCV 97.7  --  99.5  PLT 211  --  [id number]   Basic Metabolic Panel:  Recent Labs  Lab 12/30/22 2027 12/30/22 2032 12/30/22 2219 12/31/22 0405 12/31/22 1022  NA 137 140   < > 141 146*  K 4.1 4.2  --  4.4  --   CL 103 108  --  108  --   CO2 20*  --   --  20*  --   GLUCOSE 164* 156*  --  184*  --   BUN 20 21  --  18  --   CREATININE 1.19 1.10  --  1.24  --   CALCIUM 9.3  --   --  8.6*  --    < > = values in this interval not displayed.   Lipid Panel: No results for input(s): "CHOL", "TRIG", "HDL", "CHOLHDL", "VLDL", "LDLCALC" in the last 168 hours. HgbA1c: No results for input(s): "HGBA1C" in the last 168 hours. Urine Drug Screen:  Recent Labs  Lab 12/31/22 0204  LABOPIA NONE DETECTED  COCAINSCRNUR NONE DETECTED  LABBENZ NONE DETECTED  AMPHETMU NONE DETECTED  THCU NONE DETECTED  LABBARB NONE DETECTED    Alcohol Level  Recent Labs  Lab  12/30/22 2219  ETH <10    IMAGING past 24 hours ECHOCARDIOGRAM COMPLETE  Result Date: 12/31/2022    ECHOCARDIOGRAM REPORT   Patient Name:   [name] Marks Date of Exam: 12/31/2022 Medical Rec #:  [id number]        Height:       70.0 in Accession #:    [id number]       Weight:       215.6 lb Date of Birth:  [birth year]         BSA:          2.155 m Patient Age:    88 years         BP:           102/65 mmHg Patient Gender: M                HR:           104 bpm. Exam Location:  Inpatient Procedure: 2D Echo, Cardiac Doppler  and Color Doppler Indications:    Stroke I63.9  History:        Patient has prior history of Echocardiogram examinations, most                 recent 03/01/2021. Angina and CAD, Signs/Symptoms:Chest Pain;                 Risk Factors:Hypertension, Sleep Apnea and Dyslipidemia.  Sonographer:    Ronny Flurry Referring Phys: [id number] [place]  1. Left ventricular ejection fraction, by estimation, is >75%. The left ventricle has hyperdynamic function. The left ventricle has no regional wall motion abnormalities. Indeterminate diastolic filling due to E-A fusion.  2. Right ventricular systolic function is hyperdynamic. The right ventricular size is normal. There is mildly elevated pulmonary artery systolic pressure.  3. The mitral valve is normal in structure. No evidence of mitral valve regurgitation. No evidence of mitral stenosis.  4. The aortic valve is tricuspid. There is mild thickening of the aortic valve. Aortic valve regurgitation is not visualized. Aortic valve sclerosis is present, with no evidence of aortic valve stenosis.  5. The inferior vena cava is normal in size with greater than 50% respiratory variability, suggesting right atrial pressure of 3 mmHg. FINDINGS  Left Ventricle: Left ventricular ejection fraction, by estimation, is >75%. The left ventricle has hyperdynamic function. The left ventricle has no regional wall motion abnormalities. The left  ventricular internal cavity size was normal in size. There is no left ventricular hypertrophy. Indeterminate diastolic filling due to E-A fusion. Right Ventricle: The right ventricular size is normal. No increase in right ventricular wall thickness. Right ventricular systolic function is hyperdynamic. There is mildly elevated pulmonary artery systolic pressure. The tricuspid regurgitant velocity is 3.08 m/s, and with an assumed right atrial pressure of 3 mmHg, the estimated right ventricular systolic pressure is [id number] mmHg. Left Atrium: Left atrial size was normal in size. Right Atrium: Right atrial size was normal in size. Pericardium: There is no evidence of pericardial effusion. Mitral Valve: The mitral valve is normal in structure. No evidence of mitral valve regurgitation. No evidence of mitral valve stenosis. Tricuspid Valve: The tricuspid valve is normal in structure. Tricuspid valve regurgitation is trivial. No evidence of tricuspid stenosis. Aortic Valve: The aortic valve is tricuspid. There is mild thickening of the aortic valve. Aortic valve regurgitation is not visualized. Aortic valve sclerosis is present, with no evidence of aortic valve stenosis. Aortic valve mean gradient measures 10.0 mmHg. Aortic valve peak gradient measures 18.3 mmHg. Aortic valve area, by VTI measures 2.05 cm. Pulmonic Valve: The pulmonic valve was normal in structure. Pulmonic valve regurgitation is not visualized. No evidence of pulmonic stenosis. Aorta: The aortic root is normal in size and structure. Venous: The inferior vena cava is normal in size with greater than 50% respiratory variability, suggesting right atrial pressure of 3 mmHg. IAS/Shunts: No atrial level shunt detected by color flow Doppler.  LEFT VENTRICLE PLAX 2D LVIDd:         4.60 cm   Diastology LVIDs:         3.00 cm   LV e' medial:   17.60 cm/s LV PW:         1.10 cm   LV E/e' medial: 9.0 LV IVS:        1.00 cm LVOT diam:     2.00 cm LV SV:         72 LV SV  Index:   33 LVOT Area:  3.14 cm  RIGHT VENTRICLE             IVC RV S prime:     22.20 cm/s  IVC diam: 1.80 cm TAPSE (M-mode): 1.8 cm LEFT ATRIUM             Index        RIGHT ATRIUM           Index LA diam:        3.90 cm 1.81 cm/m   RA Area:     15.90 cm LA Vol (A2C):   28.0 ml 12.99 ml/m  RA Volume:   30.30 ml  14.06 ml/m LA Vol (A4C):   55.6 ml 25.80 ml/m LA Biplane Vol: 41.8 ml 19.40 ml/m  AORTIC VALVE AV Area (Vmax):    2.22 cm AV Area (Vmean):   2.17 cm AV Area (VTI):     2.05 cm AV Vmax:           214.00 cm/s AV Vmean:          147.000 cm/s AV VTI:            0.349 m AV Peak Grad:      18.3 mmHg AV Mean Grad:      10.0 mmHg LVOT Vmax:         151.33 cm/s LVOT Vmean:        101.667 cm/s LVOT VTI:          0.228 m LVOT/AV VTI ratio: 0.65  AORTA Ao Root diam: 3.30 cm Ao Asc diam:  3.60 cm MV E velocity: 158.00 cm/s  TRICUSPID VALVE                             TR Peak grad:   37.9 mmHg                             TR Vmax:        308.00 cm/s                              SHUNTS                             Systemic VTI:  0.23 m                             Systemic Diam: 2.00 cm Dani Gobble Croitoru MD Electronically signed by Sanda Klein MD Signature Date/Time: 12/31/2022/1:28:52 PM    Final    [place] EKG SITE RITE  Result Date: 12/31/2022 If Site Rite image not attached, placement could not be confirmed due to current cardiac rhythm.  CT HEAD WO CONTRAST  Result Date: 12/31/2022 CLINICAL DATA:  Hemorrhagic stroke follow-up EXAM: CT HEAD WITHOUT CONTRAST TECHNIQUE: Contiguous axial images were obtained from the base of the skull through the vertex without intravenous contrast. RADIATION DOSE REDUCTION: This exam was performed according to the departmental dose-optimization program which includes automated exposure control, adjustment of the mA and/or kV according to patient size and/or use of iterative reconstruction technique. COMPARISON:  12/30/2022 FINDINGS: Brain: Unchanged appearance of large  intraparenchymal hematoma centered in the right temporal lobe. Small volume overlying subarachnoid and subdural blood. There is now a small amount of subarachnoid blood visible over  the posterior left hemisphere. Blood within both lateral ventricles, slightly increased. Unchanged 5 mm leftward midline shift. Vascular: Atherosclerotic calcification of the internal carotid arteries at the skull base. Skull: Normal. Negative for fracture or focal lesion. Sinuses/Orbits: No acute finding. Other: None. IMPRESSION: 1. Unchanged appearance of large intraparenchymal hematoma centered in the right temporal lobe with small volume overlying subarachnoid and subdural blood. 2. There is now a small amount of subarachnoid blood visible over the posterior left hemisphere. 3. Blood within both lateral ventricles, slightly increased. 4. Unchanged 5 mm leftward midline shift. Electronically Signed   By: Ulyses Jarred M.D.   On: 12/31/2022 03:54   MR BRAIN W WO CONTRAST  Result Date: 12/31/2022 CLINICAL DATA:  Transient ischemic attack EXAM: MRI HEAD WITHOUT AND WITH CONTRAST TECHNIQUE: Multiplanar, multiecho pulse sequences of the brain and surrounding structures were obtained without and with intravenous contrast. CONTRAST:  9.74mL GADAVIST GADOBUTROL 1 MMOL/ML IV SOLN COMPARISON:  08/11/2019 FINDINGS: Brain: Large area of hemorrhage in the right temporal lobe with overlying subdural and subarachnoid blood. There is blood in the right lateral ventricle, unchanged. Unchanged 5 mm leftward midline shift. 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 There is stippled contrast enhancement in the right temporal lobe, possibly due to blood brain barrier breakdown following hemorrhage. The midline structures are normal. Vascular: Normal flow voids. Skull and upper cervical spine: Normal marrow signal. Sinuses/Orbits: Negative. Other: None. IMPRESSION: 1. Unchanged large area of hemorrhage in the right temporal lobe with overlying subdural  and subarachnoid blood. 2. Unchanged 5 mm leftward midline shift. 3. Stippled contrast enhancement in the right temporal lobe, possibly due to blood brain barrier breakdown following hemorrhage. 4. 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 Electronically Signed   By: Ulyses Jarred M.D.   On: 12/31/2022 03:48   CT VENOGRAM HEAD  Result Date: 12/30/2022 CLINICAL DATA:  Hemorrhagic stroke EXAM: CT VENOGRAM HEAD TECHNIQUE: Venographic phase images of the brain were obtained following the administration of intravenous contrast. Multiplanar reformats and maximum intensity projections were generated. RADIATION DOSE REDUCTION: This exam was performed according to the departmental dose-optimization program which includes automated exposure control, adjustment of the mA and/or kV according to patient size and/or use of iterative reconstruction technique. CONTRAST:  77mL OMNIPAQUE IOHEXOL 350 MG/ML SOLN COMPARISON:  None Available. FINDINGS: Superior sagittal sinus: Normal. Straight sinus: Normal. Inferior sagittal sinus, vein of Galen and internal cerebral veins: Normal. Transverse sinuses: Normal. Sigmoid sinuses: Normal. Visualized jugular veins: Normal. IMPRESSION: No dural venous sinus thrombosis. Electronically Signed   By: Ulyses Jarred M.D.   On: 12/30/2022 22:30   CT ANGIO HEAD NECK W WO CM (CODE STROKE)  Result Date: 12/30/2022 CLINICAL DATA:  Left-sided weakness and facial EXAM: CT ANGIOGRAPHY HEAD AND NECK TECHNIQUE: Multidetector CT imaging of the head and neck was performed using the standard protocol during bolus administration of intravenous contrast. Multiplanar CT image reconstructions and MIPs were obtained to evaluate the vascular anatomy. Carotid stenosis measurements (when applicable) are obtained utilizing NASCET criteria, using the distal internal carotid diameter as the denominator. RADIATION DOSE REDUCTION: This exam was performed according to the departmental dose-optimization program which  includes automated exposure control, adjustment of the mA and/or kV according to patient size and/or use of iterative reconstruction technique. CONTRAST:  19mL OMNIPAQUE IOHEXOL 350 MG/ML SOLN COMPARISON:  None Available. FINDINGS: CTA NECK FINDINGS SKELETON: There is no bony spinal canal stenosis. No lytic or blastic lesion. OTHER NECK: Normal pharynx, larynx and major salivary glands. No cervical lymphadenopathy.  Unremarkable thyroid gland. UPPER CHEST: No pneumothorax or pleural effusion. No nodules or masses. AORTIC ARCH: There is calcific atherosclerosis of the aortic arch. There is no aneurysm, dissection or hemodynamically significant stenosis of the visualized portion of the aorta. Conventional 3 vessel aortic branching pattern. The visualized proximal subclavian arteries are widely patent. RIGHT CAROTID SYSTEM: No dissection, occlusion or aneurysm. Mild atherosclerotic calcification at the carotid bifurcation without hemodynamically significant stenosis. LEFT CAROTID SYSTEM: No dissection, occlusion or aneurysm. Mild atherosclerotic calcification at the carotid bifurcation without hemodynamically significant stenosis. VERTEBRAL ARTERIES: Left dominant configuration. Both origins are clearly patent. There is no dissection, occlusion or flow-limiting stenosis to the skull base (V1-V3 segments). CTA HEAD FINDINGS POSTERIOR CIRCULATION: --Vertebral arteries: Normal V4 segments. --Inferior cerebellar arteries: Normal. --Basilar artery: Normal. --Superior cerebellar arteries: Normal. --Posterior cerebral arteries (PCA): Normal. ANTERIOR CIRCULATION: --Intracranial internal carotid arteries: Atherosclerotic calcification of the internal carotid arteries at the skull base without hemodynamically significant stenosis. --Anterior cerebral arteries (ACA): Normal. Both A1 segments are present. Patent anterior communicating artery (a-comm). --Middle cerebral arteries (MCA): Normal. VENOUS SINUSES: As permitted by  contrast timing, patent. ANATOMIC VARIANTS: None Review of the MIP images confirms the above findings. IMPRESSION: 1. No emergent large vessel occlusion or hemodynamically significant stenosis of the head or neck. 2. Mild bilateral carotid bifurcation atherosclerosis without hemodynamically significant stenosis. 3. Unchanged size of right temporal intraparenchymal hemorrhage. No causative vascular lesion identified Aortic atherosclerosis (ICD10-I70.0). Electronically Signed   By: Ulyses Jarred M.D.   On: 12/30/2022 21:12   CT HEAD CODE STROKE WO CONTRAST  Result Date: 12/30/2022 CLINICAL DATA:  Code stroke.  Left-sided weakness and facial droop EXAM: CT HEAD WITHOUT CONTRAST TECHNIQUE: Contiguous axial images were obtained from the base of the skull through the vertex without intravenous contrast. RADIATION DOSE REDUCTION: This exam was performed according to the departmental dose-optimization program which includes automated exposure control, adjustment of the mA and/or kV according to patient size and/or use of iterative reconstruction technique. COMPARISON:  08/17/2020 FINDINGS: Brain: Large intraparenchymal hematoma centered in the right temporal lobe, measuring 6.2 x 3.2 x 3.5 cm (volume 35 mL). There is intraventricular extension into the right lateral ventricle. Small amount of subdural hematoma and subarachnoid blood overlying the primary hemorrhagic site. There is leftward midline shift of 6 mm. Old infarct in the left PCA territory. Vascular: No abnormal hyperdensity of the major intracranial arteries or dural venous sinuses. No intracranial atherosclerosis. Skull: The visualized skull base, calvarium and extracranial soft tissues are normal. Sinuses/Orbits: No fluid levels or advanced mucosal thickening of the visualized paranasal sinuses. No mastoid or middle ear effusion. The orbits are normal. IMPRESSION: 1. Large intraparenchymal hematoma centered in the right temporal lobe, measuring 6.2 x 3.2 x  3.5 cm (volume 35 mL). 2. Intraventricular extension into the right lateral ventricle. 3. Leftward midline shift of 6 mm. Critical Value/emergent results were called by telephone at the time of interpretation on 12/30/2022 at 8:42 pm to provider [facility], who verbally acknowledged these results. Electronically Signed   By: Ulyses Jarred M.D.   On: 12/30/2022 20:43    PHYSICAL EXAM  Temp:  [98 F (36.7 C)-99.5 F (37.5 C)] 99.5 F (37.5 C) (03/26 1142) Pulse Rate:  [79-118] 84 (03/26 1500) Resp:  [8-25] 16 (03/26 1500) BP: (102-166)/(49-115) 140/64 (03/26 1500) SpO2:  [93 %-100 %] 99 % (03/26 1500) Weight:  [97.8 kg] 97.8 kg (03/25 2000)  General - Well nourished, well developed elderly Caucasian male, in no apparent distress. Cardiovascular -  Regular rhythm and rate.  Mental Status -  Patient lying comfortably with eyes closed with exhibiting eye-opening apraxia, but does occasionally open eyes to voice.  Patient followed commands of sticking tongue out and showing 2 fingers.  Patient does blink to threat on the right side and shows right gaze preference when eyes are forced open.  Motor Strength -left hemiparesis present, no movement on left side to command. However, during assessment patient did lift left arm up to face.  Does withdrawal to pain in all extremities.   Sensory -withdrew to pain in all extremities, right greater than left.  Coordination - unable to assess  Gait and Station - deferred.   ASSESSMENT/PLAN [name] Marks is a 88 y.o. male with PMH significant for HTN, peripheral vascular disease, prior L occipital lobe ICH, SDH who presents with headache and L sided weakness. He was found to have 31ml, multiloculated large R temporal lobe hematoma with R IVH, 63mm midline shift along with small overlying SDH. No trauma, not on AC. No obvious amyloidosis noted on prior MRI from 2020. Was hypertensive to 190s at one point but BP spontaneously down to [id number] systolic  prior to any intervention.   Patient previously seen in 2020 for acute left occipital lobe hemorrhage, followed up with Dr. Leonie Man in his office.  Cerebral angio was ordered to explore etiology of AVM versus thrombosed vein.  No definitive aneurysm or abnormal blood vessel which could have caused brain hemorrhage was discovered during this procedure.   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 Intraventricular extension into the right lateral ventricle. Leftward midline shift of 6 mm. Repeat CT 3/26: Unchanged appearance of large intraparenchymal hematoma centered in the right temporal lobe with small volume overlying subarachnoid and subdural blood. There is now a small amount of subarachnoid blood visible over the posterior left hemisphere. Blood within both lateral ventricles, slightly increased. Unchanged 5 mm leftward midline shift CTA head & neck: No emergent large vessel occlusion or hemodynamically significant stenosis of the head or neck. CT venogram: No dural venous sinus thrombosis   MRI:  Unchanged large area of hemorrhage in the right temporal lobe with overlying subdural and subarachnoid blood. Unchanged 5 mm leftward midline shift. Stippled contrast enhancement in the right temporal lobe, possibly due to blood brain barrier breakdown following hemorrhage. 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  2D Echo: LVEF greater than 75%, indeterminate diastolic filling due to ED-a fusion mildly elevated pulmonary artery systolic pressure, mild thickening of the aortic valve, aortic valve sclerosis present.  LDL No results found for requested labs within last 1095 days. HgbA1c No results found for requested labs within last 1095 days. VTE  prophylaxis - SCDs    Diet   Diet NPO time specified  Ok for ice chip/meds crushed with applesauce aspirin 81 mg daily prior to admission, now on No antithrombotic due to ICH/IVH. Patient has now had 2 ICHs, would recommend patient never be placed on blood thinners.  Therapy recommendations:  pending Disposition:  pending  Brain Compression with midline shift 3% Na @ 82ml/hr Na checks Q6h, Na goal 150-155 Na 141 -> 146 PICC line ordered Can consider Neurosurgery eval if deterioration of mental status  STAT CT with any change in neuro status  Hypertension Home meds: Losartan 100  mg, metoprolol 25 mg Stable SBO Goal 130-150 x 24 hours, then <160 Long-term BP goal normotensive  Hyperlipidemia Home meds: Simvastatin 40 mg  Given patients CAD, will start statin tomorrow; holding 1 day for ICH  LDL No results found for requested labs within last 1095 days., goal < 70 Continue statin at discharge  Other Stroke Risk Factors Advanced Age >/= 23  Former Cigarette smoker Obesity, Body mass index is 30.94 kg/m., BMI >/= 30 associated with increased stroke risk, recommend weight loss, diet and exercise as appropriate  Hx stroke/TIA  Other Active Problems Oliguria Unable to pass in/out catheter Coude catheter placed Strict I&Os  Hospital day # 1   Pt seen by Neuro NP/APP and later by MD. Note/plan to be edited by MD as needed.    Otelia Santee, DNP, AGACNP-BC [facility] Please use AMION for pager and EPIC for messaging  I have personally obtained history,examined this patient, reviewed notes, independently viewed imaging studies, participated in medical decision making and plan of care.ROS completed by me personally and pertinent positives fully documented  I have made any additions or clarifications directly to the above note. Agree with note above.  Patient has presented with a large parenchymal right temporal hematoma with cytotoxic edema and midline shift.  Exact  etiology is indeterminate but could be amyloid angiopathy versus hypertensive bleed.  Patient had left parietal occipital parenchymal hematoma a few years ago recovered very well from that.  Recommend close neurological observation and strict blood pressure control with systolic goal [id number] for the first 24 hours and then below 160 thereafter.  Continue hypertonic saline.  Cytotoxic edema with serum sodium goal 150-155.  Insert PICC line for IV access.  On discussion with patient's wife and daughter at the bedside and answered questions.  Repeat CT head without tomorrow morning.This patient is critically ill and at significant risk of neurological worsening, death and care requires constant monitoring of vital signs, hemodynamics,respiratory and cardiac monitoring, extensive review of multiple databases, frequent neurological assessment, discussion with family, other specialists and medical decision making of high complexity.I have made any additions or clarifications directly to the above note.This critical care time does not reflect procedure time, or teaching time or supervisory time of PA/NP/Med Resident etc but could involve care discussion time.  I spent 30 minutes of neurocritical care time  in the care of  this patient.      Antony Contras, MD Medical Director [facility] Pager: [phone] 12/31/2022 5:20 PM   To contact Stroke Continuity provider, please refer to [email or web address] After hours, contact General Neurology

## 2022-12-31 NOTE — Progress Notes (Signed)
Peripherally Inserted Central Catheter Placement  The IV Nurse has discussed with the patient and/or persons authorized to consent for the patient, the purpose of this procedure and the potential benefits and risks involved with this procedure.  The benefits include less needle sticks, lab draws from the catheter, and the patient may be discharged home with the catheter. Risks include, but not limited to, infection, bleeding, blood clot (thrombus formation), and puncture of an artery; nerve damage and irregular heartbeat and possibility to perform a PICC exchange if needed/ordered by physician.  Alternatives to this procedure were also discussed.  Bard Power PICC patient education guide, fact sheet on infection prevention and patient information card has been provided to patient /or left at bedside.    PICC Placement Documentation  PICC Triple Lumen 12/31/22 Right Brachial 39 cm 0 cm (Active)  Indication for Insertion or Continuance of Line Administration of hyperosmolar/irritating solutions (i.e. TPN, Vancomycin, etc.) 12/31/22 1550  Exposed Catheter (cm) 0 cm 12/31/22 1550  Site Assessment Clean, Dry, Intact 12/31/22 1550  Lumen #1 Status Flushed;Blood return noted 12/31/22 1550  Lumen #2 Status Flushed;Blood return noted;Saline locked 12/31/22 1550  Lumen #3 Status Flushed;Blood return noted;Saline locked 12/31/22 1550  Dressing Type Transparent 12/31/22 1550  Dressing Status Antimicrobial disc in place 12/31/22 1550  Safety Lock Not Applicable [id number]  Line Care Connections checked and tightened 12/31/22 1550  Line Adjustment (NICU/IV Team Only) No 12/31/22 1550  Dressing Intervention New dressing 12/31/22 1550  Dressing Change Due 01/07/23 12/31/22 1550       Scotty Court 12/31/2022, 4:05 PM

## 2022-12-31 NOTE — Progress Notes (Signed)
OT Cancellation Note  Patient Details Name: [name] Marks MRN: [id number] DOB: 1935/07/28   Cancelled Treatment:    Reason Eval/Treat Not Completed: Active bedrest order (Will continue to follow patient and evaluate when bedrest order has been discontinued.)  Ailene Ravel, OTR/L,CBIS  Supplemental OT - [facility] Secure Chat Preferred   12/31/2022, 8:29 AM

## 2022-12-31 NOTE — Evaluation (Signed)
Clinical/Bedside Swallow Evaluation Patient Details  Name: [name] Marks MRN: [id number] Date of Birth: 1934-12-11  Today's Date: 12/31/2022 Time: SLP Start Time (ACUTE ONLY): 0910 SLP Stop Time (ACUTE ONLY): 0927 SLP Time Calculation (min) (ACUTE ONLY): 17 min  Past Medical History:  Past Medical History:  Diagnosis Date   Cerebrovascular disease    DJD (degenerative joint disease)    History of echocardiogram    Echo 5/22: EF 60-65, no RWMA, mild MR, trivial AI, mild-moderate AV sclerosis without stenosis   Hyperlipidemia    Hypertension    Peripheral vascular disease ([place])    Polyposis of colon    Subdural hematoma ([place])    history of    Past Surgical History:  Past Surgical History:  Procedure Laterality Date   CARDIAC CATHETERIZATION  03/21/2010   Patent LAD (coronary artery) stents - No significant obstructive disease in the left circumflex -- Patent RCA with moderately severe ostial stenosis of the posterior descending (coronary) artery branch.   CARDIAC CATHETERIZATION  08/01/2009    IR ANGIO INTRA EXTRACRAN SEL COM CAROTID INNOMINATE BILAT MOD SED  09/14/2019   IR ANGIO VERTEBRAL SEL SUBCLAVIAN INNOMINATE UNI L MOD SED  09/14/2019   IR ANGIO VERTEBRAL SEL VERTEBRAL UNI R MOD SED  09/14/2019   IR US GUIDE VASC ACCESS RIGHT  09/14/2019   LEFT HEART CATHETERIZATION WITH CORONARY ANGIOGRAM N/A 11/08/2013   Procedure: LEFT HEART CATHETERIZATION WITH CORONARY ANGIOGRAM;  Surgeon: Blane Ohara, MD;  Location: [facility] CATH LAB;  Service: Cardiovascular;  Laterality: N/A;   HPI:  Pt is an 88 yo male presenting via EMS with L sided weakness and headahce. Found to have R eye gaze preference, L hemianopia, L facial droop, L arm weakness, and dysarthria. MRI Brain 3/25 revealed unchanged large intraparenchymal hematoma centered in R temporal lobe and slightly increased blood within both lateral ventricles. PMH includes HTN, PVD, L occipital ICH    Assessment / Plan / Recommendation   Clinical Impression  Pt's wife and daughter provide no history of swallowing difficulties or PNA. They state that he occasionally has reflux. Pt was drowsy throughout today's session and followed commands minimally to complete an oral motor exam. SLP provided oral care with noted dry lingual surface. Pt required cueing to attend to PO presentations, although his attention improved during these trials. He immediately cleared his throat following trials of ice chips and thin liquids via spoon, but exhibited no other overt s/s of dysphagia during trials of purees. SLP will f/u to assess potential to trial upgraded textures as mentation allows. Recommend pt remain NPO with ice chips provided intermittently and meds crushed in purees with full supervision and only when pt is fully alert and positioned upright. SLP Visit Diagnosis: Dysphagia, unspecified (R13.10)    Aspiration Risk  Mild aspiration risk    Diet Recommendation NPO;Ice chips PRN after oral care (ice chips only when fully alert)   Liquid Administration via: Spoon Medication Administration: Crushed with puree Supervision: Staff to assist with self feeding;Full supervision/cueing for compensatory strategies    Other  Recommendations Oral Care Recommendations: Oral care QID    Recommendations for follow up therapy are one component of a multi-disciplinary discharge planning process, led by the attending physician.  Recommendations may be updated based on patient status, additional functional criteria and insurance authorization.  Follow up Recommendations Acute inpatient rehab (3hours/day)      Assistance Recommended at Discharge    Functional Status Assessment Patient has had a recent decline in  their functional status and demonstrates the ability to make significant improvements in function in a reasonable and predictable amount of time.  Frequency and Duration min 2x/week  2 weeks       Prognosis Prognosis for improved  oropharyngeal function: Good Barriers to Reach Goals: Cognitive deficits      Swallow Study   General HPI: Pt is an 88 yo male presenting via EMS with L sided weakness and headahce. Found to have R eye gaze preference, L hemianopia, L facial droop, L arm weakness, and dysarthria. MRI Brain 3/25 revealed unchanged large intraparenchymal hematoma centered in R temporal lobe and slightly increased blood within both lateral ventricles. PMH includes HTN, PVD, L occipital ICH Type of Study: Bedside Swallow Evaluation Previous Swallow Assessment: none in chart Diet Prior to this Study: NPO Temperature Spikes Noted: No Respiratory Status: Room air History of Recent Intubation: No Behavior/Cognition: Cooperative;Lethargic/Drowsy Oral Cavity Assessment: Dry Oral Care Completed by SLP: Yes Oral Cavity - Dentition: Adequate natural dentition Vision: Functional for self-feeding Self-Feeding Abilities: Needs assist Patient Positioning: Upright in bed Baseline Vocal Quality: Low vocal intensity Volitional Cough: Cognitively unable to elicit Volitional Swallow: Unable to elicit    Oral/Motor/Sensory Function Overall Oral Motor/Sensory Function: Mild impairment (pt not consistently following commands) Facial Symmetry: Abnormal symmetry left   Ice Chips Ice chips: Impaired Presentation: Spoon Oral Phase Impairments: Poor awareness of bolus Pharyngeal Phase Impairments: Throat Clearing - Immediate   Thin Liquid Thin Liquid: Impaired Presentation: Spoon;Straw Pharyngeal  Phase Impairments: Throat Clearing - Immediate    Nectar Thick Nectar Thick Liquid: Not tested   Honey Thick Honey Thick Liquid: Not tested   Puree Puree: Within functional limits Presentation: Spoon   Solid     Solid: Not tested      Fabio Asa., Student SLP  12/31/2022,10:48 AM

## 2023-01-01 ENCOUNTER — Inpatient Hospital Stay (HOSPITAL_COMMUNITY): Payer: Medicare PPO

## 2023-01-01 DIAGNOSIS — I611 Nontraumatic intracerebral hemorrhage in hemisphere, cortical: Secondary | ICD-10-CM | POA: Diagnosis not present

## 2023-01-01 LAB — SODIUM
Sodium: 154 mmol/L — ABNORMAL HIGH (ref 135–145)
Sodium: 158 mmol/L — ABNORMAL HIGH (ref 135–145)
Sodium: 158 mmol/L — ABNORMAL HIGH (ref 135–145)
Sodium: 150 mmol/L — ABNORMAL HIGH (ref 135–145)

## 2023-01-01 LAB — MAGNESIUM: Magnesium: 2.4 mg/dL (ref 1.7–2.4)

## 2023-01-01 LAB — BASIC METABOLIC PANEL
Anion gap: 6 (ref 5–15)
BUN: 27 mg/dL — ABNORMAL HIGH (ref 8–23)
CO2: 22 mmol/L (ref 22–32)
Calcium: 8.6 mg/dL — ABNORMAL LOW (ref 8.9–10.3)
Chloride: 129 mmol/L — ABNORMAL HIGH (ref 98–111)
Creatinine, Ser: 1.28 mg/dL — ABNORMAL HIGH (ref 0.61–1.24)
GFR, Estimated: 55 mL/min — ABNORMAL LOW (ref >60)
Glucose, Bld: 212 mg/dL — ABNORMAL HIGH (ref 70–99)
Potassium: 3.3 mmol/L — ABNORMAL LOW (ref 3.5–5.1)
Sodium: 157 mmol/L — ABNORMAL HIGH (ref 135–145)

## 2023-01-01 LAB — GLUCOSE, CAPILLARY
Glucose-Capillary: 133 mg/dL — ABNORMAL HIGH (ref 70–99)
Glucose-Capillary: 135 mg/dL — ABNORMAL HIGH (ref 70–99)
Glucose-Capillary: 136 mg/dL — ABNORMAL HIGH (ref 70–99)
Glucose-Capillary: 138 mg/dL — ABNORMAL HIGH (ref 70–99)
Glucose-Capillary: 168 mg/dL — ABNORMAL HIGH (ref 70–99)
Glucose-Capillary: 194 mg/dL — ABNORMAL HIGH (ref 70–99)

## 2023-01-01 LAB — PHOSPHORUS: Phosphorus: <1 mg/dL — CL (ref 2.5–4.6)

## 2023-01-01 LAB — HEMOGLOBIN A1C
Hgb A1c MFr Bld: 5.8 % — ABNORMAL HIGH (ref 4.8–5.6)
Mean Plasma Glucose: 120 mg/dL

## 2023-01-01 MED ORDER — VALPROATE SODIUM 100 MG/ML IV SOLN
500.0000 mg | Freq: Three times a day (TID) | INTRAVENOUS | Status: DC
  Administered 2023-01-01 – 2023-01-02 (×3): 500 mg via INTRAVENOUS
  Filled 2023-01-01 (×3): qty 5

## 2023-01-01 MED ORDER — POTASSIUM PHOSPHATES 15 MMOLE/5ML IV SOLN
30.0000 mmol | Freq: Once | INTRAVENOUS | Status: AC
  Administered 2023-01-01: 30 mmol via INTRAVENOUS
  Filled 2023-01-01: qty 10

## 2023-01-01 MED ORDER — SIMVASTATIN 20 MG PO TABS
40.0000 mg | ORAL_TABLET | Freq: Every day | ORAL | Status: DC
  Administered 2023-01-01 – 2023-01-03 (×3): 40 mg
  Filled 2023-01-01 (×3): qty 2

## 2023-01-01 MED ORDER — SENNOSIDES-DOCUSATE SODIUM 8.6-50 MG PO TABS
1.0000 | ORAL_TABLET | Freq: Two times a day (BID) | ORAL | Status: DC
  Administered 2023-01-01 – 2023-01-04 (×6): 1
  Filled 2023-01-01 (×6): qty 1

## 2023-01-01 MED ORDER — DOXAZOSIN MESYLATE 2 MG PO TABS
2.0000 mg | ORAL_TABLET | Freq: Every day | ORAL | Status: DC
  Administered 2023-01-01 – 2023-01-04 (×4): 2 mg
  Filled 2023-01-01 (×4): qty 1

## 2023-01-01 MED ORDER — FAMOTIDINE 20 MG PO TABS
20.0000 mg | ORAL_TABLET | Freq: Every day | ORAL | Status: DC
  Administered 2023-01-01 – 2023-01-04 (×4): 20 mg
  Filled 2023-01-01 (×4): qty 1

## 2023-01-01 MED ORDER — ADULT MULTIVITAMIN W/MINERALS CH
1.0000 | ORAL_TABLET | Freq: Every day | ORAL | Status: DC
  Administered 2023-01-02 – 2023-01-04 (×3): 1
  Filled 2023-01-01 (×3): qty 1

## 2023-01-01 MED ORDER — OSMOLITE 1.5 CAL PO LIQD
1000.0000 mL | ORAL | Status: DC
  Administered 2023-01-01 – 2023-01-03 (×3): 1000 mL

## 2023-01-01 MED ORDER — QUETIAPINE FUMARATE 25 MG PO TABS
12.5000 mg | ORAL_TABLET | Freq: Every day | ORAL | Status: DC
  Administered 2023-01-01: 12.5 mg
  Filled 2023-01-01: qty 1

## 2023-01-01 MED ORDER — HEPARIN SODIUM (PORCINE) 5000 UNIT/ML IJ SOLN
5000.0000 [IU] | Freq: Three times a day (TID) | INTRAMUSCULAR | Status: DC
  Administered 2023-01-01 – 2023-01-04 (×9): 5000 [IU] via SUBCUTANEOUS
  Filled 2023-01-01 (×8): qty 1

## 2023-01-01 MED ORDER — PROSOURCE TF20 ENFIT COMPATIBL EN LIQD
60.0000 mL | Freq: Two times a day (BID) | ENTERAL | Status: DC
  Administered 2023-01-01 – 2023-01-04 (×7): 60 mL
  Filled 2023-01-01 (×7): qty 60

## 2023-01-01 MED ORDER — DEXMEDETOMIDINE HCL IN NACL 400 MCG/100ML IV SOLN
0.0000 ug/kg/h | INTRAVENOUS | Status: DC
  Administered 2023-01-01 – 2023-01-03 (×4): 0.4 ug/kg/h via INTRAVENOUS
  Administered 2023-01-03: 0.2 ug/kg/h via INTRAVENOUS
  Administered 2023-01-03: 0.8 ug/kg/h via INTRAVENOUS
  Administered 2023-01-03: 0.4 ug/kg/h via INTRAVENOUS
  Administered 2023-01-03: 0.6 ug/kg/h via INTRAVENOUS
  Filled 2023-01-01: qty 200
  Filled 2023-01-01: qty 100
  Filled 2023-01-01: qty 300
  Filled 2023-01-01: qty 100

## 2023-01-01 MED ORDER — METOPROLOL TARTRATE 25 MG PO TABS
25.0000 mg | ORAL_TABLET | Freq: Two times a day (BID) | ORAL | Status: DC
  Administered 2023-01-01 – 2023-01-04 (×6): 25 mg
  Filled 2023-01-01 (×6): qty 1

## 2023-01-01 MED ORDER — LOSARTAN POTASSIUM 50 MG PO TABS
100.0000 mg | ORAL_TABLET | Freq: Every day | ORAL | Status: DC
  Administered 2023-01-02 – 2023-01-04 (×3): 100 mg
  Filled 2023-01-01 (×3): qty 2

## 2023-01-01 NOTE — Progress Notes (Signed)
Date and time results received: 01/01/23 1535 (use smartphrase ".now" to insert current time)  Test: phosphorus Critical Value: <1.0  Name of Provider Notified: Beulah Gandy, NP  Orders Received? Or Actions Taken?: Orders Received - See Orders for details

## 2023-01-01 NOTE — Evaluation (Signed)
Occupational Therapy Evaluation Patient Details Name: [name] Marks MRN: [id number] DOB: 12-05-1934 Today's Date: 01/01/2023   History of Present Illness 88 y.o. male with PMH significant for HTN, peripheral vascular disease, prior L occipital lobe ICH, SDH who presents with headache and L sided weakness. STAT CT Head demonstrates 23ml, multiloculated large R temporal lobe hematoma with R IVH, 24mm midline shift along with small overlying SDH.   Clinical Impression   Pt currently with functional limitations due to the deficits listed below (see OT Problem List). Prior to admit pt was living with his wife at home and independent with all ADL tasks and functional mobility. Pt will benefit from acute skilled OT to increase their safety and independence with ADL and functional mobility for ADL to facilitate discharge. Pt would benefit from intensive post acute therapy prior to returning home. OT will continue to follow patient acutely.        Recommendations for follow up therapy are one component of a multi-disciplinary discharge planning process, led by the attending physician.  Recommendations may be updated based on patient status, additional functional criteria and insurance authorization.   Assistance Recommended at Discharge Frequent or constant Supervision/Assistance  Patient can return home with the following Two people to help with walking and/or transfers;Two people to help with bathing/dressing/bathroom;Assistance with cooking/housework;Assistance with feeding;Assist for transportation;Help with stairs or ramp for entrance    Functional Status Assessment  Patient has had a recent decline in their functional status and demonstrates the ability to make significant improvements in function in a reasonable and predictable amount of time.  Equipment Recommendations  Other (comment) (TBD)    Recommendations for Other Services Rehab consult     Precautions / Restrictions  Precautions Precautions: Fall Precaution Comments: L and posterior lean Restrictions Weight Bearing Restrictions: No      Mobility Bed Mobility Overal bed mobility: Needs Assistance Bed Mobility: Supine to Sit, Sit to Supine     Supine to sit: Max assist, +2 for physical assistance, HOB elevated Sit to supine: Total assist, +2 for safety/equipment   General bed mobility comments: assist to lift trunk and initiate moving legs off EOB, assist for legs onto bed and to lower trunk. To return to bed, footboard removed and helicopter transition used.    Transfers     General transfer comment: NT due to safety      Balance Overall balance assessment: Needs assistance Sitting-balance support: Feet supported, Feet unsupported, Bilateral upper extremity supported Sitting balance-Leahy Scale: Zero Sitting balance - Comments: on EOB about 10 minutes working to place feet on the ground as pt with stiffness and pushing back and to L and extending legs in sitting. Manual assist provided to place feet on floor with applied proprioceptive pressure into knees/feet,pt appeared more grounded and able to lean forward when sitting with min A.     ADL either performed or assessed with clinical judgement   ADL Overall ADL's : Needs assistance/impaired Eating/Feeding: NPO         General ADL Comments: Pt is currently requiring total assist to complete all BADL tasks at bed level. 2 person assist is needed for all mobility.     Vision Baseline Vision/History: 1 Wears glasses (all the time) Ability to See in Adequate Light: 0 Adequate Patient Visual Report:  (unabel to report) Vision Assessment?:  (unable to assess vision due to lack of command following) Additional Comments: Kept eyes closed through majority of session. Did open his eyes briefly several times when asked.  Perception Perception Perception Tested?: Yes Perception Deficits: Spatial orientation (to be assessed  further.) Spatial deficits: impaired   Praxis Praxis Praxis tested?: Deficits Deficits: Organization;Initiation (to be assessed further)    Pertinent Vitals/Pain Pain Assessment Pain Assessment: Faces Faces Pain Scale: Hurts even more Pain Location: neck, head Pain Descriptors / Indicators: Grimacing, Aching, Discomfort, Moaning Pain Intervention(s): Monitored during session, Repositioned     Hand Dominance Right   Extremity/Trunk Assessment Upper Extremity Assessment Upper Extremity Assessment: Defer to OT evaluation LUE Deficits / Details: A/ROM appears functional although does not follow commands to move arm. During eval, pt moved UE purposefully. Demonstrated functional strength when seated EOB and pushing into extension. LUE Sensation:  (unable to assess) LUE Coordination:  (unable to assess grossly)   Lower Extremity Assessment Lower Extremity Assessment: RLE deficits/detail;LLE deficits/detail RLE Deficits / Details: AROM WFL, slowed movements, strength at least 3/5 not following for strength testing RLE Coordination: decreased gross motor LLE Deficits / Details: AAROM WFL, stiffness noted in sitting; strength not formally tested, but pt holding legs in extension in sitting so at least 3/5. LLE Coordination: decreased gross motor   Cervical / Trunk Assessment Cervical / Trunk Assessment: Other exceptions Cervical / Trunk Exceptions: stiffness in trunk and neck with pain upon transitions for neck and head   Communication Communication Communication: HOH   Cognition Arousal/Alertness: Lethargic Following Commands: Follows one step commands inconsistently, Follows one step commands with increased time Safety/Judgement: Decreased awareness of safety, Decreased awareness of deficits   Problem Solving: Slow processing, Decreased initiation, Difficulty sequencing, Requires verbal cues, Requires tactile cues       General Comments  daughter and wife in the room providing  history/PLOF.  BP sitting 99/80; in supine prior to mobility 144/56; on 2L O2 throughout            Home Living Family/patient expects to be discharged to:: Private residence Living Arrangements: Spouse/significant other Available Help at Discharge: Family;Available 24 hours/day Type of Home: House Home Access: Stairs to enter [employer] of Steps: 1 6-8" step from back   Home Layout: One level     Bathroom Shower/Tub: Teacher, early years/pre: Standard     Home Equipment: Tub bench;Grab bars - tub/shower;Rolling Walker (2 wheels)          Prior Functioning/Environment Prior Level of Function : Independent/Modified Independent             Mobility Comments: mowed the grass a week ago          OT Problem List: Decreased activity tolerance;Impaired balance (sitting and/or standing);Impaired vision/perception;Decreased coordination;Decreased cognition;Decreased safety awareness;Decreased knowledge of use of DME or AE;Decreased knowledge of precautions;Impaired sensation      OT Treatment/Interventions: Self-care/ADL training;Neuromuscular education;Therapeutic exercise;Energy conservation;DME and/or AE instruction;Manual therapy;Modalities;Therapeutic activities;Cognitive remediation/compensation;Visual/perceptual remediation/compensation;Patient/family education;Balance training    OT Goals(Current goals can be found in the care plan section) Acute Rehab OT Goals Patient Stated Goal: To lay down OT Goal Formulation: Patient unable to participate in goal setting Time For Goal Achievement: 01/15/23 Potential to Achieve Goals: Good  OT Frequency: Min 2X/week    Co-evaluation PT/OT/SLP Co-Evaluation/Treatment: Yes Reason for Co-Treatment: Complexity of the patient's impairments (multi-system involvement);To address functional/ADL transfers;For patient/therapist safety PT goals addressed during session: Mobility/safety with mobility;Balance OT  goals addressed during session: Strengthening/ROM      AM-PAC OT "6 Clicks" Daily Activity     Outcome Measure Help from another person eating meals?: Total Help from another person taking care of personal grooming?:  Total Help from another person toileting, which includes using toliet, bedpan, or urinal?: Total Help from another person bathing (including washing, rinsing, drying)?: Total Help from another person to put on and taking off regular upper body clothing?: Total Help from another person to put on and taking off regular lower body clothing?: Total 6 Click Score: 6   End of Session Equipment Utilized During Treatment: Oxygen Nurse Communication: Mobility status  Activity Tolerance: Patient limited by fatigue;Patient limited by pain Patient left: in bed;with call bell/phone within reach;with bed alarm set;with nursing/sitter in room;with family/visitor present  OT Visit Diagnosis: Unsteadiness on feet (R26.81);Muscle weakness (generalized) (M62.81);Ataxia, unspecified (R27.0);Other symptoms and signs involving cognitive function;Pain Pain - part of body:  (back and neck)                Time: [id number] OT Time Calculation (min): 31 min Charges:  OT General Charges $OT Visit: 1 Visit OT Evaluation $OT Eval High Complexity: 1 High  [employer], OTR/L,CBIS  Supplemental OT - [facility] and [facility] Secure Chat Preferred    Dane Kopke, Clarene Duke 01/01/2023, 11:48 AM

## 2023-01-01 NOTE — Progress Notes (Signed)
  Transition of Care [facility]) Screening Note   Patient Details  Name: SHERMON EDWARDSEN Date of Birth: [birth year]   Transition of Care [facility]) CM/SW Contact:    Benard Halsted, LCSW Phone Number: 01/01/2023, 4:19 PM    Transition of Care Department [facility]) has reviewed patient and will follow for therapy needs. Currently with [facility]. We will continue to monitor patient advancement through interdisciplinary progression rounds. If new patient transition needs arise, please place a TOC consult.

## 2023-01-01 NOTE — Progress Notes (Addendum)
STROKE TEAM PROGRESS NOTE   INTERVAL HISTORY His family is at the bedside.  Patient is sleepy, eyes are closed, will open them spontaneously.  He is oriented to self, has right gaze preference, and will intermittently follow commands He is still on Cleviprex at 8 mg an hour.  Still on 3% saline at 75 cc, will decrease to 50 cc an hour.  Last sodium was 158, will hold hypertonic saline for now and continue to monitor serial sodium levels CT head this morning was stable Due to patient being so drowsy will need to place core track tube and start tube feeds.  Now that patient has a tube begin to give p.o. BP meds and titrate Cleviprex drip off Patient also having urinary retention will start 2 mg Cardura via tube and subcu heparin  Vitals:   01/01/23 1145 01/01/23 1154 01/01/23 1200 01/01/23 1254  BP: (!) 153/92  (!) 144/57 (!) 146/61  Pulse: (!) 110  (!) 116 (!) 109  Resp: 16  14 (!) 21  Temp:  100.1 F (37.8 C)    TempSrc:  Axillary    SpO2: 96%  94% 93%  Weight:      Height:       CBC:  Recent Labs  Lab 12/30/22 2027 12/30/22 2032 12/31/22 0405  WBC 12.8*  --  13.0*  NEUTROABS 10.3*  --   --   HGB 13.0 13.3 12.3*  HCT 38.6* 39.0 37.5*  MCV 97.7  --  99.5  PLT 211  --  [id number]    Basic Metabolic Panel:  Recent Labs  Lab 12/30/22 2027 12/30/22 2032 12/30/22 2219 12/31/22 0405 12/31/22 1022 01/01/23 0310 01/01/23 0922  NA 137 140   < > 141   < > 154* 158*  K 4.1 4.2  --  4.4  --   --   --   CL 103 108  --  108  --   --   --   CO2 20*  --   --  20*  --   --   --   GLUCOSE 164* 156*  --  184*  --   --   --   BUN 20 21  --  18  --   --   --   CREATININE 1.19 1.10  --  1.24  --   --   --   CALCIUM 9.3  --   --  8.6*  --   --   --    < > = values in this interval not displayed.    Lipid Panel:  Recent Labs  Lab 12/31/22 1520  CHOL 128  TRIG 42  HDL 59  CHOLHDL 2.2  VLDL 8  LDLCALC 61   HgbA1c:  Recent Labs  Lab 12/31/22 1025  HGBA1C 5.8*   Urine Drug  Screen:  Recent Labs  Lab 12/31/22 0204  LABOPIA NONE DETECTED  COCAINSCRNUR NONE DETECTED  LABBENZ NONE DETECTED  AMPHETMU NONE DETECTED  THCU NONE DETECTED  LABBARB NONE DETECTED     Alcohol Level  Recent Labs  Lab 12/30/22 2219  ETH <10     IMAGING past 24 hours DG Abd Portable 1V  Result Date: 01/01/2023 CLINICAL DATA:  Enteric tube placement EXAM: PORTABLE ABDOMEN - 1 VIEW COMPARISON:  None Available. FINDINGS: Enteric tube tip projects over the proximal small bowel. Partially imaged nonobstructive bowel gas pattern. IMPRESSION: Enteric tube tip projects over the proximal small bowel. Electronically Signed   By: Shawn Route.D.  On: 01/01/2023 13:23   CT HEAD WO CONTRAST (5MM)  Result Date: 01/01/2023 CLINICAL DATA:  Stroke follow-up EXAM: CT HEAD WITHOUT CONTRAST TECHNIQUE: Contiguous axial images were obtained from the base of the skull through the vertex without intravenous contrast. RADIATION DOSE REDUCTION: This exam was performed according to the departmental dose-optimization program which includes automated exposure control, adjustment of the mA and/or kV according to patient size and/or use of iterative reconstruction technique. COMPARISON:  Yesterday FINDINGS: Brain: Extensive patchy hemorrhage in the right temporal lobe, unchanged in extent and shape. Patchy subarachnoid hemorrhage along the cerebral convexities is unchanged. Intraventricular blood clot layering in the lateral ventricles. No hydrocephalus. Unchanged brain swelling with leftward shift measuring 4 mm. Chronic left occipital infarct. Vascular: No hyperdense vessel or unexpected calcification. Skull: Normal. Negative for fracture or focal lesion. Sinuses/Orbits: No acute finding. IMPRESSION: Unchanged extensive hemorrhage in the right temporal lobe with intraventricular and subarachnoid extension. Midline shift towards the left measures 4 mm. No hydrocephalus. Electronically Signed   By: Jorje Guild M.D.    On: 01/01/2023 04:54    PHYSICAL EXAM  Temp:  [99 F (37.2 C)-100.1 F (37.8 C)] 100.1 F (37.8 C) (03/27 1154) Pulse Rate:  [84-127] 109 (03/27 1254) Resp:  [13-26] 21 (03/27 1254) BP: (99-166)/(48-122) 146/61 (03/27 1254) SpO2:  [89 %-99 %] 93 % (03/27 1254)  General - Well nourished, well developed elderly Caucasian male, in no apparent distress. Cardiovascular - Regular rhythm and rate.  Mental Status -  Patient lying comfortably with eyes closed with exhibiting eye-opening apraxia, but does occasionally open eyes to voice.  Patient intermittently followed commands of sticking tongue out and showing 2 fingers.  Patient does blink to threat on the right side and shows right gaze preference when eyes are forced open.  Motor Strength -left hemiparesis present, no movement on left side to command. However, during assessment patient did lift left arm up to face.  Does withdrawal to pain in all extremities.   Sensory -withdrew to pain in all extremities, right greater than left.  Coordination - unable to assess  Gait and Station - deferred.   ASSESSMENT/PLAN Mr.Jarod [name] Marks is a 88 y.o. male with PMH significant for HTN, peripheral vascular disease, prior L occipital lobe ICH, SDH who presents with headache and L sided weakness. He was found to have 19ml, multiloculated large R temporal lobe hematoma with R IVH, 53mm midline shift along with small overlying SDH. No trauma, not on AC. No obvious amyloidosis noted on prior MRI from 2020. Was hypertensive to 190s at one point but BP spontaneously down to [id number] systolic prior to any intervention.   Patient previously seen in 2020 for acute left occipital lobe hemorrhage, followed up with Dr. Leonie Man in his office.  Cerebral angio was ordered to explore etiology of AVM versus thrombosed vein.  No definitive aneurysm or abnormal blood vessel which could have caused brain hemorrhage was discovered during this procedure.   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 Intraventricular extension into the right lateral ventricle. Leftward midline shift of 6 mm. Repeat CT 3/26: Unchanged appearance of large intraparenchymal hematoma centered in the right temporal lobe with small volume overlying subarachnoid and subdural blood. There is now a small amount of subarachnoid blood visible over the posterior  left hemisphere. Blood within both lateral ventricles, slightly increased. Unchanged 5 mm leftward midline shift Repeat CT head 3/27: Unchanged right temporal lobe hemorrhage with IVH and subarachnoid extension with 4 mm shift  CTA head & neck: No emergent large vessel occlusion or hemodynamically significant stenosis of the head or neck. CT venogram: No dural venous sinus thrombosis   MRI:  Unchanged large area of hemorrhage in the right temporal lobe with overlying subdural and subarachnoid blood. Unchanged 5 mm leftward midline shift. Stippled contrast enhancement in the right temporal lobe, possibly due to blood brain barrier breakdown following hemorrhage. 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  2D Echo: LVEF greater than 75%, indeterminate diastolic filling due to ED-a fusion mildly elevated pulmonary artery systolic pressure, mild thickening of the aortic valve, aortic valve sclerosis present.  LDL 128 HgbA1c 5.8 VTE prophylaxis -start subcu heparin    Diet   Diet NPO time specified  [place] for ice chip/meds crushed with applesauce aspirin 81 mg daily prior to admission, now on No antithrombotic due to ICH/IVH. Patient has now had 2 ICHs, would recommend patient never be placed on blood thinners.  Therapy recommendations:  pending Disposition:  pending  Brain Compression with midline  shift Iatrogenic hypernatremia 3% Na @ 77ml/hr, currently off Na checks Q6h, Na goal 150-155 Last sodium 158 PICC line placed Can consider Neurosurgery eval if deterioration of mental status  STAT CT with any change in neuro status  Agitation Start Precedex low dose, until can start p.o. meds Start Seroquel 12.5 mg at bedtime Start Depakote 500 mg IV every 8  Intractable headache Tylenol as needed Depakote 500 mg IV every 8  Hypertensive Emergency Home meds: Losartan 100 mg, metoprolol 25 mg Stable SBP Goal  <160 On Cleviprex drip at 8 mg time to titrate off with the initiation of p.o. meds Currently on losartan 100 mg, metoprolol 25 mg twice daily Long-term BP goal normotensive  Hyperlipidemia Home meds: Simvastatin 40 mg restarted LDL 128., goal < 70 Continue statin at discharge  Dysphagia  Failed bedside swallow Core track tube placed today Start tube feeds Speech therapy to follow  other Stroke Risk Factors Advanced Age >/= 77  Former Cigarette smoker Obesity, Body mass index is 30.94 kg/m., BMI >/= 30 associated with increased stroke risk, recommend weight loss, diet and exercise as appropriate  Hx stroke/TIA  Other Active Problems Oliguria/urinary retention Unable to pass in/out catheter Coude catheter placed Strict I&Os Start Cardura 2 mg today  Hospital day # 2   Pt seen by Neuro NP/APP and later by MD. Note/plan to be edited by MD as needed.    Beulah Gandy DNP, ACNPC-AG  [facility]  I have personally obtained history,examined this patient, reviewed notes, independently viewed imaging studies, participated in medical decision making and plan of care.ROS completed by me personally and pertinent positives fully documented  I have made any additions or clarifications directly to the above note. Agree with note above.  Patient remains neurologically stable with eyes closed but partial response Eye-opening apraxia with dense left hemiplegia  and gaze defect.  CT scan shows stable appearance of the large right temporal parenchymal hemorrhage with cytotoxic edema and 4 mm right to left midline shift.  Hold hypertonic saline as serum sodium is above goal.  Insert panda tube for  nutrition and medicines as patient is unable to swallow.  Long discussion with patient's wife and daughter at the bedside and answered questions.This patient is critically ill and at significant risk  of neurological worsening, death and care requires constant monitoring of vital signs, hemodynamics,respiratory and cardiac monitoring, extensive review of multiple databases, frequent neurological assessment, discussion with family, other specialists and medical decision making of high complexity.I have made any additions or clarifications directly to the above note.This critical care time does not reflect procedure time, or teaching time or supervisory time of PA/NP/Med Resident etc but could involve care discussion time.  I spent 30 minutes of neurocritical care time  in the care of  this patient.      Antony Contras, MD Medical Director [facility] Pager: [phone] 01/01/2023 2:59 PM  To contact Stroke Continuity provider, please refer to [email or web address] After hours, contact General Neurology

## 2023-01-01 NOTE — Progress Notes (Signed)
Initial Nutrition Assessment  DOCUMENTATION CODES:   Not applicable  INTERVENTION:   Initiate tube feeding via cortrak tube: Osmolite 1.5 at 20 ml/h and increase by 10 ml every 8 hours to goal rate of 50 ml/hr (1200 ml per day) Prosource TF20 60 ml BID  Provides 1960 kcal, 115 gm protein, 912 ml free water daily   NUTRITION DIAGNOSIS:   Inadequate oral intake related to inability to eat as evidenced by NPO status.  GOAL:   Patient will meet greater than or equal to 90% of their needs  MONITOR:   TF tolerance  REASON FOR ASSESSMENT:    (cortrak order)    ASSESSMENT:   Pt with PMH of HTN, PVD, prior L occipital lobe ICH, and SDH admitted with large R ICH with IVH, brain compression, cytotoxic edema with midline shift etiology amyloid angiopathy vs hypertensive bleed.   Pt started on 3% with Na goal of 150-155.  Spoke with neurology, ok with starting TF today.  Spoke with pt's wife and daughter who are at bedside during cortrak placement.  They report pt is very active and has a good appetite. No recent weight loss per family.  Breakfast: bagel and peanut butter Lunch: sandwich Dinner: early sometimes out or at home, usually eats a full meal  3/27 - s/p cortrak placement; per xray tip in proximal small bowel   Medications reviewed and include: SSI, MVI with minerals, protonix, senokot-s  Cleviprex @ 16 ml/hr provides: 422 kcal  Hypertonic saline @ 50 ml/hr   Labs reviewed: Na 158 A1C: 5.8 CBG's: 120-148    NUTRITION - FOCUSED PHYSICAL EXAM:  Flowsheet Row Most Recent Value  Orbital Region No depletion  Upper Arm Region No depletion  Thoracic and Lumbar Region No depletion  Buccal Region Mild depletion  Temple Region No depletion  Clavicle Bone Region No depletion  Clavicle and Acromion Bone Region Mild depletion  Scapular Bone Region Unable to assess  Dorsal Hand No depletion  Patellar Region Moderate depletion  Anterior Thigh Region Moderate depletion   Posterior Calf Region Moderate depletion  Edema (RD Assessment) None  Hair Reviewed  Eyes Reviewed  Mouth Reviewed  Skin Reviewed  Nails Reviewed       Diet Order:   Diet Order             Diet NPO time specified  Diet effective now                   EDUCATION NEEDS:   Not appropriate for education at this time  Skin:  Skin Assessment: Reviewed RN Assessment  Last BM:  unknown  Height:   Ht Readings from Last 1 Encounters:  12/30/22 5\' 10"  (1.778 m)    Weight:   Wt Readings from Last 1 Encounters:  12/30/22 97.8 kg    BMI:  Body mass index is 30.94 kg/m.  Estimated Nutritional Needs:   Kcal:  1900-2200  Protein:  105-115 grams  Fluid:  >2 L/day  Lockie Pares., RD, LDN, CNSC See AMiON for contact information

## 2023-01-01 NOTE — Progress Notes (Signed)
OT Cancellation Note  Patient Details Name: [name] Marks MRN: [id number] DOB: [birth year]   Cancelled Treatment:    Reason Eval/Treat Not Completed: Active bedrest order (Will continue to follow patient and evaluate when bedrest orders have been discontinued.)  Ailene Ravel, OTR/L,CBIS  Supplemental OT - [facility] and [facility] Secure Chat Preferred   01/01/2023, 8:28 AM

## 2023-01-01 NOTE — Progress Notes (Signed)
Speech Language Pathology Treatment: Dysphagia (education)  Patient Details Name: [name] Marks MRN: [id number] DOB: Mar 26, 1935 Today's Date: 01/01/2023 Time: 1015-1050 SLP Time Calculation (min) (ACUTE ONLY): 35 min  Assessment / Plan / Recommendation Clinical Impression  Pt/family seen at bedside for skilled ST intervention targeting reassessment of PO readiness and education for pt's wife and daughter. Pt was unable to demonstrate ability to take a small ice chip from RN upon arrival of SLP. Pt is able to answer some questions (yes/no), but is unable to consistently follow directions (open mouth, stick out tongue, etc). Pt is currently not supported nutritionally. SLP provided oral care with suction, however only the outer surface of pt's teeth were cleaned. Pt was unable to follow command to open his mouth for cleaning of interior of oral cavity. RN informed.   Family was educated regarding the following: Oral care several times a day with suction/swabs is critical to minimize bacterial load and provide moisture to oral cavity.  Used swabs should be thrown away after 1 use, rather than allowed to soak in room temperature water (which would significantly increase bacteria on the swab. If this is then placed back in pt's mouth and aspiration occurs, pt is aspirating all that bacteria).  Family was receptive to discussion about the possibility of short term feeding tube placement to provide adequate and safe nutrition, hydration, and medication while therapies work to improve function. Will defer to the rest of the medical team for this decision.  Placement of cortrak in the left nare may reduce risk of pt pulling it out, due to left inattention/neglect.  Wife/daughter were encouraged to write information down to facilitate recall and assist with decision making, and to questions as they arise. SLP will continue to follow acutely for continued bedside assessment and recommendations. SLP recommends  consideration of Palliative Care consult to facilitate establishment of [name] Marks.   HPI HPI: Pt is an 88 yo male presenting via EMS with L sided weakness and headache. Found to have R eye gaze preference, L hemianopia, L facial droop, L arm weakness, and dysarthria. MRI Brain 3/25 revealed unchanged large intraparenchymal hematoma centered in R temporal lobe and slightly increased blood within both lateral ventricles. PMH includes HTN, PVD, L occipital ICH      SLP Plan  Continue with current plan of care      Recommendations for follow up therapy are one component of a multi-disciplinary discharge planning process, led by the attending physician.  Recommendations may be updated based on patient status, additional functional criteria and insurance authorization.    Recommendations  Diet recommendations: NPO Medication Administration: Via alternative means                  Oral care QID;Staff/trained caregiver to provide oral care   Frequent or constant Supervision/Assistance Dysphagia, unspecified (R13.10);Cognitive communication deficit (R41.841)     Continue with current plan of care    Shoni Quijas B. Quentin Ore, [facility], [place] Speech Language Pathologist Office: [phone]  Shonna Chock 01/01/2023, 11:19 AM

## 2023-01-01 NOTE — Progress Notes (Signed)
Inpatient Rehab Admissions Coordinator Note:  Per therapy recommendations recommendations, pt was screened for candidacy for CIR by Michel Santee, PT.  At this time, pt does not appear to be able to tolerate the intensity of CIR.  No consult recommended, however we will follow from a distance and if pt demonstrates improved tolerance we will place an order at that time per our protocol.    Shann Medal, PT, DPT [phone] @TODAY @ 4:38 PM

## 2023-01-01 NOTE — Procedures (Signed)
Cortrak  Person Inserting Tube:  Domonick Sittner T, RD Tube Type:  Cortrak - 43 inches Tube Size:  10 Tube Location:  Left nare Secured by: Bridle Technique Used to Measure Tube Placement:  Marking at nare/corner of mouth Cortrak Secured At:  71 cm   Cortrak Tube Team Note:  Consult received to place a Cortrak feeding tube.   X-ray is required, abdominal x-ray has been ordered by the Cortrak team. Please confirm tube placement before using the Cortrak tube.   If the tube becomes dislodged please keep the tube and contact the Cortrak team at [email or web address] for replacement.  If after hours and replacement cannot be delayed, place a NG tube and confirm placement with an abdominal x-ray.    [name] Marks RD, LDN For contact information, refer to AMiON.    

## 2023-01-01 NOTE — Evaluation (Signed)
Physical Therapy Evaluation Patient Details Name: [name] Marks MRN: [id number] DOB: Jan 30, 1935 Today's Date: 01/01/2023  History of Present Illness  Patient is an 88 y/o male admitted 12/30/22 with R headache, L side weakness and R gaze.  Found to have large R temporal hematoma with IVH and 6 mm midline shift with small SDH.  PMH positive for HTN, PVD, CAD, prior L occipital ICH, SDH.  Clinical Impression  Patient presents with decreased mobility due to L side weakness, decreased arousal and attention, poor deficit awareness, decreased sitting balance, decreased body/spatial awareness and pain.  Currently +2 max A for up to EOB and needing max to min A for sitting balance dependent on feet on the floor.  Patient previously independent living with his wife in one level home.  Daughter lives close and other family able to assist if needed.  PT will continue to follow in acute setting.  Feel he will benefit from intensive post-acute inpatient rehab prior to d/c home with family support.      Recommendations for follow up therapy are one component of a multi-disciplinary discharge planning process, led by the attending physician.  Recommendations may be updated based on patient status, additional functional criteria and insurance authorization.  Follow Up Recommendations       Assistance Recommended at Discharge Frequent or constant Supervision/Assistance  Patient can return home with the following  Two people to help with bathing/dressing/bathroom;Two people to help with walking and/or transfers;Assist for transportation;Assistance with cooking/housework;Help with stairs or ramp for entrance;Direct supervision/assist for financial management;Direct supervision/assist for medications management    Equipment Recommendations Other (comment) (to be assessed at next level of care)  Recommendations for Other Services  Rehab consult    Functional Status Assessment Patient has had a recent decline in  their functional status and demonstrates the ability to make significant improvements in function in a reasonable and predictable amount of time.     Precautions / Restrictions Precautions Precautions: Fall Precaution Comments: L and posterior lean      Mobility  Bed Mobility Overal bed mobility: Needs Assistance Bed Mobility: Supine to Sit, Sit to Supine     Supine to sit: HOB elevated, Max assist, +2 for physical assistance Sit to supine: Max assist, +2 for physical assistance   General bed mobility comments: assist to lift trunk and initiate moving legs off EOB, assist for legs onto bed and to lower trunk    Transfers                   General transfer comment: NT as pt with limited eye opening and poor body spatial awareness    Ambulation/Gait                  Stairs            Wheelchair Mobility    Modified Rankin (Stroke Patients Only) Modified Rankin (Stroke Patients Only) Pre-Morbid Rankin Score: No symptoms Modified Rankin: Severe disability     Balance Overall balance assessment: Needs assistance Sitting-balance support: Feet supported, Feet unsupported Sitting balance-Leahy Scale: Zero Sitting balance - Comments: on EOB about 10 minutes working to place feet on the ground as pt with stiffness and pushing back and to L and extending legs in sitting. once OT placed feet on floor and applied pressure into feet pt more grounded and able to lean forward sitting with min A  Pertinent Vitals/Pain Pain Assessment Pain Assessment: Faces Faces Pain Scale: Hurts even more Pain Location: neck, head Pain Descriptors / Indicators: Grimacing, Aching, Discomfort, Moaning Pain Intervention(s): Monitored during session, Repositioned, Patient requesting pain meds-RN notified    Home Living Family/patient expects to be discharged to:: Private residence Living Arrangements: Spouse/significant  other Available Help at Discharge: Family;Available 24 hours/day Type of Home: House Home Access: Stairs to enter   [employer] of Steps: 1 6-8" step from back   Home Layout: One level Home Equipment: Tub bench;Grab bars - tub/shower;Rolling Walker (2 wheels)      Prior Function Prior Level of Function : Independent/Modified Independent             Mobility Comments: mowed the grass a week ago       Hand Dominance   Dominant Hand: Right    Extremity/Trunk Assessment   Upper Extremity Assessment Upper Extremity Assessment: Defer to OT evaluation    Lower Extremity Assessment Lower Extremity Assessment: RLE deficits/detail;LLE deficits/detail RLE Deficits / Details: AROM WFL, slowed movements, strength at least 3/5 not following for strength testing RLE Coordination: decreased gross motor LLE Deficits / Details: AAROM WFL, stiffness noted in sitting; strength not formally tested, but pt holding legs in extension in sitting so at least 3/5. LLE Coordination: decreased gross motor    Cervical / Trunk Assessment Cervical / Trunk Assessment: Other exceptions Cervical / Trunk Exceptions: stiffness in trunk and neck with pain upon transitions for neck and head  Communication   Communication: HOH  Cognition Arousal/Alertness: Lethargic Behavior During Therapy: Flat affect Overall Cognitive Status: Impaired/Different from baseline Area of Impairment: Orientation, Attention, Following commands, Problem solving, Safety/judgement                 Orientation Level: Disoriented to, Place, Time, Situation Current Attention Level: Focused   Following Commands: Follows one step commands inconsistently, Follows one step commands with increased time Safety/Judgement: Decreased awareness of safety, Decreased awareness of deficits   Problem Solving: Slow processing, Decreased initiation, Difficulty sequencing, Requires verbal cues, Requires tactile cues           General Comments General comments (skin integrity, edema, etc.): daughter and wife in the room providing history/PLOF.  BP sitting 99/80; in supine prior to mobility 144/56; on 2L O2 throughout    Exercises     Assessment/Plan    PT Assessment Patient needs continued PT services  PT Problem List Decreased strength;Decreased cognition;Decreased mobility;Decreased coordination;Decreased balance;Pain;Decreased activity tolerance;Decreased safety awareness;Decreased knowledge of use of DME;Impaired tone       PT Treatment Interventions DME instruction;Balance training;Gait training;Neuromuscular re-education;Functional mobility training;Patient/family education;Cognitive remediation;Therapeutic exercise;Therapeutic activities    PT Goals (Current goals can be found in the Care Plan section)  Acute Rehab PT Goals Patient Stated Goal: to return home with assistance PT Goal Formulation: With family Time For Goal Achievement: 01/15/23 Potential to Achieve Goals: Good    Frequency Min 4X/week     Co-evaluation PT/OT/SLP Co-Evaluation/Treatment: Yes Reason for Co-Treatment: Complexity of the patient's impairments (multi-system involvement);To address functional/ADL transfers;For patient/therapist safety PT goals addressed during session: Mobility/safety with mobility;Balance         AM-PAC PT "6 Clicks" Mobility  Outcome Measure Help needed turning from your back to your side while in a flat bed without using bedrails?: Total Help needed moving from lying on your back to sitting on the side of a flat bed without using bedrails?: Total Help needed moving to and from a bed to a chair (  including a wheelchair)?: Total Help needed standing up from a chair using your arms (e.g., wheelchair or bedside chair)?: Total Help needed to walk in hospital room?: Total Help needed climbing 3-5 steps with a railing? : Total 6 Click Score: 6    End of Session Equipment Utilized During  Treatment: Oxygen Activity Tolerance: Patient limited by lethargy Patient left: in bed;with call bell/phone within reach;with restraints reapplied;with family/visitor present;with bed alarm set Nurse Communication: Mobility status PT Visit Diagnosis: Other abnormalities of gait and mobility (R26.89);Other symptoms and signs involving the nervous system (R29.898);Hemiplegia and hemiparesis Hemiplegia - Right/Left: Left Hemiplegia - dominant/non-dominant: Non-dominant Hemiplegia - caused by: Nontraumatic intracerebral hemorrhage    Time: 0919-0940 PT Time Calculation (min) (ACUTE ONLY): 21 min   Charges:   PT Evaluation $PT Eval Moderate Complexity: 1 Mod          Cyndi Jillisa Harris, PT Acute Rehabilitation Services Office:[phone] 01/01/2023   Reginia Naas 01/01/2023, 11:01 AM

## 2023-01-02 DIAGNOSIS — Z7189 Other specified counseling: Secondary | ICD-10-CM | POA: Diagnosis not present

## 2023-01-02 DIAGNOSIS — I629 Nontraumatic intracranial hemorrhage, unspecified: Secondary | ICD-10-CM

## 2023-01-02 DIAGNOSIS — I611 Nontraumatic intracerebral hemorrhage in hemisphere, cortical: Secondary | ICD-10-CM | POA: Diagnosis not present

## 2023-01-02 DIAGNOSIS — I616 Nontraumatic intracerebral hemorrhage, multiple localized: Secondary | ICD-10-CM | POA: Diagnosis not present

## 2023-01-02 LAB — CBC
HCT: 33.5 % — ABNORMAL LOW (ref 39.0–52.0)
Hemoglobin: 10.5 g/dL — ABNORMAL LOW (ref 13.0–17.0)
MCH: 32.2 pg (ref 26.0–34.0)
MCHC: 31.3 g/dL (ref 30.0–36.0)
MCV: 102.8 fL — ABNORMAL HIGH (ref 80.0–100.0)
Platelets: 171 10*3/uL (ref 150–400)
RBC: 3.26 MIL/uL — ABNORMAL LOW (ref 4.22–5.81)
RDW: 15.3 % (ref 11.5–15.5)
WBC: 17.5 10*3/uL — ABNORMAL HIGH (ref 4.0–10.5)
nRBC: 0 % (ref 0.0–0.2)

## 2023-01-02 LAB — BASIC METABOLIC PANEL
Anion gap: 13 (ref 5–15)
BUN: 42 mg/dL — ABNORMAL HIGH (ref 8–23)
CO2: 22 mmol/L (ref 22–32)
Calcium: 8.4 mg/dL — ABNORMAL LOW (ref 8.9–10.3)
Chloride: 124 mmol/L — ABNORMAL HIGH (ref 98–111)
Creatinine, Ser: 1.37 mg/dL — ABNORMAL HIGH (ref 0.61–1.24)
GFR, Estimated: 51 mL/min — ABNORMAL LOW (ref >60)
Glucose, Bld: 152 mg/dL — ABNORMAL HIGH (ref 70–99)
Potassium: 3.8 mmol/L (ref 3.5–5.1)
Sodium: 159 mmol/L — ABNORMAL HIGH (ref 135–145)

## 2023-01-02 LAB — SODIUM: Sodium: 157 mmol/L — ABNORMAL HIGH (ref 135–145)

## 2023-01-02 LAB — GLUCOSE, CAPILLARY
Glucose-Capillary: 121 mg/dL — ABNORMAL HIGH (ref 70–99)
Glucose-Capillary: 136 mg/dL — ABNORMAL HIGH (ref 70–99)
Glucose-Capillary: 136 mg/dL — ABNORMAL HIGH (ref 70–99)
Glucose-Capillary: 158 mg/dL — ABNORMAL HIGH (ref 70–99)
Glucose-Capillary: 161 mg/dL — ABNORMAL HIGH (ref 70–99)
Glucose-Capillary: 165 mg/dL — ABNORMAL HIGH (ref 70–99)

## 2023-01-02 LAB — PHOSPHORUS
Phosphorus: 2.6 mg/dL (ref 2.5–4.6)
Phosphorus: 2.5 mg/dL (ref 2.5–4.6)

## 2023-01-02 LAB — MAGNESIUM
Magnesium: 2.4 mg/dL (ref 1.7–2.4)
Magnesium: 2.6 mg/dL — ABNORMAL HIGH (ref 1.7–2.4)

## 2023-01-02 MED ORDER — QUETIAPINE FUMARATE 25 MG PO TABS
12.5000 mg | ORAL_TABLET | Freq: Every morning | ORAL | Status: DC
  Administered 2023-01-02: 12.5 mg

## 2023-01-02 MED ORDER — HYDRALAZINE HCL 20 MG/ML IJ SOLN
10.0000 mg | INTRAMUSCULAR | Status: DC | PRN
  Administered 2023-01-02 – 2023-01-04 (×3): 10 mg via INTRAVENOUS
  Filled 2023-01-02 (×3): qty 1

## 2023-01-02 MED ORDER — QUETIAPINE FUMARATE 25 MG PO TABS
25.0000 mg | ORAL_TABLET | Freq: Every day | ORAL | Status: DC
  Administered 2023-01-02 – 2023-01-03 (×2): 25 mg
  Filled 2023-01-02 (×3): qty 1

## 2023-01-02 MED ORDER — VALPROATE SODIUM 100 MG/ML IV SOLN
750.0000 mg | Freq: Three times a day (TID) | INTRAVENOUS | Status: DC
  Administered 2023-01-02 – 2023-01-04 (×6): 750 mg via INTRAVENOUS
  Filled 2023-01-02 (×9): qty 7.5

## 2023-01-02 MED ORDER — LABETALOL HCL 5 MG/ML IV SOLN
10.0000 mg | INTRAVENOUS | Status: DC | PRN
  Administered 2023-01-02 (×2): 10 mg via INTRAVENOUS
  Filled 2023-01-02 (×2): qty 4

## 2023-01-02 MED ORDER — QUETIAPINE FUMARATE 25 MG PO TABS
12.5000 mg | ORAL_TABLET | Freq: Every morning | ORAL | Status: DC
  Filled 2023-01-02: qty 1

## 2023-01-02 MED ORDER — HALOPERIDOL LACTATE 5 MG/ML IJ SOLN
2.0000 mg | Freq: Once | INTRAMUSCULAR | Status: AC
  Administered 2023-01-02: 2 mg via INTRAVENOUS
  Filled 2023-01-02: qty 1

## 2023-01-02 MED ORDER — DIPHENHYDRAMINE HCL 50 MG/ML IJ SOLN
25.0000 mg | Freq: Once | INTRAMUSCULAR | Status: AC
  Administered 2023-01-02: 25 mg via INTRAVENOUS
  Filled 2023-01-02: qty 1

## 2023-01-02 MED ORDER — QUETIAPINE FUMARATE 25 MG PO TABS: 12.5000 mg | ORAL_TABLET | Freq: Every morning | ORAL | Status: DC

## 2023-01-02 NOTE — Progress Notes (Addendum)
STROKE TEAM PROGRESS NOTE   INTERVAL HISTORY His wife and daughter are at the bedside.  [name] Marks continues to be very sleepy, will open them spontaneously to voice.  Continues to have right gaze preference and intermittently follows simple commands.  [name] Marks is off the Cleviprex.  We have DC'd the 3% saline as sodium was 159; will continue to monitor serial sodium levels.   Both Dr. Leonie Man and Palliative care had long discussion with family at the bedside. [name] Marks made a DNR. Family is considering comfort care measures and will discuss with family, pending their decision most likely tomorrow.   Vitals:   01/02/23 1154 01/02/23 1200 01/02/23 1300 01/02/23 1400  BP:  124/61 137/70 (!) 143/103  Pulse:  64 67 84  Resp:  (!) 27 13 14   Temp: 98.6 F (37 C)     TempSrc: Axillary     SpO2:  99% 100% 100%  Weight:      Height:       CBC:  Recent Labs  Lab 12/30/22 2027 12/30/22 2032 12/31/22 0405 01/02/23 0601  WBC 12.8*  --  13.0* 17.5*  NEUTROABS 10.3*  --   --   --   HGB 13.0   < > 12.3* 10.5*  HCT 38.6*   < > 37.5* 33.5*  MCV 97.7  --  99.5 102.8*  PLT 211  --  206 171   < > = values in this interval not displayed.    Basic Metabolic Panel:  Recent Labs  Lab 01/01/23 0922 01/01/23 1535 01/01/23 1648 01/01/23 1949 01/02/23 0601  NA 158*   < > 157* 150* 159*  K  --   --  3.3*  --  3.8  CL  --   --  129*  --  124*  CO2  --   --  22  --  22  GLUCOSE  --   --  212*  --  152*  BUN  --   --  27*  --  42*  CREATININE  --   --  1.28*  --  1.37*  CALCIUM  --   --  8.6*  --  8.4*  MG 2.4  --   --   --  2.4  PHOS <1.0*  --   --   --  2.6   < > = values in this interval not displayed.    Lipid Panel:  Recent Labs  Lab 12/31/22 1520  CHOL 128  TRIG 42  HDL 59  CHOLHDL 2.2  VLDL 8  LDLCALC 61    HgbA1c:  Recent Labs  Lab 12/31/22 1025  HGBA1C 5.8*    Urine Drug Screen:  Recent Labs  Lab 12/31/22 0204  LABOPIA NONE DETECTED  COCAINSCRNUR NONE DETECTED  LABBENZ  NONE DETECTED  AMPHETMU NONE DETECTED  THCU NONE DETECTED  LABBARB NONE DETECTED     Alcohol Level  Recent Labs  Lab 12/30/22 2219  ETH <10     IMAGING past 24 hours No results found.  PHYSICAL EXAM  Temp:  [98.5 F (36.9 C)-99.4 F (37.4 C)] 98.6 F (37 C) (03/28 1154) Pulse Rate:  [58-117] 84 (03/28 1400) Resp:  [13-27] 14 (03/28 1400) BP: (106-171)/(47-103) 143/103 (03/28 1400) SpO2:  [91 %-100 %] 100 % (03/28 1400) Weight:  [92.4 kg] 92.4 kg (03/28 0400)  General - Well nourished, well developed elderly Caucasian male, in no apparent distress. Cardiovascular - Regular rhythm and rate.  Mental Status -  [name] Marks lying comfortably with  eyes closed with exhibiting eye-opening apraxia, but does occasionally open eyes to voice.  [name] Marks intermittently followed commands of sticking tongue out and showing 2 fingers.  [name] Marks does blink to threat on the right side and shows right gaze preference when eyes are forced open.  Motor Strength -left hemiparesis present, no movement on left side to command.  Does withdrawal to pain in all extremities.   Sensory -withdrew to pain in all extremities, right greater than left.  Coordination - unable to assess  Gait and Station - deferred.   ASSESSMENT/PLAN [name] Marks is a 88 y.o. male with PMH significant for HTN, peripheral vascular disease, prior L occipital lobe ICH, SDH who presents with headache and L sided weakness. [name] Marks was found to have 17ml, multiloculated large R temporal lobe hematoma with R IVH, 28mm midline shift along with small overlying SDH. No trauma, not on AC. No obvious amyloidosis noted on prior MRI from 2020. Was hypertensive to 190s at one point but BP spontaneously down to [id number] systolic prior to any intervention.   [name] Marks previously seen in 2020 for acute left occipital lobe hemorrhage, followed up with Dr. Leonie Man in his office.  Cerebral angio was ordered to explore etiology of AVM versus thrombosed vein.   No definitive aneurysm or abnormal blood vessel which could have caused brain hemorrhage was discovered during this procedure.   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 Intraventricular extension into the right lateral ventricle. Leftward midline shift of 6 mm. Repeat CT 3/26: Unchanged appearance of large intraparenchymal hematoma centered in the right temporal lobe with small volume overlying subarachnoid and subdural blood. There is now a small amount of subarachnoid blood visible over the posterior left hemisphere. Blood within both lateral ventricles, slightly increased. Unchanged 5 mm leftward midline shift Repeat CT head 3/27: Unchanged right temporal lobe hemorrhage with IVH and subarachnoid extension with 4 mm shift  Palliative Care has been consulted. Family considering goals of care, [name] Marks made a DNR. Comfort Care discussions, possible decision tomorrow after family has had time to discuss with each other.    CTA head & neck: No emergent large vessel occlusion or hemodynamically significant stenosis of the head or neck. CT venogram: No dural venous sinus thrombosis   MRI:  Unchanged large area of hemorrhage in the right temporal lobe with overlying subdural and subarachnoid blood. Unchanged 5 mm leftward midline shift. Stippled contrast enhancement in the right temporal lobe, possibly due to blood brain barrier breakdown following hemorrhage. 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  2D Echo: LVEF greater than 75%, indeterminate diastolic filling due to ED-a fusion mildly elevated pulmonary artery systolic pressure, mild thickening of the aortic valve, aortic valve sclerosis present.  LDL 128 HgbA1c 5.8 VTE prophylaxis -start subcu  heparin    Diet   Diet NPO time specified  [place] for ice chip/meds crushed with applesauce aspirin 81 mg daily prior to admission, now on No antithrombotic due to ICH/IVH. [name] Marks has now had 2 ICHs, would recommend [name] Marks never be placed on blood thinners.  Therapy recommendations:  pending Disposition:  pending  Brain Compression with midline shift Iatrogenic hypernatremia 3% Na @ 21ml/hr, currently off Na checks Q6h, Na goal 150-155 Last sodium 159.  PICC line placed Can consider Neurosurgery eval if deterioration of mental  status  STAT CT with any change in neuro status  Agitation Start Precedex low dose, until can start p.o. meds Increase Seroquel to 25 mg at bedtime, 12.5 mg AM.  Increase Depakote to 750mg  IV every 8  Intractable headache Tylenol as needed Depakote 750mg  IV every 8  Hypertensive Emergency Home meds: Losartan 100 mg, metoprolol 25 mg Stable SBP Goal  <160 On Cleviprex drip at 8 mg time to titrate off with the initiation of p.o. meds Labetalol, Hydralazine IV PRNs added Currently on losartan 100 mg, metoprolol 25 mg twice daily Long-term BP goal normotensive  Hyperlipidemia Home meds: Simvastatin 40 mg restarted LDL 128., goal < 70 Continue statin at discharge  Dysphagia  Failed bedside swallow Core track tube placed today Start tube feeds Speech therapy to follow  other Stroke Risk Factors Advanced Age >/= 65  Former Cigarette smoker Obesity, Body mass index is 29.23 kg/m., BMI >/= 30 associated with increased stroke risk, recommend weight loss, diet and exercise as appropriate  Hx stroke/TIA  Other Active Problems Oliguria/urinary retention Unable to pass in/out catheter Coude catheter placed Strict I&Os Start Cardura 2 mg today  Hospital day # 3  I have personally obtained history,examined this [name] Marks, reviewed notes, independently viewed imaging studies, participated in medical decision making and plan of care.ROS completed by me  personally and pertinent positives fully documented  I have made any additions or clarifications directly to the above note. Agree with note above.  [name] Marks remains sleepy but can be aroused and follows only occasional commands and has eye-opening apraxia and left hemiplegia..  Chances of him recovering from this and making meaningful improvement on going home and managing his own affairs are quite remote.  [name] Marks will likely need prolonged nutrition support, PEG tube and nursing home care with family feels [name] Marks would not want.  Recommend palliative care consult to discuss goals of care with family and they will likely reinforce comfort care.  Long discussion held with [name] Marks's wife and daughter and answered questions.  This [name] Marks is critically ill and at significant risk of neurological worsening, death and care requires constant monitoring of vital signs, hemodynamics,respiratory and cardiac monitoring, extensive review of multiple databases, frequent neurological assessment, discussion with family, other specialists and medical decision making of high complexity.I have made any additions or clarifications directly to the above note.This critical care time does not reflect procedure time, or teaching time or supervisory time of PA/NP/Med Resident etc but could involve care discussion time.  I spent 30 minutes of neurocritical care time  in the care of  this [name] Marks.      Antony Contras, MD Medical Director [facility] Pager: [phone] 01/02/2023 4:01 PM   To contact Stroke Continuity provider, please refer to [email or web address] After hours, contact General Neurology

## 2023-01-02 NOTE — Progress Notes (Addendum)
PT Cancellation Note  Patient Details Name: [name] Marks MRN: [id number] DOB: 1935/02/09   Cancelled Treatment:    Reason Eval/Treat Not Completed: Fatigue/lethargy limiting ability to participate this morning. RN reports she just re-started sedating medicines due to continued restlessness and agitation, will continue to follow and attempt treatment when pt is more able to participate.   Addendum 11:24 AM: RN asked PT hold for today to allow patient time to rest while family awaiting palliative consult. Will plan to follow up tomorrow (3/29).  West Carbo, PT, DPT   Acute Rehabilitation Department Office [phone] Secure Chat Communication Preferred   Sandra Cockayne 01/02/2023, 8:50 AM

## 2023-01-02 NOTE — Consult Note (Addendum)
Consultation Note Date: 01/02/2023   Patient Name: [name] Marks  DOB: April 18, 1935  MRN: [id number]  Age / Sex: 88 y.o., male  PCP: Vicenta Aly, [place] Referring Physician: Stroke, Md, MD  Reason for Consultation: goals of care  HPI/Patient Profile: 88 y.o. male  with past medical history of CAD, HLD, HTN, ICH in 2020 admitted on 12/30/2022 with large R temporal hemorrhage with midline shift. Has had little improvements in mental status, minimally responsive. Some agitation today, requiring precedex for sedation.  Cortrak in place. Palliative medicine consulted for [place].   Primary Decision Maker NEXT OF KIN- spouse- Mortimer Fries, daughters- Kennyth Lose and Marcie Bal  Discussion: I have reviewed medical records including Care Everywhere, progress notes from this and prior admissions, labs and imaging, discussed with RN.  On evaluation patient is minimally responsive. He does not wake to my voice or touch.   I met with his spouse, Jolayne Haines, daughters Kennyth Lose and Marcie Bal, and sons in law [place] and [place]. There is one more son who was unavailable to meet.  I introduced Palliative Medicine as specialized medical care for people living with serious illness. It focuses on providing relief from the symptoms and stress of a serious illness. The goal is to improve quality of life for both the patient and the family.  We discussed a brief life review of the patient. He was in the [employer]. He and his wife have been married for 65 years. He is known to be very loving, greatly enjoys the special relationships he has with his grandchildren. [employer] faith. Very protective of his wife, Jolayne Haines.   As far as functional and nutritional status prior to admission he was living independently with his wife at home. He was very active, loved to mow his lawn.    We discussed patient's current illness and what it means in the larger context of  patient's on-going co-morbidities.  Natural disease trajectory and expectations at EOL were discussed.  Family understands the critical nature of patient's brain injury and possible trajectories.   We discussed possible paths of care including continued aggressive medical interventions with goal of prolonging time including feeding tube, nursing home placement and following progress. We discussed transition to comfort measures only and allowing for natural dying process with symptom management which includes stopping IV fluids, antibiotics, labs and providing symptom management for SOB, anxiety, nausea, vomiting, and other symptoms of dying.   We discuss patient's prognosis and potential outcomes. We discussed best case scenario post stroke which would include improving well and rehabilitation. We discussed the worst case which is little improvement and continued complications. I shared that often patient end up somewhere in the middle requiring a new level of care often for the rest of their life. We discussed that the nature of his injury and other comorbidities indicate that he is unlikely to recover to a point where he will be able to mow his lawn and live independently.   I attempted to elicit values and goals of care important to the patient. Patient would not  be happy to be in a bed bound state. He would not want his life prolonged artificially.    Code status- Encouraged patient/family to consider DNR status understanding evidenced based poor outcomes in similar hospitalized patients, as the cause of the arrest is likely associated with chronic/terminal disease rather than a reversible acute cardio-pulmonary event. They are all in agreement with DNR.   Discussed with patient/family the importance of continued conversation with family and the medical providers regarding overall plan of care and treatment options, ensuring decisions are within the context of the patient's values and GOCs.     Hospice and Palliative Care services outpatient were explained and offered. Hard choices book was provided.  Questions and concerns were addressed. The family was encouraged to call with questions or concerns.    SUMMARY OF RECOMMENDATIONS -DNR, limited interventions for now -Family leaning strongly to comfort measures and requesting inpatient hospice evaluation- they would like to take tonight to discuss with other family members and will follow-up tomorrow with final decision -PMT will contact daughter Kennyth Lose tomorrow for follow-up    Code Status/Advance Care Planning: DNR   Prognosis:   Unable to determine- if he were transitioned to full comfort measures only I would anticipate less than 2 weeks  Discharge Planning: To Be Determined  Primary Diagnoses: Present on Admission:  ICH (intracerebral hemorrhage) ([place])   Review of Systems  Unable to perform ROS: Mental status change    Physical Exam Vitals and nursing note reviewed.  Constitutional:      General: He is not in acute distress.    Appearance: He is ill-appearing.  HENT:     Nose:     Comments: Cortrak inplace Pulmonary:     Effort: Pulmonary effort is normal.     Vital Signs: BP 124/61   Pulse 64   Temp 98.6 F (37 C) (Axillary)   Resp (!) 27   Ht 5\' 10"  (1.778 m)   Wt 92.4 kg   SpO2 99%   BMI 29.23 kg/m  Pain Scale: CPOT   Pain Score: Asleep   SpO2: SpO2: 99 % O2 Device:SpO2: 99 % O2 Flow Rate: .O2 Flow Rate (L/min): 2 L/min  IO: Intake/output summary:  Intake/Output Summary (Last 24 hours) at 01/02/2023 1323 Last data filed at 01/02/2023 0900 Gross per 24 hour  Intake 1683.12 ml  Output 1300 ml  Net 383.12 ml    LBM: Last BM Date :  (PTA) Baseline Weight: Weight: 97.8 kg Most recent weight: Weight: 92.4 kg       Thank you for this consult. Palliative medicine will continue to follow and assist as needed.   Time Total: 120 minutes  Greater than 50%  of this time was spent  counseling and coordinating care related to the above assessment and plan.  Signed by: Mariana Kaufman, AGNP-C Palliative Medicine    Please contact Palliative Medicine Team phone at [phone] for questions and concerns.  For individual provider: See Shea Evans

## 2023-01-03 DIAGNOSIS — Z66 Do not resuscitate: Secondary | ICD-10-CM | POA: Diagnosis not present

## 2023-01-03 DIAGNOSIS — I611 Nontraumatic intracerebral hemorrhage in hemisphere, cortical: Secondary | ICD-10-CM | POA: Diagnosis not present

## 2023-01-03 DIAGNOSIS — Z515 Encounter for palliative care: Secondary | ICD-10-CM | POA: Diagnosis not present

## 2023-01-03 DIAGNOSIS — I616 Nontraumatic intracerebral hemorrhage, multiple localized: Secondary | ICD-10-CM | POA: Diagnosis not present

## 2023-01-03 DIAGNOSIS — Z7189 Other specified counseling: Secondary | ICD-10-CM | POA: Diagnosis not present

## 2023-01-03 LAB — PHOSPHORUS
Phosphorus: 2.2 mg/dL — ABNORMAL LOW (ref 2.5–4.6)
Phosphorus: 4.5 mg/dL (ref 2.5–4.6)

## 2023-01-03 LAB — BASIC METABOLIC PANEL
Anion gap: 15 (ref 5–15)
BUN: 38 mg/dL — ABNORMAL HIGH (ref 8–23)
CO2: 22 mmol/L (ref 22–32)
Calcium: 8.2 mg/dL — ABNORMAL LOW (ref 8.9–10.3)
Chloride: 120 mmol/L — ABNORMAL HIGH (ref 98–111)
Creatinine, Ser: 1.03 mg/dL (ref 0.61–1.24)
GFR, Estimated: >60 mL/min (ref >60)
Glucose, Bld: 161 mg/dL — ABNORMAL HIGH (ref 70–99)
Potassium: 3.1 mmol/L — ABNORMAL LOW (ref 3.5–5.1)
Sodium: 157 mmol/L — ABNORMAL HIGH (ref 135–145)

## 2023-01-03 LAB — VALPROIC ACID LEVEL: Valproic Acid Lvl: 76 ug/mL (ref 50.0–100.0)

## 2023-01-03 LAB — MAGNESIUM
Magnesium: 2.3 mg/dL (ref 1.7–2.4)
Magnesium: 2.5 mg/dL — ABNORMAL HIGH (ref 1.7–2.4)

## 2023-01-03 LAB — CBC
HCT: 36.2 % — ABNORMAL LOW (ref 39.0–52.0)
Hemoglobin: 12.3 g/dL — ABNORMAL LOW (ref 13.0–17.0)
MCH: 33.3 pg (ref 26.0–34.0)
MCHC: 34 g/dL (ref 30.0–36.0)
MCV: 98.1 fL (ref 80.0–100.0)
Platelets: 167 10*3/uL (ref 150–400)
RBC: 3.69 MIL/uL — ABNORMAL LOW (ref 4.22–5.81)
RDW: 15 % (ref 11.5–15.5)
WBC: 13.2 10*3/uL — ABNORMAL HIGH (ref 4.0–10.5)
nRBC: 0 % (ref 0.0–0.2)

## 2023-01-03 LAB — GLUCOSE, CAPILLARY
Glucose-Capillary: 104 mg/dL — ABNORMAL HIGH (ref 70–99)
Glucose-Capillary: 130 mg/dL — ABNORMAL HIGH (ref 70–99)
Glucose-Capillary: 133 mg/dL — ABNORMAL HIGH (ref 70–99)
Glucose-Capillary: 140 mg/dL — ABNORMAL HIGH (ref 70–99)
Glucose-Capillary: 144 mg/dL — ABNORMAL HIGH (ref 70–99)
Glucose-Capillary: 166 mg/dL — ABNORMAL HIGH (ref 70–99)

## 2023-01-03 MED ORDER — ISOSORBIDE MONONITRATE ER 30 MG PO TB24
90.0000 mg | ORAL_TABLET | Freq: Every day | ORAL | Status: DC
  Filled 2023-01-03: qty 3

## 2023-01-03 MED ORDER — POTASSIUM PHOSPHATES 15 MMOLE/5ML IV SOLN
30.0000 mmol | Freq: Once | INTRAVENOUS | Status: AC
  Administered 2023-01-03: 30 mmol via INTRAVENOUS
  Filled 2023-01-03: qty 10

## 2023-01-03 MED ORDER — ACETAMINOPHEN-CODEINE 300-30 MG PO TABS
2.0000 | ORAL_TABLET | Freq: Four times a day (QID) | ORAL | Status: DC | PRN
  Filled 2023-01-03: qty 2

## 2023-01-03 MED ORDER — ACETAMINOPHEN-CODEINE 300-30 MG PO TABS
2.0000 | ORAL_TABLET | Freq: Four times a day (QID) | ORAL | Status: DC | PRN
  Administered 2023-01-03 – 2023-01-04 (×3): 2
  Filled 2023-01-03 (×2): qty 2

## 2023-01-03 MED ORDER — ISOSORBIDE MONONITRATE ER 30 MG PO TB24: 30.0000 mg | ORAL_TABLET | Freq: Once | ORAL | Status: DC

## 2023-01-03 NOTE — Progress Notes (Addendum)
STROKE TEAM PROGRESS NOTE   INTERVAL HISTORY  Patient's wife and daughters are at bedside.  Patient continues to be very somnolent, intermittently follows simple commands.  A.m. dose of Seroquel was DC'd to see if this would help patient's mentation at all.  However we did explain to the family that this is expected presentation with the size of brain bleed that the patient does have.  Further discussion was held about goals of care, comfort care measures, PEG tube process.  Family would like to give patient through the weekend to make improvements and will make their decision on Monday regarding comfort care measures 3% saline was discontinued yesterday we will continue to monitor serial sodium levels.  Potassium and phosphorus was replaced.  Wean Precedex as tolerated.  Patient will remain in the ICU until Precedex is able to be weaned off   Vitals:   01/03/23 0900 01/03/23 1000 01/03/23 1100 01/03/23 1200  BP: (!) 133/58 134/61 (!) 142/61 (!) 135/57  Pulse: 71 71 70 71  Resp: 19 20 (!) 21 (!) 8  Temp:      TempSrc:      SpO2: 95% 96% 97% 97%  Weight:      Height:       CBC:  Recent Labs  Lab 12/23/2022 2027 12/22/2022 2032 01/02/23 0601 01/03/23 0525  WBC 12.8*   < > 17.5* 13.2*  NEUTROABS 10.3*  --   --   --   HGB 13.0   < > 10.5* 12.3*  HCT 38.6*   < > 33.5* 36.2*  MCV 97.7   < > 102.8* 98.1  PLT 211   < > 171 167   < > = values in this interval not displayed.    Basic Metabolic Panel:  Recent Labs  Lab 01/02/23 0601 01/02/23 1700 01/03/23 0525  NA 159* 157* 157*  K 3.8  --  3.1*  CL 124*  --  120*  CO2 22  --  22  GLUCOSE 152*  --  161*  BUN 42*  --  38*  CREATININE 1.37*  --  1.03  CALCIUM 8.4*  --  8.2*  MG 2.4 2.6* 2.3  PHOS 2.6 2.5 2.2*    Lipid Panel:  Recent Labs  Lab 12/31/22 1520  CHOL 128  TRIG 42  HDL 59  CHOLHDL 2.2  VLDL 8  LDLCALC 61    HgbA1c:  Recent Labs  Lab 12/31/22 1025  HGBA1C 5.8*    Urine Drug Screen:  Recent Labs  Lab  12/31/22 0204  LABOPIA NONE DETECTED  COCAINSCRNUR NONE DETECTED  LABBENZ NONE DETECTED  AMPHETMU NONE DETECTED  THCU NONE DETECTED  LABBARB NONE DETECTED     Alcohol Level  Recent Labs  Lab 01/04/2023 2219  ETH <10     IMAGING past 24 hours No results found.  PHYSICAL EXAM  Temp:  [98.7 F (37.1 C)-99.5 F (37.5 C)] 99.2 F (37.3 C) (03/29 0800) Pulse Rate:  [63-91] 71 (03/29 1200) Resp:  [8-22] 8 (03/29 1200) BP: (117-169)/(52-80) 135/57 (03/29 1200) SpO2:  [94 %-100 %] 97 % (03/29 1200)  General - Well nourished, well developed elderly Caucasian male, in no apparent distress. Cardiovascular - Regular rhythm and rate.  Mental Status -  Patient lying comfortably with eyes closed with exhibiting eye-opening apraxia, but does occasionally open eyes to voice.  Patient intermittently followed commands of sticking tongue out and showing 2 fingers.  Patient does blink to threat on the right side and shows right  gaze preference when eyes are forced open.  Motor Strength -left hemiparesis present, no movement on left side to command.  Does withdrawal to pain in all extremities.   Sensory -withdrew to pain in all extremities, right greater than left.  Coordination - unable to assess  Gait and Station - deferred.   ASSESSMENT/PLAN [name] is a 88 y.o. male with PMH significant for HTN, peripheral vascular disease, prior L occipital lobe ICH, SDH who presents with headache and L sided weakness. He was found to have 22ml, multiloculated large R temporal lobe hematoma with R IVH, 73mm midline shift along with small overlying SDH. No trauma, not on AC. No obvious amyloidosis noted on prior MRI from 2020. Was hypertensive to 190s at one point but BP spontaneously down to [id number] systolic prior to any intervention.   Patient previously seen in 2020 for acute left occipital lobe hemorrhage, followed up with Dr. Leonie Man in his office.  Cerebral angio was ordered to explore etiology  of AVM versus thrombosed vein.  No definitive aneurysm or abnormal blood vessel which could have caused brain hemorrhage was discovered during this procedure.   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 Intraventricular extension into the right lateral ventricle. Leftward midline shift of 6 mm. Repeat CT 3/26: Unchanged appearance of large intraparenchymal hematoma centered in the right temporal lobe with small volume overlying subarachnoid and subdural blood. There is now a small amount of subarachnoid blood visible over the posterior left hemisphere. Blood within both lateral ventricles, slightly increased. Unchanged 5 mm leftward midline shift Repeat CT head 3/27: Unchanged right temporal lobe hemorrhage with IVH and subarachnoid extension with 4 mm shift  Palliative Care has been consulted. Family considering goals of care, patient made a DNR. Comfort Care discussions, possible decision tomorrow after family has had time to discuss with each other.    CTA head & neck: No emergent large vessel occlusion or hemodynamically significant stenosis of the head or neck. CT venogram: No dural venous sinus thrombosis   MRI:  Unchanged large area of hemorrhage in the right temporal lobe with overlying subdural and subarachnoid blood. Unchanged 5 mm leftward midline shift. Stippled contrast enhancement in the right temporal lobe, possibly due to blood brain barrier breakdown following hemorrhage. 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  2D Echo: LVEF greater than 75%, indeterminate diastolic filling due to ED-a fusion mildly elevated pulmonary artery systolic pressure, mild thickening of the aortic valve, aortic valve sclerosis present.  LDL 128 HgbA1c  5.8 VTE prophylaxis -start subcu heparin    Diet   Diet NPO time specified  [place] for ice chip/meds crushed with applesauce aspirin 81 mg daily prior to admission, now on No antithrombotic due to ICH/IVH. Patient has now had 2 ICHs, would recommend patient never be placed on blood thinners.  Therapy recommendations:  pending Disposition:  pending.  Extensive conversations have been had with patient's family about poor prognosis due to the size of the bleed combined with brain compression and midline shift.  Family has made the patient a DNR and wants to give him through the weekend, and will make their decision Monday regarding comfort care measures.  Brain Compression with midline shift Iatrogenic hypernatremia 3% Na @ 27ml/hr, currently off Na checks Q6h, Na goal 150-155 Last sodium  159.  PICC line placed Can consider Neurosurgery eval if deterioration of mental status  STAT CT with any change in neuro status  Intermittent Agitation Continue Precedex low dose, wean as able.  Continue Seroquel to 25 mg at bedtime, 12.5 mg AM dose dc'd.  Continue  Depakote to 750mg  IV every 8 Level today WNL  Intractable headache Tylenol as needed Tylenol 3 added Depakote 750mg  IV every 8 Level today WNL  Hypertensive Emergency Home meds: Losartan 100 mg, metoprolol 25 mg Stable SBP Goal  <160 Labetalol, Hydralazine IV PRNs  Currently on losartan 100 mg, metoprolol 25 mg twice daily Long-term BP goal normotensive  Hyperlipidemia Home meds: Simvastatin 40 mg restarted LDL 128., goal < 70 Continue statin at discharge  Dysphagia  Core track tube placed  Continue tube feeds Speech therapy to follow  other Stroke Risk Factors Advanced Age >/= 43  Former Cigarette smoker Obesity, Body mass index is 29.23 kg/m., BMI >/= 30 associated with increased stroke risk, recommend weight loss, diet and exercise as appropriate  Hx stroke/TIA  Other Active Problems Oliguria/urinary  retention Unable to pass in/out catheter Coude catheter placed Strict I&Os Start Cardura 2 mg Hypokalemia Replaced, recheck in AM Hypophosphatemia Replaced, recheck in AM  Hospital day # 4  I have personally obtained history,examined this patient, reviewed notes, independently viewed imaging studies, participated in medical decision making and plan of care.ROS completed by me personally and pertinent positives fully documented  I have made any additions or clarifications directly to the above note. Agree with note above.  Patient neurological condition remains essentially unchanged with patient quite sleepy and barely opening eyes and following commands.  He does get better.  He remains on Precedex drip Depacon.  Family is realistic and understands his prognosis but would like to support him for few days.  They agree with DNR and do not escalate care and includes comfort care over the next few days with his condition declines further.  Long discussion with patient wife and daughter at the bedside and answered questions. This patient is critically ill and at significant risk of neurological worsening, death and care requires constant monitoring of vital signs, hemodynamics,respiratory and cardiac monitoring, extensive review of multiple databases, frequent neurological assessment, discussion with family, other specialists and medical decision making of high complexity.I have made any additions or clarifications directly to the above note.This critical care time does not reflect procedure time, or teaching time or supervisory time of PA/NP/Med Resident etc but could involve care discussion time.  I spent 30 minutes of neurocritical care time  in the care of  this patient.     [name] Contras, MD Medical Director [facility] Pager: [phone] 01/03/2023 3:13 PM

## 2023-01-03 NOTE — Progress Notes (Signed)
Inpatient Rehab Admissions Coordinator:   Pt continues to have low tolerance for mobility 2/2 LOC.  Family looking into hospice.  No CIR needs at this time.   Shann Medal, PT, DPT Admissions Coordinator [phone] 01/03/23  2:25 PM

## 2023-01-03 NOTE — Progress Notes (Signed)
Daily Progress Note   Patient Name: [name] Marks       Date: 01/03/2023 DOB: [birth year]  Age: 88 y.o. MRN#: [id number] Attending Physician: Stroke, Md, MD Primary Care Physician: Vicenta Aly, FNP Admit Date: 12/20/2022  Reason for Consultation/Follow-up: Establishing goals of care  Subjective: Patient does not respond to voice or gentle touch  Length of Stay: 4  Current Medications: Scheduled Meds:   Chlorhexidine Gluconate Cloth  6 each Topical Daily   doxazosin  2 mg Per Tube Daily   famotidine  20 mg Per Tube Daily   feeding supplement (PROSource TF20)  60 mL Per Tube BID   heparin injection (subcutaneous)  5,000 Units Subcutaneous Q8H   insulin aspart  0-15 Units Subcutaneous Q4H   isosorbide mononitrate  30 mg Oral Once   [START ON 01/04/2023] isosorbide mononitrate  90 mg Oral Daily   losartan  100 mg Per Tube Daily   metoprolol tartrate  25 mg Per Tube BID   multivitamin with minerals  1 tablet Per Tube Daily   mouth rinse  15 mL Mouth Rinse 4 times per day   QUEtiapine  25 mg Per Tube QHS   senna-docusate  1 tablet Per Tube BID   simvastatin  40 mg Per Tube QHS   sodium chloride flush  10-40 mL Intracatheter Q12H    Continuous Infusions:  clevidipine Stopped (01/01/23 2016)   dexmedetomidine (PRECEDEX) IV infusion 0.4 mcg/kg/hr (01/03/23 1300)   feeding supplement (OSMOLITE 1.5 CAL) 1,000 mL (01/03/23 1557)   valproate sodium 750 mg (01/03/23 1555)    PRN Meds: acetaminophen **OR** acetaminophen (TYLENOL) oral liquid 160 mg/5 mL **OR** acetaminophen, acetaminophen-codeine, hydrALAZINE, labetalol, ondansetron (ZOFRAN) IV, mouth rinse, sodium chloride flush  Physical Exam Constitutional:      General: He is not in acute distress.    Appearance: He is ill-appearing.      Comments: No signs of discomfort  HENT:     Head:     Comments: Cortrak in place Pulmonary:     Effort: Pulmonary effort is normal.  Skin:    General: Skin is warm and dry.             Vital Signs: BP (!) 141/69   Pulse 64   Temp 99.5 F (37.5 C) (Axillary)   Resp 16   Ht 5\' 10"  (1.778 m)   Wt 92.4 kg   SpO2 97%   BMI 29.23 kg/m  SpO2: SpO2: 97 % O2 Device: O2 Device: Room Air O2 Flow Rate: O2 Flow Rate (L/min): 2 L/min  Intake/output summary:  Intake/Output Summary (Last 24 hours) at 01/03/2023 1714 Last data filed at 01/03/2023 1557 Gross per 24 hour  Intake 1816.5 ml  Output 1150 ml  Net 666.5 ml   LBM: Last BM Date :  (PTA) Baseline Weight: Weight: 97.8 kg Most recent weight: Weight: 92.4 kg   Patient Active Problem List   Diagnosis Date Noted   OSA (obstructive sleep apnea) 05/31/2021   ICH (intracerebral hemorrhage) ([place]) 05/21/2019   Impingement syndrome of left shoulder 10/17/2016   Acute pain of left shoulder 09/19/2016   Preop testing 12/06/2011   CHEST PAIN UNSPECIFIED 03/13/2010   HEARTBURN 08/18/2009  Hypertension 04/19/2009   ANGINA, CHRONIC 04/19/2009   Dyslipidemia 11/19/2007   CORONARY ARTERY DISEASE, S/P PTCA 11/19/2007   NEOPLASM, MALIGNANT, PROSTATE, HX OF 11/19/2007   NEPHROLITHIASIS, HX OF 11/19/2007    Palliative Care Assessment & Plan   HPI: 88 y.o. male  with past medical history of CAD, HLD, HTN, ICH in 2020 admitted on 12/14/2022 with large R temporal hemorrhage with midline shift. Has had little improvements in mental status, minimally responsive. Some agitation today, requiring precedex for sedation.  Cortrak in place. Palliative medicine consulted for [place].   Assessment: Follow-up today with family members at bedside to include 2 daughters and wife. They all share they understand situation and understand concern about poor outcome.  They share they had discussion with physician this morning and following discussion they would  like to continue current measures over the weekend to give patient a little more time to determine outcomes.  They also share about another family member who will be unable to visit until over the weekend so they would also like time for this. We discussed follow-up on Monday to determine how to move forward.  They are agreeable to this and feel that giving a little more time before deciding comfort care hospice will give him more confidence in their decision.  Family does share they do not want medical interventions escalated -maintain current level of care.  All questions and concerns addressed.  Emotional support provided.  Recommendations/Plan: Family request continuing current care over the weekend in meeting with PMT again Monday to discuss steps moving forward, if patient remains the same family interested in discussing comfort measures/hospice  Code Status: DNR  Care plan was discussed with patient's family members to include 2 daughters and wife  Thank you for allowing the Palliative Medicine Team to assist in the care of this patient.  *Please note that this is a verbal dictation therefore any spelling or grammatical errors are due to the "[employer]" system interpretation.  Juel Burrow, DNP, [facility] Palliative Medicine Team Team Phone # [phone]  Pager [phone]

## 2023-01-03 NOTE — Progress Notes (Signed)
Occupational Therapy Treatment Patient Details Name: [name] Marks MRN: [id number] DOB: 11/29/1934 Today's Date: 01/03/2023   History of present illness 88 y.o. male with PMH significant for HTN, peripheral vascular disease, prior L occipital lobe ICH, SDH who presents with headache and L sided weakness. STAT CT Head demonstrates 27ml, multiloculated large R temporal lobe hematoma with R IVH, 99mm midline shift along with small overlying SDH.   OT comments  Pt seen for OT visit although no charge taken due to pt's level of consciousness and inability to participate in skilled therapy session. Pt sleeping upon therapy arrival. Attempted to wake patient with a variety of stimuli without success. Bed placed in Egress position and cold washcloth to face was applied. Continued to remain unresponsive. Pt was returned supine and repositioned in bed. Nursing aware of patient's performance during therapy session.   Recommendations for follow up therapy are one component of a multi-disciplinary discharge planning process, led by the attending physician.  Recommendations may be updated based on patient status, additional functional criteria and insurance authorization.    Assistance Recommended at Discharge Frequent or constant Supervision/Assistance  Patient can return home with the following  Other (comment) (Pt is not safe to return home at this level of care)   Equipment Recommendations  Other (comment) (defer to next venue of care)       Precautions / Restrictions Precautions Precautions: Fall Precaution Comments: L and posterior lean Restrictions Weight Bearing Restrictions: No              ADL either performed or assessed with clinical judgement      Cognition Arousal/Alertness: Lethargic, Suspect due to medications Behavior During Therapy: Flat affect Overall Cognitive Status: Impaired/Different from baseline Area of Impairment: Problem solving   Following Commands:  (did not  follow any commands)       General Comments: Pt remained lethargic. Did not wake up with painful stimuli to all 4 extremities, verbal stimuli, or sternal rubs.                   Pertinent Vitals/ Pain       Pain Assessment Facial Expression: Tense Body Movements: Absence of movements Muscle Tension: Relaxed Compliance with ventilator (intubated pts.): N/A Vocalization (extubated pts.): Sighing, moaning CPOT Total: 2 Pain Location: Once boosted up in bed, pt seemed to show signs of pain located on buttocks. Pt moaned and grabbed bed rail while trying to turn to side. Eyes remained closed.         Frequency  Min 2X/week        Progress Toward Goals  OT Goals(current goals can now be found in the care plan section)  Progress towards OT goals: Not progressing toward goals - comment (Pt lethargic during session limiting his ability to participate in therapy session)     Plan Frequency remains appropriate;Other (comment) (update discharge plan if patient is no longer appropriate for initial recommendation.)    Co-evaluation    PT/OT/SLP Co-Evaluation/Treatment: Yes Reason for Co-Treatment: Complexity of the patient's impairments (multi-system involvement);For patient/therapist safety   OT goals addressed during session: ADL's and self-care      AM-PAC OT "6 Clicks" Daily Activity     Outcome Measure   Help from another person eating meals?: Total Help from another person taking care of personal grooming?: Total Help from another person toileting, which includes using toliet, bedpan, or urinal?: Total Help from another person bathing (including washing, rinsing, drying)?: Total Help from another person to put  on and taking off regular upper body clothing?: Total Help from another person to put on and taking off regular lower body clothing?: Total 6 Click Score: 6    End of Session    OT Visit Diagnosis: Unsteadiness on feet (R26.81);Muscle weakness  (generalized) (M62.81);Ataxia, unspecified (R27.0);Other symptoms and signs involving cognitive function   Activity Tolerance Patient limited by lethargy   Patient Left in bed;with call bell/phone within reach;with family/visitor present   Nurse Communication Mobility status        Time: [id number] OT Time Calculation (min): 14 min  Charges: OT General Charges $OT Visit: 1 Visit  Ailene Ravel, OTR/L,CBIS  Supplemental OT - [facility] [facility] Secure Chat Preferred    Lasheena Frieze, Clarene Duke 01/03/2023, 2:03 PM

## 2023-01-03 NOTE — Progress Notes (Signed)
Physical Therapy Treatment Patient Details Name: [name] Marks MRN: [id number] DOB: 1934/10/26 Today's Date: 01/03/2023   History of Present Illness 88 y.o. male with PMH significant for HTN, peripheral vascular disease, prior L occipital lobe ICH, SDH who presents with headache and L sided weakness. STAT CT Head demonstrates 71ml, multiloculated large R temporal lobe hematoma with R IVH, 17mm midline shift along with small overlying SDH.    PT Comments    Session performed with OT to try to elicit arousal and participation in therapy services. Pt with decreased arousal and does not wake with noxious stimulus to all extremities, cold wash cloth, mobility/change in position, verbal stimulus or sternal rub this date. Placed bed in egress position and worked on bringing trunk off bed but this did not elicit any change. Pt with 1 instance of moaning when laying supine, trying to offload bottom, so repositioned onto left side with wedge. Pt not able to participate in therapy services. Family to decide about comfort/hospice transition on Monday. Will follow.    Recommendations for follow up therapy are one component of a multi-disciplinary discharge planning process, led by the attending physician.  Recommendations may be updated based on patient status, additional functional criteria and insurance authorization.  Follow Up Recommendations  Can patient physically be transported by private vehicle: No    Assistance Recommended at Discharge Frequent or constant Supervision/Assistance  Patient can return home with the following Two people to help with bathing/dressing/bathroom;Two people to help with walking and/or transfers;Assist for transportation;Assistance with cooking/housework;Help with stairs or ramp for entrance;Direct supervision/assist for financial management;Direct supervision/assist for medications management   Equipment Recommendations  Other (comment) (defer to next venue)     Recommendations for Other Services       Precautions / Restrictions Precautions Precautions: Fall Precaution Comments: L and posterior lean Restrictions Weight Bearing Restrictions: No     Mobility  Bed Mobility Overal bed mobility: Needs Assistance Bed Mobility: Supine to Sit     Supine to sit: HOB elevated, +2 for physical assistance, Total assist     General bed mobility comments: Bed placed in Egress position. Total A x2 to bring trunk off of bed x2 to try to elicit arousal. No improvement.    Transfers                        Ambulation/Gait                   Stairs             Wheelchair Mobility    Modified Rankin (Stroke Patients Only) Modified Rankin (Stroke Patients Only) Pre-Morbid Rankin Score: No symptoms Modified Rankin: Severe disability     Balance                                            Cognition Arousal/Alertness: Lethargic, Suspect due to medications Behavior During Therapy: Flat affect Overall Cognitive Status: Difficult to assess                                 General Comments: Not able to arouse pt despite efforts with noxious stimulus, verbal stimulus, mobility, sternal rub etc. Not following commands.        Exercises      General Comments General comments (  skin integrity, edema, etc.): VSS on RA throughout.      Pertinent Vitals/Pain Pain Assessment Pain Assessment: Faces Faces Pain Scale: No hurt Pain Location: Once boosted up in bed, pt seemed to show signs of pain located on buttocks. Pt moaned and grabbed bed rail while trying to turn to side. Eyes remained closed. Pain Descriptors / Indicators: Grimacing, Aching, Discomfort, Moaning Pain Intervention(s): Monitored during session, Repositioned    Home Living                          Prior Function            PT Goals (current goals can now be found in the care plan section) Progress towards  PT goals: Not progressing toward goals - comment (due to poor arousal)    Frequency    Min 3X/week      PT Plan Frequency needs to be updated;Discharge plan needs to be updated    Co-evaluation PT/OT/SLP Co-Evaluation/Treatment: Yes Reason for Co-Treatment: Complexity of the patient's impairments (multi-system involvement);For patient/therapist safety PT goals addressed during session: Mobility/safety with mobility OT goals addressed during session: ADL's and self-care      AM-PAC PT "6 Clicks" Mobility   Outcome Measure  Help needed turning from your back to your side while in a flat bed without using bedrails?: Total Help needed moving from lying on your back to sitting on the side of a flat bed without using bedrails?: Total Help needed moving to and from a bed to a chair (including a wheelchair)?: Total Help needed standing up from a chair using your arms (e.g., wheelchair or bedside chair)?: Total Help needed to walk in hospital room?: Total Help needed climbing 3-5 steps with a railing? : Total 6 Click Score: 6    End of Session   Activity Tolerance: Patient limited by fatigue Patient left: in bed;with call bell/phone within reach;with family/visitor present;with bed alarm set Nurse Communication: Mobility status PT Visit Diagnosis: Other abnormalities of gait and mobility (R26.89);Other symptoms and signs involving the nervous system (R29.898);Hemiplegia and hemiparesis Hemiplegia - Right/Left: Left Hemiplegia - dominant/non-dominant: Non-dominant Hemiplegia - caused by: Nontraumatic intracerebral hemorrhage     Time: [id number] PT Time Calculation (min) (ACUTE ONLY): 15 min  Charges:  $Therapeutic Activity: 8-22 mins                     Marisa Severin, PT, DPT Acute Rehabilitation Services Secure chat preferred Office [place] 01/03/2023, 3:06 PM

## 2023-01-03 NOTE — Progress Notes (Signed)
SLP Cancellation Note  Patient Details Name: HAWTHORN PATI MRN: [id number] DOB: 1935/08/07   Cancelled treatment:       Reason Eval/Treat Not Completed: Fatigue/lethargy limiting ability to participate. Touched base with RN who says that pt is not alert or interactive to be able to participate with SLP. She suggests we sign off. SLP will do so for now. Please reorder if needed.     Osie Bond., M.A. [place] Office [phone]  Secure chat preferred  01/03/2023, 2:30 PM

## 2023-01-04 DIAGNOSIS — G936 Cerebral edema: Secondary | ICD-10-CM

## 2023-01-04 DIAGNOSIS — I611 Nontraumatic intracerebral hemorrhage in hemisphere, cortical: Secondary | ICD-10-CM | POA: Diagnosis not present

## 2023-01-04 DIAGNOSIS — G935 Compression of brain: Secondary | ICD-10-CM | POA: Diagnosis not present

## 2023-01-04 LAB — CBC
HCT: 38.5 % — ABNORMAL LOW (ref 39.0–52.0)
Hemoglobin: 12.7 g/dL — ABNORMAL LOW (ref 13.0–17.0)
MCH: 32.6 pg (ref 26.0–34.0)
MCHC: 33 g/dL (ref 30.0–36.0)
MCV: 98.7 fL (ref 80.0–100.0)
Platelets: 182 10*3/uL (ref 150–400)
RBC: 3.9 MIL/uL — ABNORMAL LOW (ref 4.22–5.81)
RDW: 15.1 % (ref 11.5–15.5)
WBC: 10.5 10*3/uL (ref 4.0–10.5)
nRBC: 0 % (ref 0.0–0.2)

## 2023-01-04 LAB — BASIC METABOLIC PANEL
Anion gap: 11 (ref 5–15)
BUN: 51 mg/dL — ABNORMAL HIGH (ref 8–23)
CO2: 24 mmol/L (ref 22–32)
Calcium: 8.5 mg/dL — ABNORMAL LOW (ref 8.9–10.3)
Chloride: 123 mmol/L — ABNORMAL HIGH (ref 98–111)
Creatinine, Ser: 1.19 mg/dL (ref 0.61–1.24)
GFR, Estimated: 60 mL/min — ABNORMAL LOW (ref >60)
Glucose, Bld: 146 mg/dL — ABNORMAL HIGH (ref 70–99)
Potassium: 3.9 mmol/L (ref 3.5–5.1)
Sodium: 158 mmol/L — ABNORMAL HIGH (ref 135–145)

## 2023-01-04 LAB — GLUCOSE, CAPILLARY
Glucose-Capillary: 153 mg/dL — ABNORMAL HIGH (ref 70–99)
Glucose-Capillary: 124 mg/dL — ABNORMAL HIGH (ref 70–99)
Glucose-Capillary: 118 mg/dL — ABNORMAL HIGH (ref 70–99)
Glucose-Capillary: 142 mg/dL — ABNORMAL HIGH (ref 70–99)

## 2023-01-04 LAB — PHOSPHORUS: Phosphorus: 3.2 mg/dL (ref 2.5–4.6)

## 2023-01-04 LAB — MAGNESIUM: Magnesium: 2.5 mg/dL — ABNORMAL HIGH (ref 1.7–2.4)

## 2023-01-04 MED ORDER — ONDANSETRON HCL 4 MG/2ML IJ SOLN: 4.0000 mg | Freq: Four times a day (QID) | INTRAMUSCULAR | Status: DC | PRN

## 2023-01-04 MED ORDER — HALOPERIDOL LACTATE 2 MG/ML PO CONC: 0.5000 mg | ORAL | Status: DC | PRN

## 2023-01-04 MED ORDER — ACETAMINOPHEN 325 MG PO TABS: 650.0000 mg | ORAL_TABLET | Freq: Four times a day (QID) | ORAL | Status: DC | PRN

## 2023-01-04 MED ORDER — FREE WATER
200.0000 mL | Status: DC
  Administered 2023-01-04 (×2): 200 mL

## 2023-01-04 MED ORDER — HALOPERIDOL 0.5 MG PO TABS: 0.5000 mg | ORAL_TABLET | ORAL | Status: DC | PRN

## 2023-01-04 MED ORDER — GLYCOPYRROLATE 0.2 MG/ML IJ SOLN
0.2000 mg | INTRAMUSCULAR | Status: DC | PRN
  Administered 2023-01-04 – 2023-01-05 (×2): 0.2 mg via INTRAVENOUS
  Filled 2023-01-04: qty 1

## 2023-01-04 MED ORDER — GLYCOPYRROLATE 0.2 MG/ML IJ SOLN
0.1000 mg | INTRAMUSCULAR | Status: DC | PRN
  Administered 2023-01-04: 0.1 mg via INTRAVENOUS
  Filled 2023-01-04: qty 1

## 2023-01-04 MED ORDER — MORPHINE 100MG IN NS 100ML (1MG/ML) PREMIX INFUSION
INTRAVENOUS | Status: AC
  Filled 2023-01-04: qty 100

## 2023-01-04 MED ORDER — GLYCOPYRROLATE 0.2 MG/ML IJ SOLN
0.2000 mg | INTRAMUSCULAR | Status: DC | PRN
  Filled 2023-01-04: qty 1

## 2023-01-04 MED ORDER — ONDANSETRON 4 MG PO TBDP: 4.0000 mg | ORAL_TABLET | Freq: Four times a day (QID) | ORAL | Status: DC | PRN

## 2023-01-04 MED ORDER — MORPHINE SULFATE (PF) 2 MG/ML IV SOLN: 2.0000 mg | INTRAVENOUS | Status: DC | PRN

## 2023-01-04 MED ORDER — ACETAMINOPHEN 650 MG RE SUPP: 650.0000 mg | Freq: Four times a day (QID) | RECTAL | Status: DC | PRN

## 2023-01-04 MED ORDER — SCOPOLAMINE 1 MG/3DAYS TD PT72
1.0000 | MEDICATED_PATCH | TRANSDERMAL | Status: DC
  Administered 2023-01-04: 1.5 mg via TRANSDERMAL
  Filled 2023-01-04: qty 1

## 2023-01-04 MED ORDER — POLYVINYL ALCOHOL 1.4 % OP SOLN: 1.0000 [drp] | Freq: Four times a day (QID) | OPHTHALMIC | Status: DC | PRN

## 2023-01-04 MED ORDER — GLYCOPYRROLATE 1 MG PO TABS: 1.0000 mg | ORAL_TABLET | ORAL | Status: DC | PRN

## 2023-01-04 MED ORDER — MORPHINE 100MG IN NS 100ML (1MG/ML) PREMIX INFUSION
1.0000 mg/h | INTRAVENOUS | Status: DC
  Administered 2023-01-04: 2 mg/h via INTRAVENOUS
  Administered 2023-01-05: 5 mg/h via INTRAVENOUS
  Filled 2023-01-04: qty 100

## 2023-01-04 MED ORDER — BIOTENE DRY MOUTH MT LIQD: 15.0000 mL | OROMUCOSAL | Status: DC | PRN

## 2023-01-04 MED ORDER — MORPHINE SULFATE (PF) 2 MG/ML IV SOLN
2.0000 mg | INTRAVENOUS | Status: DC | PRN
  Administered 2023-01-04 (×3): 2 mg via INTRAVENOUS
  Filled 2023-01-04 (×3): qty 1

## 2023-01-04 MED ORDER — HALOPERIDOL LACTATE 5 MG/ML IJ SOLN: 0.5000 mg | INTRAMUSCULAR | Status: DC | PRN

## 2023-01-04 NOTE — Plan of Care (Signed)
  Problem: Health Behavior/Discharge Planning: Goal: Goals will be collaboratively established with patient/family Outcome: Progressing   Problem: Fluid Volume: Goal: Ability to maintain a balanced intake and output will improve Outcome: Progressing   Problem: Nutritional: Goal: Progress toward achieving an optimal weight will improve Outcome: Progressing   Problem: Skin Integrity: Goal: Risk for impaired skin integrity will decrease Outcome: Progressing   Problem: Education: Goal: Knowledge of disease or condition will improve Outcome: Not Progressing Goal: Knowledge of secondary prevention will improve (MUST DOCUMENT ALL) Outcome: Not Progressing Goal: Knowledge of patient specific risk factors will improve [place] N/A or DELETE if not current risk factor) Outcome: Not Progressing   Problem: Intracerebral Hemorrhage Tissue Perfusion: Goal: Complications of Intracerebral Hemorrhage will be minimized Outcome: Not Progressing   Problem: Coping: Goal: Will verbalize positive feelings about self Outcome: Not Progressing Goal: Will identify appropriate support needs Outcome: Not Progressing   Problem: Health Behavior/Discharge Planning: Goal: Ability to manage health-related needs will improve Outcome: Not Progressing   Problem: Self-Care: Goal: Ability to participate in self-care as condition permits will improve Outcome: Not Progressing Goal: Verbalization of feelings and concerns over difficulty with self-care will improve Outcome: Not Progressing Goal: Ability to communicate needs accurately will improve Outcome: Not Progressing   Problem: Nutrition: Goal: Risk of aspiration will decrease Outcome: Not Progressing Goal: Dietary intake will improve Outcome: Not Progressing   Problem: Education: Goal: Knowledge of disease or condition will improve Outcome: Not Progressing Goal: Knowledge of secondary prevention will improve (MUST DOCUMENT ALL) Outcome: Not  Progressing Goal: Knowledge of patient specific risk factors will improve [place] N/A or DELETE if not current risk factor) Outcome: Not Progressing   Problem: Ischemic Stroke/TIA Tissue Perfusion: Goal: Complications of ischemic stroke/TIA will be minimized Outcome: Not Progressing   Problem: Coping: Goal: Will verbalize positive feelings about self Outcome: Not Progressing Goal: Will identify appropriate support needs Outcome: Not Progressing   Problem: Health Behavior/Discharge Planning: Goal: Ability to manage health-related needs will improve Outcome: Not Progressing Goal: Goals will be collaboratively established with patient/family Outcome: Not Progressing   Problem: Self-Care: Goal: Ability to participate in self-care as condition permits will improve Outcome: Not Progressing Goal: Verbalization of feelings and concerns over difficulty with self-care will improve Outcome: Not Progressing Goal: Ability to communicate needs accurately will improve Outcome: Not Progressing   Problem: Nutrition: Goal: Risk of aspiration will decrease Outcome: Not Progressing Goal: Dietary intake will improve Outcome: Not Progressing   Problem: Education: Goal: Ability to describe self-care measures that may prevent or decrease complications (Diabetes Survival Skills Education) will improve Outcome: Not Progressing Goal: Individualized Educational Video(s) Outcome: Not Progressing   Problem: Coping: Goal: Ability to adjust to condition or change in health will improve Outcome: Not Progressing   Problem: Health Behavior/Discharge Planning: Goal: Ability to identify and utilize available resources and services will improve Outcome: Not Progressing Goal: Ability to manage health-related needs will improve Outcome: Not Progressing   Problem: Nutritional: Goal: Maintenance of adequate nutrition will improve Outcome: Not Progressing   Problem: Tissue Perfusion: Goal: Adequacy of  tissue perfusion will improve Outcome: Not Progressing

## 2023-01-04 NOTE — Progress Notes (Signed)
STROKE TEAM PROGRESS NOTE   INTERVAL HISTORY Patient's wife and daughters are at bedside. Pt lying in bed, eyes open but not responsive, not blinking not tracking, no spontaneous movement. Increased WOB and gurgling sound in the throat. Off precedex. Had long discussion with wife and daughter, they are in agreement with some meds for respiratory comfort but would like to continue other meds until Monday. I will start morphine PRN.    Vitals:   01/04/23 0800 01/04/23 0900 01/04/23 1000 01/04/23 1100  BP: 118/61 137/65 128/61 (!) 147/65  Pulse: 82 84 76 82  Resp: 14 14 16 18   Temp: 97.8 F (36.6 C)     TempSrc: Axillary     SpO2: 97% 97% 94% 92%  Weight:      Height:       CBC:  Recent Labs  Lab 12/12/2022 2027 12/12/2022 2032 01/03/23 0525 01/04/23 0454  WBC 12.8*   < > 13.2* 10.5  NEUTROABS 10.3*  --   --   --   HGB 13.0   < > 12.3* 12.7*  HCT 38.6*   < > 36.2* 38.5*  MCV 97.7   < > 98.1 98.7  PLT 211   < > 167 182   < > = values in this interval not displayed.   Basic Metabolic Panel:  Recent Labs  Lab 01/03/23 0525 01/03/23 1758 01/04/23 0454  NA 157*  --  158*  K 3.1*  --  3.9  CL 120*  --  123*  CO2 22  --  24  GLUCOSE 161*  --  146*  BUN 38*  --  51*  CREATININE 1.03  --  1.19  CALCIUM 8.2*  --  8.5*  MG 2.3 2.5* 2.5*  PHOS 2.2* 4.5 3.2   Lipid Panel:  Recent Labs  Lab 12/31/22 1520  CHOL 128  TRIG 42  HDL 59  CHOLHDL 2.2  VLDL 8  LDLCALC 61   HgbA1c:  Recent Labs  Lab 12/31/22 1025  HGBA1C 5.8*   Urine Drug Screen:  Recent Labs  Lab 12/31/22 0204  LABOPIA NONE DETECTED  COCAINSCRNUR NONE DETECTED  LABBENZ NONE DETECTED  AMPHETMU NONE DETECTED  THCU NONE DETECTED  LABBARB NONE DETECTED    Alcohol Level  Recent Labs  Lab 12/29/2022 2219  ETH <10    IMAGING past 24 hours No results found.  PHYSICAL EXAM  Temp:  [97.8 F (36.6 C)-99.6 F (37.6 C)] 97.8 F (36.6 C) (03/30 0800) Pulse Rate:  [58-84] 82 (03/30 1100) Resp:  [8-21]  18 (03/30 1100) BP: (101-178)/(54-98) 147/65 (03/30 1100) SpO2:  [92 %-98 %] 92 % (03/30 1100)  General - Well nourished, well developed elderly Caucasian male, in mild to moderate respiratory distress. Cardiovascular - Regular rhythm and rate. Neuro - eyes spontaneously open, but not responsive, not following commands, not tracking or blinking to visual threat. Eyes midline, pupils 59mm bilaterally, sluggish to light. Mild left facial droop. Tongue protrusion not cooperative. Bilateral UEs no movement with painful stimuli. Bilaterally LEs mild withdraw to pain, right stronger than left. Sensation, coordination and gait not tested.    ASSESSMENT/PLAN Mr.Eivan WILLIAMSON WHITTED is a 88 y.o. male with PMH significant for HTN, peripheral vascular disease, prior L occipital lobe ICH, SDH who presents with headache and L sided weakness. He was found to have 20ml, multiloculated large R temporal lobe hematoma with R IVH, 48mm midline shift along with small overlying SDH. No trauma, not on AC. No obvious amyloidosis noted on  prior MRI from 2020. Was hypertensive to 190s at one point but BP spontaneously down to [id number] systolic prior to any intervention. Patient previously seen in 2020 for acute left occipital lobe hemorrhage, followed up with Dr. Leonie Man in his office.  Cerebral angio was ordered to explore etiology of AVM versus thrombosed vein.  No definitive aneurysm or abnormal blood vessel which could have caused brain hemorrhage was discovered during this procedure.   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 Intraventricular extension into the right lateral ventricle. Leftward midline shift of 6 mm. CTA head & neck: No emergent large vessel occlusion or hemodynamically significant stenosis of the head or  neck. CT venogram: No dural venous sinus thrombosis  MRI: Unchanged large area of hemorrhage in the right temporal lobe with overlying subdural and subarachnoid blood. Unchanged 5 mm leftward midline shift. Stippled contrast enhancement in the right temporal lobe, possibly due to blood brain barrier breakdown following hemorrhage. 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 Repeat CT 3/26:Unchanged hematoma with small volume overlying subarachnoid and subdural blood. There is now a small amount of subarachnoid blood visible over the posterior left hemisphere. Blood within both lateral ventricles, slightly increased. Unchanged 5 mm leftward midline shift Repeat CT head 3/27: Unchanged right temporal lobe hemorrhage with IVH and subarachnoid extension with 4 mm shift 2D Echo: LVEF greater than 75%  LDL 128 HgbA1c 5.8 VTE prophylaxis - subcu heparin aspirin 81 mg daily prior to admission, now on No antithrombotic due to ICH/IVH. Patient has now had 2 ICHs, would recommend patient never be placed on blood thinners.  Therapy recommendations:  pending Disposition:  pending.  Palliative care on board, family leaning towards comfort, but would like to make decision on Monday  Cerebral edema 3% Na @ 42ml/hr, currently off Na checks Q6h, Na goal 150-155 Sodium 159->158 PICC line placed On TF and FW CT 3/27 showed MLS 71mm  Hx of [place] 05/2019 admitted for right visual field loss found to have left occipital loabe ICH. Repeat MRI w/wo contrast possible abnormality therefore proceeded with diagnostic cerebral angiogram on 09/14/2019 which did show non opacification of the right transverse sinus and its distal 2/3 which may represent either severe stenosis versus occlusion and recommended follow-up CTA head/neck 3 months post cerebral angiogram. CTA head/neck 01/27/2020 no intracranial arterial occlusion or high-grade stenosis. Continued aspirin 81 mg daily and simvastatin 40 mg daily for secondary stroke  prevention. Follows with [facility] stroke [facility] as outpt  Intermittent Agitation, resolved Off Precedex now  D/c seroquel as pt not responsive now D/c depakote today as pt not responsive Likely worsening mental status due to cerebral edema and brain herniation  Respiratory distress Increased WOB and gurgling Decreased mental status Worsening neuro/medical condition Had long discussion with wife and daughter, they are in agreement with some meds for respiratory comfort but would like to continue other meds until Monday. I will start morphine PRN.  Hypertensive Emergency Home meds: Losartan 100 mg, metoprolol 25 mg Stable SBP Goal  <160 Labetalol, Hydralazine IV PRNs  Currently on losartan 100 mg, metoprolol 25 mg twice daily Long-term BP goal normotensive  Hyperlipidemia Home meds: Simvastatin 40 mg LDL 128., goal < 70 Off statin given possible CAA bleed Not SATURN candidate given likely comfort care  Dysphagia  Core track tube placed  Continue  tube feeds and FW Speech therapy to follow  Other Stroke Risk Factors Advanced Age >/= 44  Former Cigarette smoker  Other Active Problems Oliguria/urinary retention Unable to pass in/out catheter Coude catheter placed Strict I&Os Start Cardura 2 mg Hypokalemia Hypophosphatemia  Hospital day # 5  This patient is critically ill due to large right temporal ICH and IVH, cerebral edema, respiratory failure and at significant risk of neurological worsening, death form brain herniation, brain death, respiratory failure. This patient's care requires constant monitoring of vital signs, hemodynamics, respiratory and cardiac monitoring, review of multiple databases, neurological assessment, discussion with family, other specialists and medical decision making of high complexity. I spent 45 minutes of neurocritical care time in the care of this patient. I had long discussion with wife and daughter at bedside, updated pt current condition,  treatment plan and potential prognosis, and answered all the questions. They expressed understanding and appreciation.    Rosalin Hawking, MD PhD Stroke Neurology 01/04/2023 11:17 AM

## 2023-01-04 NOTE — Plan of Care (Signed)
Pt seems in actively dying status, I have discussed with daughter over the phone, she has her sister and mom with her. They all decided to pursue comfort care measures in the current situation. Their questions were answered and will start comfort care measures. Will d/c cortrak and start morphine drip. Transfer to 6N once bed available.   Rosalin Hawking, MD PhD Stroke Neurology 01/04/2023 6:20 PM

## 2023-01-05 DIAGNOSIS — E854 Organ-limited amyloidosis: Secondary | ICD-10-CM

## 2023-01-05 DIAGNOSIS — I680 Cerebral amyloid angiopathy: Secondary | ICD-10-CM | POA: Diagnosis not present

## 2023-01-05 DIAGNOSIS — I611 Nontraumatic intracerebral hemorrhage in hemisphere, cortical: Secondary | ICD-10-CM | POA: Diagnosis not present

## 2023-01-06 NOTE — Progress Notes (Signed)
STROKE TEAM PROGRESS NOTE   INTERVAL HISTORY Patient's wife and daughters are at bedside. Pt lying in bed, in comfort care measures with eyes closed, breathing less labored and more comfortable than yesterday. On morphine drip. No significant distress. No spontaneous movement.    Vitals:   12/24/2022 0600 12/19/2022 0700 12/27/2022 0730 12/14/2022 0800  BP:      Pulse: (!) 111 (!) 114 (!) 118 (!) 116  Resp: 13 13 13 13   Temp:      TempSrc:      SpO2: (!) 89% (!) 89% (!) 89% 90%  Weight:      Height:       CBC:  Recent Labs  Lab 01/01/2023 2027 12/18/2022 2032 01/03/23 0525 01/04/23 0454  WBC 12.8*   < > 13.2* 10.5  NEUTROABS 10.3*  --   --   --   HGB 13.0   < > 12.3* 12.7*  HCT 38.6*   < > 36.2* 38.5*  MCV 97.7   < > 98.1 98.7  PLT 211   < > 167 182   < > = values in this interval not displayed.   Basic Metabolic Panel:  Recent Labs  Lab 01/03/23 0525 01/03/23 1758 01/04/23 0454  NA 157*  --  158*  K 3.1*  --  3.9  CL 120*  --  123*  CO2 22  --  24  GLUCOSE 161*  --  146*  BUN 38*  --  51*  CREATININE 1.03  --  1.19  CALCIUM 8.2*  --  8.5*  MG 2.3 2.5* 2.5*  PHOS 2.2* 4.5 3.2   Lipid Panel:  Recent Labs  Lab 12/31/22 1520  CHOL 128  TRIG 42  HDL 59  CHOLHDL 2.2  VLDL 8  LDLCALC 61   HgbA1c:  Recent Labs  Lab 12/31/22 1025  HGBA1C 5.8*   Urine Drug Screen:  Recent Labs  Lab 12/31/22 0204  LABOPIA NONE DETECTED  COCAINSCRNUR NONE DETECTED  LABBENZ NONE DETECTED  AMPHETMU NONE DETECTED  THCU NONE DETECTED  LABBARB NONE DETECTED    Alcohol Level  Recent Labs  Lab 12/11/2022 2219  ETH <10    IMAGING past 24 hours No results found.  PHYSICAL EXAM  Temp:  [97.7 F (36.5 C)-102.1 F (38.9 C)] 97.7 F (36.5 C) (03/31 0400) Pulse Rate:  [82-132] 116 (03/31 0800) Resp:  [10-22] 13 (03/31 0800) BP: (99-147)/(45-65) 104/45 (03/30 2149) SpO2:  [81 %-97 %] 90 % (03/31 0800)  General - Well nourished, well developed elderly Caucasian male, no  significant respiratory distress. Neuro - limited exam due to comfort care measures. Eyes closed, not open eyes on voice, not following commands. No spontaneous movement.     ASSESSMENT/PLAN [name] Marks is a 88 y.o. male with PMH significant for HTN, peripheral vascular disease, prior L occipital lobe ICH, SDH who presents with headache and L sided weakness. He was found to have 33ml, multiloculated large R temporal lobe hematoma with R IVH, 33mm midline shift along with small overlying SDH. No trauma, not on AC. No obvious amyloidosis noted on prior MRI from 2020. Was hypertensive to 190s at one point but BP spontaneously down to [id number] systolic prior to any intervention. Patient previously seen in 2020 for acute left occipital lobe hemorrhage, followed up with Dr. Leonie Man in his office.  Cerebral angio was ordered to explore etiology of AVM versus thrombosed vein.  No definitive aneurysm or abnormal blood vessel which could have caused brain hemorrhage  was discovered during this procedure.   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 Intraventricular extension into the right lateral ventricle. Leftward midline shift of 6 mm. CTA head & neck: No emergent large vessel occlusion or hemodynamically significant stenosis of the head or neck. CT venogram: No dural venous sinus thrombosis  MRI: Unchanged large area of hemorrhage in the right temporal lobe with overlying subdural and subarachnoid blood. Unchanged 5 mm leftward midline shift. Stippled contrast enhancement in the right temporal lobe, possibly due to blood brain barrier breakdown following hemorrhage. 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 Repeat CT 3/26:Unchanged hematoma with small volume overlying subarachnoid and subdural blood.  There is now a small amount of subarachnoid blood visible over the posterior left hemisphere. Blood within both lateral ventricles, slightly increased. Unchanged 5 mm leftward midline shift Repeat CT head 3/27: Unchanged right temporal lobe hemorrhage with IVH and subarachnoid extension with 4 mm shift 2D Echo: LVEF greater than 75%  LDL 128 HgbA1c 5.8 VTE prophylaxis - none for comfort care aspirin 81 mg daily prior to admission, now in comfort care measures.  Disposition:  expecting hospital death  Cerebral edema 3% Na @ 39ml/hr, currently off Sodium 159->158 PICC line placed CT 3/27 showed MLS 72mm  Hx of ICH 05/2019 admitted for right visual field loss found to have left occipital loabe ICH. Repeat MRI w/wo contrast possible abnormality therefore proceeded with diagnostic cerebral angiogram on 09/14/2019 which did show non opacification of the right transverse sinus and its distal 2/3 which may represent either severe stenosis versus occlusion and recommended follow-up CTA head/neck 3 months post cerebral angiogram. CTA head/neck 01/27/2020 no intracranial arterial occlusion or high-grade stenosis. Continued aspirin 81 mg daily and simvastatin 40 mg daily for secondary stroke prevention. Follows with [facility] stroke clinic as outpt  Intermittent Agitation, resolved Off Precedex now, on morphine drip D/c'ed seroquel  D/c'ed depakote   Respiratory distress Increased WOB and gurgling -> comfort now with morphine-> improved Had long discussion with wife and daughter, they are in agreement with comfort care measures  Hypertensive Emergency Home meds: Losartan 100 mg, metoprolol 25 mg Stable  Now in comfort care  Hyperlipidemia Home meds: Simvastatin 40 mg LDL 128 Off statin given possible CAA bleed Not SATURN candidate given comfort care  Dysphagia  Core track tube placed  -> removed for comfort  Other Stroke Risk Factors Advanced Age >/= 60  Former Cigarette smoker  Other Active  Problems Oliguria/urinary retention Unable to pass in/out catheter Coude catheter placed Hypokalemia Hypophosphatemia  Hospital day # 6   Rosalin Hawking, MD PhD Stroke Neurology 01/01/2023 10:23 AM

## 2023-01-06 NOTE — Progress Notes (Signed)
Time of death 67.  Pronounce by TK C and [place] [facility] at bedside. Attending MD notified.  [place] notified.

## 2023-01-06 NOTE — Progress Notes (Signed)
This RN spoke with [name] Marks at [facility] to start pt referral process for donation. Was informed to please call back with pt cardiac time of death.   Pt [place] #: [id number]

## 2023-01-06 NOTE — Death Summary Note (Signed)
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 year old male with PMH significant for HTN, peripheral vascular disease, prior L occipital lobe ICH, SDH who presents with headache and L sided weakness. He was found to have 44ml, multiloculated large R temporal lobe hematoma with R IVH, 43mm midline shift along with small overlying SDH. No trauma, not on AC. No obvious amyloidosis noted on prior MRI from 01-16-2019. Was hypertensive to 190s at one point but BP spontaneously down to [id number] systolic prior to any intervention. Patient previously seen in 01-16-2019 for acute left occipital lobe hemorrhage, followed up with Dr. Leonie Man in his office.  Cerebral angio was ordered to explore etiology of AVM versus thrombosed vein.  No definitive aneurysm or abnormal blood vessel which could have caused brain hemorrhage was discovered during this procedure.    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 Intraventricular extension  into the right lateral ventricle. Leftward midline shift of 6 mm. CTA head & neck: No emergent large vessel occlusion or hemodynamically significant stenosis of the head or neck. CT venogram: No dural venous sinus thrombosis  MRI: Unchanged large area of hemorrhage in the right temporal lobe with overlying subdural and subarachnoid blood. Unchanged 5 mm leftward midline shift. Stippled contrast enhancement in the right temporal lobe, possibly due to blood brain barrier breakdown following hemorrhage. 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 Repeat CT 3/26:Unchanged hematoma with small volume overlying subarachnoid and subdural blood. There is now a small amount of subarachnoid blood visible over the posterior left hemisphere. Blood within both lateral ventricles, slightly increased. Unchanged 5 mm leftward midline shift Repeat CT head 3/27: Unchanged right temporal lobe hemorrhage with IVH and subarachnoid extension with 4 mm shift 2D Echo: LVEF greater than 75%  LDL 128 HgbA1c 5.8 VTE prophylaxis - none for comfort care aspirin 81 mg daily prior to admission, now in comfort care measures.  Disposition:  hospital death, TOD 1830 07-Jan-2023   Cerebral edema 3% Na @ 54ml/hr, currently off Sodium 159->158 PICC line placed CT 3/27 showed MLS 58mm   Hx of ICH 05/2019 admitted for right visual field loss found to have left occipital loabe ICH. Repeat MRI w/wo contrast possible abnormality therefore proceeded with diagnostic cerebral angiogram on 09/14/2019 which did show non opacification of the right transverse sinus and its distal 2/3 which may represent either severe stenosis versus occlusion and recommended follow-up CTA head/neck 3 months post cerebral angiogram. CTA head/neck 01/27/2020 no intracranial arterial occlusion or high-grade stenosis. Continued aspirin 81 mg daily and simvastatin 40 mg daily for secondary stroke prevention. Follows with [facility]  stroke clinic as outpt   Intermittent Agitation,  resolved Off Precedex now, on morphine drip D/c'ed seroquel  D/c'ed depakote    Respiratory distress Increased WOB and gurgling -> comfort now with morphine-> improved Had long discussion with wife and daughter, they are in agreement with comfort care measures   Hypertensive Emergency Home meds: Losartan 100 mg, metoprolol 25 mg Stable  Now in comfort care   Hyperlipidemia Home meds: Simvastatin 40 mg LDL 128 Off statin given possible CAA bleed Not SATURN candidate given comfort care   Dysphagia  Core track tube placed  -> removed for comfort   Other Stroke Risk Factors Advanced Age >/= 33  Former Cigarette smoker   Other Active Problems Oliguria/urinary retention Unable to pass in/out catheter Coude catheter placed Hypokalemia Hypophosphatemia    Pertinent Labs and Studies  Significant Diagnostic Studies DG Abd Portable 1V  Result Date: 01/01/2023 CLINICAL DATA:  Enteric tube placement EXAM: PORTABLE ABDOMEN - 1 VIEW COMPARISON:  None Available. FINDINGS: Enteric tube tip projects over the proximal small bowel. Partially imaged nonobstructive bowel gas pattern. IMPRESSION: Enteric tube tip projects over the proximal small bowel. Electronically Signed   By: Darrin Nipper M.D.   On: 01/01/2023 13:23   CT HEAD WO CONTRAST (5MM)  Result Date: 01/01/2023 CLINICAL DATA:  Stroke follow-up EXAM: CT HEAD WITHOUT CONTRAST TECHNIQUE: Contiguous axial images were obtained from the base of the skull through the vertex without intravenous contrast. RADIATION DOSE REDUCTION: This exam was performed according to the departmental dose-optimization program which includes automated exposure control, adjustment of the mA and/or kV according to patient size and/or use of iterative reconstruction technique. COMPARISON:  Yesterday FINDINGS: Brain: Extensive patchy hemorrhage in the right temporal lobe, unchanged in extent and shape. Patchy subarachnoid hemorrhage along the cerebral convexities is  unchanged. Intraventricular blood clot layering in the lateral ventricles. No hydrocephalus. Unchanged brain swelling with leftward shift measuring 4 mm. Chronic left occipital infarct. Vascular: No hyperdense vessel or unexpected calcification. Skull: Normal. Negative for fracture or focal lesion. Sinuses/Orbits: No acute finding. IMPRESSION: Unchanged extensive hemorrhage in the right temporal lobe with intraventricular and subarachnoid extension. Midline shift towards the left measures 4 mm. No hydrocephalus. Electronically Signed   By: Jorje Guild M.D.   On: 01/01/2023 04:54   ECHOCARDIOGRAM COMPLETE  Result Date: 12/31/2022    ECHOCARDIOGRAM REPORT   Patient Name:   [name] Marks Date of Exam: 12/31/2022 Medical Rec #:  [id number]        Height:       70.0 in Accession #:    [id number]       Weight:       215.6 lb Date of Birth:  [birth year]         BSA:          2.155 m Patient Age:    44 years         BP:           102/65 mmHg Patient Gender: M                HR:           104 bpm. Exam Location:  Inpatient Procedure: 2D Echo, Cardiac Doppler and Color Doppler Indications:    Stroke I63.9  History:        Patient has prior history of Echocardiogram examinations, most                 recent 03/01/2021. Angina and CAD, Signs/Symptoms:Chest  Pain;                 Risk Factors:Hypertension, Sleep Apnea and Dyslipidemia.  Sonographer:    Ronny Flurry Referring Phys: [id number] Lindy  1. Left ventricular ejection fraction, by estimation, is >75%. The left ventricle has hyperdynamic function. The left ventricle has no regional wall motion abnormalities. Indeterminate diastolic filling due to E-A fusion.  2. Right ventricular systolic function is hyperdynamic. The right ventricular size is normal. There is mildly elevated pulmonary artery systolic pressure.  3. The mitral valve is normal in structure. No evidence of mitral valve regurgitation. No evidence of mitral stenosis.  4. The  aortic valve is tricuspid. There is mild thickening of the aortic valve. Aortic valve regurgitation is not visualized. Aortic valve sclerosis is present, with no evidence of aortic valve stenosis.  5. The inferior vena cava is normal in size with greater than 50% respiratory variability, suggesting right atrial pressure of 3 mmHg. FINDINGS  Left Ventricle: Left ventricular ejection fraction, by estimation, is >75%. The left ventricle has hyperdynamic function. The left ventricle has no regional wall motion abnormalities. The left ventricular internal cavity size was normal in size. There is no left ventricular hypertrophy. Indeterminate diastolic filling due to E-A fusion. Right Ventricle: The right ventricular size is normal. No increase in right ventricular wall thickness. Right ventricular systolic function is hyperdynamic. There is mildly elevated pulmonary artery systolic pressure. The tricuspid regurgitant velocity is 3.08 m/s, and with an assumed right atrial pressure of 3 mmHg, the estimated right ventricular systolic pressure is [id number] mmHg. Left Atrium: Left atrial size was normal in size. Right Atrium: Right atrial size was normal in size. Pericardium: There is no evidence of pericardial effusion. Mitral Valve: The mitral valve is normal in structure. No evidence of mitral valve regurgitation. No evidence of mitral valve stenosis. Tricuspid Valve: The tricuspid valve is normal in structure. Tricuspid valve regurgitation is trivial. No evidence of tricuspid stenosis. Aortic Valve: The aortic valve is tricuspid. There is mild thickening of the aortic valve. Aortic valve regurgitation is not visualized. Aortic valve sclerosis is present, with no evidence of aortic valve stenosis. Aortic valve mean gradient measures 10.0 mmHg. Aortic valve peak gradient measures 18.3 mmHg. Aortic valve area, by VTI measures 2.05 cm. Pulmonic Valve: The pulmonic valve was normal in structure. Pulmonic valve regurgitation is not  visualized. No evidence of pulmonic stenosis. Aorta: The aortic root is normal in size and structure. Venous: The inferior vena cava is normal in size with greater than 50% respiratory variability, suggesting right atrial pressure of 3 mmHg. IAS/Shunts: No atrial level shunt detected by color flow Doppler.  LEFT VENTRICLE PLAX 2D LVIDd:         4.60 cm   Diastology LVIDs:         3.00 cm   LV e' medial:   17.60 cm/s LV PW:         1.10 cm   LV E/e' medial: 9.0 LV IVS:        1.00 cm LVOT diam:     2.00 cm LV SV:         72 LV SV Index:   33 LVOT Area:     3.14 cm  RIGHT VENTRICLE             IVC RV S prime:     22.20 cm/s  IVC diam: 1.80 cm TAPSE (M-mode): 1.8 cm LEFT ATRIUM  Index        RIGHT ATRIUM           Index LA diam:        3.90 cm 1.81 cm/m   RA Area:     15.90 cm LA Vol (A2C):   28.0 ml 12.99 ml/m  RA Volume:   30.30 ml  14.06 ml/m LA Vol (A4C):   55.6 ml 25.80 ml/m LA Biplane Vol: 41.8 ml 19.40 ml/m  AORTIC VALVE AV Area (Vmax):    2.22 cm AV Area (Vmean):   2.17 cm AV Area (VTI):     2.05 cm AV Vmax:           214.00 cm/s AV Vmean:          147.000 cm/s AV VTI:            0.349 m AV Peak Grad:      18.3 mmHg AV Mean Grad:      10.0 mmHg LVOT Vmax:         151.33 cm/s LVOT Vmean:        101.667 cm/s LVOT VTI:          0.228 m LVOT/AV VTI ratio: 0.65  AORTA Ao Root diam: 3.30 cm Ao Asc diam:  3.60 cm MV E velocity: 158.00 cm/s  TRICUSPID VALVE                             TR Peak grad:   37.9 mmHg                             TR Vmax:        308.00 cm/s                              SHUNTS                             Systemic VTI:  0.23 m                             Systemic Diam: 2.00 cm Dani Gobble Croitoru MD Electronically signed by Sanda Klein MD Signature Date/Time: 12/31/2022/1:28:52 PM    Final    [place] EKG SITE RITE  Result Date: 12/31/2022 If Site Rite image not attached, placement could not be confirmed due to current cardiac rhythm.  CT HEAD WO CONTRAST  Result Date:  12/31/2022 CLINICAL DATA:  Hemorrhagic stroke follow-up EXAM: CT HEAD WITHOUT CONTRAST TECHNIQUE: Contiguous axial images were obtained from the base of the skull through the vertex without intravenous contrast. RADIATION DOSE REDUCTION: This exam was performed according to the departmental dose-optimization program which includes automated exposure control, adjustment of the mA and/or kV according to patient size and/or use of iterative reconstruction technique. COMPARISON:  12/31/2022 FINDINGS: Brain: Unchanged appearance of large intraparenchymal hematoma centered in the right temporal lobe. Small volume overlying subarachnoid and subdural blood. There is now a small amount of subarachnoid blood visible over the posterior left hemisphere. Blood within both lateral ventricles, slightly increased. Unchanged 5 mm leftward midline shift. Vascular: Atherosclerotic calcification of the internal carotid arteries at the skull base. Skull: Normal. Negative for fracture or focal lesion. Sinuses/Orbits: No acute finding. Other: None. IMPRESSION: 1. Unchanged appearance of large intraparenchymal  hematoma centered in the right temporal lobe with small volume overlying subarachnoid and subdural blood. 2. There is now a small amount of subarachnoid blood visible over the posterior left hemisphere. 3. Blood within both lateral ventricles, slightly increased. 4. Unchanged 5 mm leftward midline shift. Electronically Signed   By: Ulyses Jarred M.D.   On: 12/31/2022 03:54   MR BRAIN W WO CONTRAST  Result Date: 12/31/2022 CLINICAL DATA:  Transient ischemic attack EXAM: MRI HEAD WITHOUT AND WITH CONTRAST TECHNIQUE: Multiplanar, multiecho pulse sequences of the brain and surrounding structures were obtained without and with intravenous contrast. CONTRAST:  9.62mL GADAVIST GADOBUTROL 1 MMOL/ML IV SOLN COMPARISON:  08/11/2019 FINDINGS: Brain: Large area of hemorrhage in the right temporal lobe with overlying subdural and subarachnoid  blood. There is blood in the right lateral ventricle, unchanged. Unchanged 5 mm leftward midline shift. 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 There is stippled contrast enhancement in the right temporal lobe, possibly due to blood brain barrier breakdown following hemorrhage. The midline structures are normal. Vascular: Normal flow voids. Skull and upper cervical spine: Normal marrow signal. Sinuses/Orbits: Negative. Other: None. IMPRESSION: 1. Unchanged large area of hemorrhage in the right temporal lobe with overlying subdural and subarachnoid blood. 2. Unchanged 5 mm leftward midline shift. 3. Stippled contrast enhancement in the right temporal lobe, possibly due to blood brain barrier breakdown following hemorrhage. 4. 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 Electronically Signed   By: Ulyses Jarred M.D.   On: 12/31/2022 03:48   CT VENOGRAM HEAD  Result Date: 12/21/2022 CLINICAL DATA:  Hemorrhagic stroke EXAM: CT VENOGRAM HEAD TECHNIQUE: Venographic phase images of the brain were obtained following the administration of intravenous contrast. Multiplanar reformats and maximum intensity projections were generated. RADIATION DOSE REDUCTION: This exam was performed according to the departmental dose-optimization program which includes automated exposure control, adjustment of the mA and/or kV according to patient size and/or use of iterative reconstruction technique. CONTRAST:  15mL OMNIPAQUE IOHEXOL 350 MG/ML SOLN COMPARISON:  None Available. FINDINGS: Superior sagittal sinus: Normal. Straight sinus: Normal. Inferior sagittal sinus, vein of Galen and internal cerebral veins: Normal. Transverse sinuses: Normal. Sigmoid sinuses: Normal. Visualized jugular veins: Normal. IMPRESSION: No dural venous sinus thrombosis. Electronically Signed   By: Ulyses Jarred M.D.   On: 01/01/2023 22:30   CT ANGIO HEAD NECK W WO CM (CODE STROKE)  Result Date: 12/08/2022 CLINICAL DATA:  Left-sided weakness and facial EXAM:  CT ANGIOGRAPHY HEAD AND NECK TECHNIQUE: Multidetector CT imaging of the head and neck was performed using the standard protocol during bolus administration of intravenous contrast. Multiplanar CT image reconstructions and MIPs were obtained to evaluate the vascular anatomy. Carotid stenosis measurements (when applicable) are obtained utilizing NASCET criteria, using the distal internal carotid diameter as the denominator. RADIATION DOSE REDUCTION: This exam was performed according to the departmental dose-optimization program which includes automated exposure control, adjustment of the mA and/or kV according to patient size and/or use of iterative reconstruction technique. CONTRAST:  85mL OMNIPAQUE IOHEXOL 350 MG/ML SOLN COMPARISON:  None Available. FINDINGS: CTA NECK FINDINGS SKELETON: There is no bony spinal canal stenosis. No lytic or blastic lesion. OTHER NECK: Normal pharynx, larynx and major salivary glands. No cervical lymphadenopathy. Unremarkable thyroid gland. UPPER CHEST: No pneumothorax or pleural effusion. No nodules or masses. AORTIC ARCH: There is calcific atherosclerosis of the aortic arch. There is no aneurysm, dissection or hemodynamically significant stenosis of the visualized portion of the aorta. Conventional 3 vessel aortic branching pattern. The visualized proximal subclavian  arteries are widely patent. RIGHT CAROTID SYSTEM: No dissection, occlusion or aneurysm. Mild atherosclerotic calcification at the carotid bifurcation without hemodynamically significant stenosis. LEFT CAROTID SYSTEM: No dissection, occlusion or aneurysm. Mild atherosclerotic calcification at the carotid bifurcation without hemodynamically significant stenosis. VERTEBRAL ARTERIES: Left dominant configuration. Both origins are clearly patent. There is no dissection, occlusion or flow-limiting stenosis to the skull base (V1-V3 segments). CTA HEAD FINDINGS POSTERIOR CIRCULATION: --Vertebral arteries: Normal V4 segments.  --Inferior cerebellar arteries: Normal. --Basilar artery: Normal. --Superior cerebellar arteries: Normal. --Posterior cerebral arteries (PCA): Normal. ANTERIOR CIRCULATION: --Intracranial internal carotid arteries: Atherosclerotic calcification of the internal carotid arteries at the skull base without hemodynamically significant stenosis. --Anterior cerebral arteries (ACA): Normal. Both A1 segments are present. Patent anterior communicating artery (a-comm). --Middle cerebral arteries (MCA): Normal. VENOUS SINUSES: As permitted by contrast timing, patent. ANATOMIC VARIANTS: None Review of the MIP images confirms the above findings. IMPRESSION: 1. No emergent large vessel occlusion or hemodynamically significant stenosis of the head or neck. 2. Mild bilateral carotid bifurcation atherosclerosis without hemodynamically significant stenosis. 3. Unchanged size of right temporal intraparenchymal hemorrhage. No causative vascular lesion identified Aortic atherosclerosis (ICD10-I70.0). Electronically Signed   By: Ulyses Jarred M.D.   On: 12/31/2022 21:12   CT HEAD CODE STROKE WO CONTRAST  Result Date: 12/15/2022 CLINICAL DATA:  Code stroke.  Left-sided weakness and facial droop EXAM: CT HEAD WITHOUT CONTRAST TECHNIQUE: Contiguous axial images were obtained from the base of the skull through the vertex without intravenous contrast. RADIATION DOSE REDUCTION: This exam was performed according to the departmental dose-optimization program which includes automated exposure control, adjustment of the mA and/or kV according to patient size and/or use of iterative reconstruction technique. COMPARISON:  08/17/2020 FINDINGS: Brain: Large intraparenchymal hematoma centered in the right temporal lobe, measuring 6.2 x 3.2 x 3.5 cm (volume 35 mL). There is intraventricular extension into the right lateral ventricle. Small amount of subdural hematoma and subarachnoid blood overlying the primary hemorrhagic site. There is leftward  midline shift of 6 mm. Old infarct in the left PCA territory. Vascular: No abnormal hyperdensity of the major intracranial arteries or dural venous sinuses. No intracranial atherosclerosis. Skull: The visualized skull base, calvarium and extracranial soft tissues are normal. Sinuses/Orbits: No fluid levels or advanced mucosal thickening of the visualized paranasal sinuses. No mastoid or middle ear effusion. The orbits are normal. IMPRESSION: 1. Large intraparenchymal hematoma centered in the right temporal lobe, measuring 6.2 x 3.2 x 3.5 cm (volume 35 mL). 2. Intraventricular extension into the right lateral ventricle. 3. Leftward midline shift of 6 mm. Critical Value/emergent results were called by telephone at the time of interpretation on 12/31/2022 at 8:42 pm to provider [facility], who verbally acknowledged these results. Electronically Signed   By: Ulyses Jarred M.D.   On: 12/26/2022 20:43    Microbiology Recent Results (from the past 240 hour(s))  MRSA Next Gen by PCR, Nasal     Status: None   Collection Time: 12/26/2022 10:53 PM   Specimen: Nasal Mucosa; Nasal Swab  Result Value Ref Range Status   MRSA by PCR Next Gen NOT DETECTED NOT DETECTED Final    Comment: (NOTE) The GeneXpert MRSA Assay (FDA approved for NASAL specimens only), is one component of a comprehensive MRSA colonization surveillance program. It is not intended to diagnose MRSA infection nor to guide or monitor treatment for MRSA infections. Test performance is not FDA approved in patients less than 11 years old. Performed at [facility], [place] [address]  [place], [place] [address]     Lab Basic Metabolic Panel: Recent Labs  Lab 12/31/22 0405 12/31/22 1022 01/01/23 1648 01/01/23 1949 01/02/23 0601 01/02/23 1700 01/03/23 0525 01/03/23 1758 01/04/23 0454  NA 141   < > 157* 150* 159* 157* 157*  --  158*  K 4.4  --  3.3*  --  3.8  --  3.1*  --  3.9  CL 108  --  129*  --  124*  --  120*  --  123*  CO2  20*  --  22  --  22  --  22  --  24  GLUCOSE 184*  --  212*  --  152*  --  161*  --  146*  BUN 18  --  27*  --  42*  --  38*  --  51*  CREATININE 1.24  --  1.28*  --  1.37*  --  1.03  --  1.19  CALCIUM 8.6*  --  8.6*  --  8.4*  --  8.2*  --  8.5*  MG  --    < >  --   --  2.4 2.6* 2.3 2.5* 2.5*  PHOS  --    < >  --   --  2.6 2.5 2.2* 4.5 3.2   < > = values in this interval not displayed.   Liver Function Tests: Recent Labs  Lab 12/29/2022 2027 12/31/22 0405  AST 25 26  ALT 20 17  ALKPHOS 59 58  BILITOT 0.9 0.9  PROT 6.8 6.6  ALBUMIN 3.8 3.7   No results for input(s): "LIPASE", "AMYLASE" in the last 168 hours. No results for input(s): "AMMONIA" in the last 168 hours. CBC: Recent Labs  Lab 12/08/2022 2027 12/31/2022 2032 12/31/22 0405 01/02/23 0601 01/03/23 0525 01/04/23 0454  WBC 12.8*  --  13.0* 17.5* 13.2* 10.5  NEUTROABS 10.3*  --   --   --   --   --   HGB 13.0 13.3 12.3* 10.5* 12.3* 12.7*  HCT 38.6* 39.0 37.5* 33.5* 36.2* 38.5*  MCV 97.7  --  99.5 102.8* 98.1 98.7  PLT 211  --  206 171 167 182   Cardiac Enzymes: No results for input(s): "CKTOTAL", "CKMB", "CKMBINDEX", "TROPONINI" in the last 168 hours. Sepsis Labs: Recent Labs  Lab 12/31/22 0405 01/02/23 0601 01/03/23 0525 01/04/23 0454  WBC 13.0* 17.5* 13.2* 10.5    Procedures/Operations  Intubation and extubation   Rosalin Hawking 12/10/2022, 7:50 PM

## 2023-01-06 DEATH — deceased

## 2023-04-07 ENCOUNTER — Ambulatory Visit: Payer: Medicare PPO | Admitting: Cardiovascular Disease
